# Patient Record
Sex: Female | Born: 1940 | Race: White | Hispanic: No | Smoking: Never smoker
Health system: Southern US, Community
[De-identification: ages and names within clinical notes are randomized; demographics above are authoritative.]

## PROBLEM LIST (undated history)

## (undated) DIAGNOSIS — R011 Cardiac murmur, unspecified: Secondary | ICD-10-CM

## (undated) DIAGNOSIS — C801 Malignant (primary) neoplasm, unspecified: Secondary | ICD-10-CM

## (undated) DIAGNOSIS — J939 Pneumothorax, unspecified: Secondary | ICD-10-CM

## (undated) DIAGNOSIS — Z66 Do not resuscitate: Secondary | ICD-10-CM

## (undated) DIAGNOSIS — E785 Hyperlipidemia, unspecified: Secondary | ICD-10-CM

## (undated) DIAGNOSIS — S7290XA Unspecified fracture of unspecified femur, initial encounter for closed fracture: Secondary | ICD-10-CM

## (undated) DIAGNOSIS — I10 Essential (primary) hypertension: Secondary | ICD-10-CM

## (undated) DIAGNOSIS — K219 Gastro-esophageal reflux disease without esophagitis: Secondary | ICD-10-CM

## (undated) DIAGNOSIS — K5730 Diverticulosis of large intestine without perforation or abscess without bleeding: Secondary | ICD-10-CM

## (undated) DIAGNOSIS — N200 Calculus of kidney: Secondary | ICD-10-CM

## (undated) DIAGNOSIS — G43909 Migraine, unspecified, not intractable, without status migrainosus: Secondary | ICD-10-CM

## (undated) DIAGNOSIS — H269 Unspecified cataract: Secondary | ICD-10-CM

## (undated) HISTORY — DX: Malignant (primary) neoplasm, unspecified: C80.1

## (undated) HISTORY — DX: Calculus of kidney: N20.0

## (undated) HISTORY — DX: Do not resuscitate: Z66

## (undated) HISTORY — PX: TONSILLECTOMY: SHX5217

## (undated) HISTORY — DX: Pneumothorax, unspecified: J93.9

## (undated) HISTORY — PX: FEMUR FRACTURE SURGERY: SHX633

## (undated) HISTORY — DX: Unspecified fracture of unspecified femur, initial encounter for closed fracture: S72.90XA

## (undated) HISTORY — DX: Migraine, unspecified, not intractable, without status migrainosus: G43.909

## (undated) HISTORY — DX: Diverticulosis of large intestine without perforation or abscess without bleeding: K57.30

## (undated) HISTORY — DX: Cardiac murmur, unspecified: R01.1

## (undated) HISTORY — DX: Essential (primary) hypertension: I10

## (undated) HISTORY — DX: Hyperlipidemia, unspecified: E78.5

## (undated) HISTORY — DX: Unspecified cataract: H26.9

## (undated) HISTORY — PX: CATARACT EXTRACTION: SUR2

## (undated) HISTORY — PX: POLYPECTOMY: SHX149

## (undated) HISTORY — PX: OOPHORECTOMY: SHX86

## (undated) HISTORY — DX: Gastro-esophageal reflux disease without esophagitis: K21.9

---

## 1992-04-06 HISTORY — PX: ABDOMINAL HYSTERECTOMY: SHX81

## 1998-01-16 ENCOUNTER — Encounter: Payer: Self-pay | Admitting: Endocrinology

## 1998-04-06 HISTORY — PX: CARPAL TUNNEL RELEASE: SHX101

## 1998-04-17 ENCOUNTER — Ambulatory Visit (HOSPITAL_BASED_OUTPATIENT_CLINIC_OR_DEPARTMENT_OTHER): Admission: RE | Admit: 1998-04-17 | Discharge: 1998-04-17 | Payer: Self-pay | Admitting: Orthopedic Surgery

## 1999-01-28 ENCOUNTER — Other Ambulatory Visit: Admission: RE | Admit: 1999-01-28 | Discharge: 1999-01-28 | Payer: Self-pay | Admitting: Gynecology

## 1999-04-07 DIAGNOSIS — C801 Malignant (primary) neoplasm, unspecified: Secondary | ICD-10-CM

## 1999-04-07 HISTORY — DX: Malignant (primary) neoplasm, unspecified: C80.1

## 1999-07-11 ENCOUNTER — Encounter: Admission: RE | Admit: 1999-07-11 | Discharge: 1999-07-11 | Payer: Self-pay | Admitting: Gynecology

## 1999-07-11 ENCOUNTER — Encounter: Payer: Self-pay | Admitting: Gynecology

## 1999-07-15 ENCOUNTER — Encounter: Admission: RE | Admit: 1999-07-15 | Discharge: 1999-07-15 | Payer: Self-pay | Admitting: Gynecology

## 1999-07-15 ENCOUNTER — Encounter: Payer: Self-pay | Admitting: Gynecology

## 2000-07-12 ENCOUNTER — Encounter: Payer: Self-pay | Admitting: Gynecology

## 2000-07-12 ENCOUNTER — Encounter: Admission: RE | Admit: 2000-07-12 | Discharge: 2000-07-12 | Payer: Self-pay | Admitting: Gynecology

## 2000-12-29 ENCOUNTER — Other Ambulatory Visit: Admission: RE | Admit: 2000-12-29 | Discharge: 2000-12-29 | Payer: Self-pay | Admitting: Gynecology

## 2001-07-14 ENCOUNTER — Encounter: Admission: RE | Admit: 2001-07-14 | Discharge: 2001-07-14 | Payer: Self-pay | Admitting: Gynecology

## 2001-07-14 ENCOUNTER — Encounter: Payer: Self-pay | Admitting: Gynecology

## 2002-01-04 ENCOUNTER — Other Ambulatory Visit: Admission: RE | Admit: 2002-01-04 | Discharge: 2002-01-04 | Payer: Self-pay | Admitting: Gynecology

## 2002-04-06 HISTORY — PX: BUNIONECTOMY: SHX129

## 2002-07-19 ENCOUNTER — Encounter: Payer: Self-pay | Admitting: Gynecology

## 2002-07-19 ENCOUNTER — Encounter: Admission: RE | Admit: 2002-07-19 | Discharge: 2002-07-19 | Payer: Self-pay | Admitting: Gynecology

## 2002-07-24 ENCOUNTER — Encounter: Payer: Self-pay | Admitting: Gynecology

## 2002-07-24 ENCOUNTER — Encounter: Admission: RE | Admit: 2002-07-24 | Discharge: 2002-07-24 | Payer: Self-pay | Admitting: Gynecology

## 2003-01-08 ENCOUNTER — Ambulatory Visit (HOSPITAL_BASED_OUTPATIENT_CLINIC_OR_DEPARTMENT_OTHER): Admission: RE | Admit: 2003-01-08 | Discharge: 2003-01-08 | Payer: Self-pay | Admitting: Orthopedic Surgery

## 2003-01-08 ENCOUNTER — Ambulatory Visit (HOSPITAL_COMMUNITY): Admission: RE | Admit: 2003-01-08 | Discharge: 2003-01-08 | Payer: Self-pay | Admitting: Orthopedic Surgery

## 2003-01-30 ENCOUNTER — Other Ambulatory Visit: Admission: RE | Admit: 2003-01-30 | Discharge: 2003-01-30 | Payer: Self-pay | Admitting: Gynecology

## 2003-07-20 ENCOUNTER — Encounter: Admission: RE | Admit: 2003-07-20 | Discharge: 2003-07-20 | Payer: Self-pay | Admitting: Gynecology

## 2003-09-24 ENCOUNTER — Encounter: Payer: Self-pay | Admitting: Internal Medicine

## 2004-02-01 ENCOUNTER — Other Ambulatory Visit: Admission: RE | Admit: 2004-02-01 | Discharge: 2004-02-01 | Payer: Self-pay | Admitting: Gynecology

## 2004-05-16 ENCOUNTER — Ambulatory Visit: Payer: Self-pay | Admitting: Cardiology

## 2004-05-28 ENCOUNTER — Ambulatory Visit: Payer: Self-pay | Admitting: Cardiology

## 2004-07-25 ENCOUNTER — Encounter: Admission: RE | Admit: 2004-07-25 | Discharge: 2004-07-25 | Payer: Self-pay | Admitting: Gynecology

## 2005-03-02 ENCOUNTER — Other Ambulatory Visit: Admission: RE | Admit: 2005-03-02 | Discharge: 2005-03-02 | Payer: Self-pay | Admitting: Gynecology

## 2005-04-02 ENCOUNTER — Ambulatory Visit: Payer: Self-pay | Admitting: Internal Medicine

## 2005-04-22 ENCOUNTER — Ambulatory Visit: Payer: Self-pay | Admitting: Internal Medicine

## 2005-05-26 ENCOUNTER — Ambulatory Visit: Payer: Self-pay | Admitting: Cardiology

## 2005-05-28 ENCOUNTER — Ambulatory Visit: Payer: Self-pay | Admitting: Cardiology

## 2005-07-28 ENCOUNTER — Encounter: Admission: RE | Admit: 2005-07-28 | Discharge: 2005-07-28 | Payer: Self-pay | Admitting: Gynecology

## 2006-03-16 ENCOUNTER — Other Ambulatory Visit: Admission: RE | Admit: 2006-03-16 | Discharge: 2006-03-16 | Payer: Self-pay | Admitting: Gynecology

## 2006-04-06 HISTORY — PX: LUNG SURGERY: SHX703

## 2006-06-01 ENCOUNTER — Ambulatory Visit: Payer: Self-pay | Admitting: Cardiology

## 2006-06-01 LAB — CONVERTED CEMR LAB
ALT: 37 units/L (ref 0–40)
AST: 27 units/L (ref 0–37)
Bilirubin, Direct: 0.2 mg/dL (ref 0.0–0.3)
Cholesterol: 152 mg/dL (ref 0–200)
HDL: 57.5 mg/dL (ref >39.0)
Total Bilirubin: 1.1 mg/dL (ref 0.3–1.2)
Total Protein: 6.7 g/dL (ref 6.0–8.3)
Triglycerides: 121 mg/dL (ref 0–149)

## 2006-06-10 ENCOUNTER — Ambulatory Visit: Payer: Self-pay | Admitting: Cardiology

## 2006-07-30 ENCOUNTER — Encounter: Admission: RE | Admit: 2006-07-30 | Discharge: 2006-07-30 | Payer: Self-pay | Admitting: Gynecology

## 2007-02-18 ENCOUNTER — Ambulatory Visit: Payer: Self-pay | Admitting: Pulmonary Disease

## 2007-02-18 ENCOUNTER — Inpatient Hospital Stay (HOSPITAL_COMMUNITY): Admission: EM | Admit: 2007-02-18 | Discharge: 2007-02-21 | Payer: Self-pay | Admitting: Emergency Medicine

## 2007-02-25 ENCOUNTER — Ambulatory Visit: Payer: Self-pay | Admitting: Internal Medicine

## 2007-02-28 ENCOUNTER — Ambulatory Visit: Payer: Self-pay | Admitting: Cardiology

## 2007-03-08 ENCOUNTER — Telehealth: Payer: Self-pay | Admitting: Internal Medicine

## 2007-03-16 ENCOUNTER — Ambulatory Visit: Payer: Self-pay | Admitting: Cardiology

## 2007-03-21 ENCOUNTER — Encounter: Payer: Self-pay | Admitting: Internal Medicine

## 2007-03-21 DIAGNOSIS — R930 Abnormal findings on diagnostic imaging of skull and head, not elsewhere classified: Secondary | ICD-10-CM | POA: Insufficient documentation

## 2007-04-02 ENCOUNTER — Ambulatory Visit: Payer: Self-pay | Admitting: Pulmonary Disease

## 2007-04-02 ENCOUNTER — Inpatient Hospital Stay (HOSPITAL_COMMUNITY): Admission: EM | Admit: 2007-04-02 | Discharge: 2007-04-08 | Payer: Self-pay | Admitting: Emergency Medicine

## 2007-04-04 ENCOUNTER — Encounter: Payer: Self-pay | Admitting: Thoracic Surgery (Cardiothoracic Vascular Surgery)

## 2007-04-04 ENCOUNTER — Encounter: Payer: Self-pay | Admitting: Internal Medicine

## 2007-04-05 ENCOUNTER — Ambulatory Visit: Payer: Self-pay | Admitting: Thoracic Surgery (Cardiothoracic Vascular Surgery)

## 2007-04-07 DIAGNOSIS — Z66 Do not resuscitate: Secondary | ICD-10-CM

## 2007-04-07 HISTORY — DX: Do not resuscitate: Z66

## 2007-04-11 ENCOUNTER — Telehealth (INDEPENDENT_AMBULATORY_CARE_PROVIDER_SITE_OTHER): Payer: Self-pay | Admitting: *Deleted

## 2007-04-12 ENCOUNTER — Ambulatory Visit: Payer: Self-pay | Admitting: Internal Medicine

## 2007-04-12 ENCOUNTER — Telehealth (INDEPENDENT_AMBULATORY_CARE_PROVIDER_SITE_OTHER): Payer: Self-pay | Admitting: *Deleted

## 2007-04-12 DIAGNOSIS — J209 Acute bronchitis, unspecified: Secondary | ICD-10-CM

## 2007-04-25 ENCOUNTER — Ambulatory Visit: Payer: Self-pay | Admitting: Thoracic Surgery (Cardiothoracic Vascular Surgery)

## 2007-04-25 ENCOUNTER — Encounter
Admission: RE | Admit: 2007-04-25 | Discharge: 2007-04-25 | Payer: Self-pay | Admitting: Thoracic Surgery (Cardiothoracic Vascular Surgery)

## 2007-05-16 ENCOUNTER — Ambulatory Visit: Payer: Self-pay | Admitting: Internal Medicine

## 2007-05-16 DIAGNOSIS — J930 Spontaneous tension pneumothorax: Secondary | ICD-10-CM

## 2007-05-16 DIAGNOSIS — J439 Emphysema, unspecified: Secondary | ICD-10-CM

## 2007-05-16 DIAGNOSIS — J984 Other disorders of lung: Secondary | ICD-10-CM

## 2007-05-16 DIAGNOSIS — J939 Pneumothorax, unspecified: Secondary | ICD-10-CM | POA: Insufficient documentation

## 2007-05-30 ENCOUNTER — Ambulatory Visit: Payer: Self-pay | Admitting: Cardiology

## 2007-05-30 LAB — CONVERTED CEMR LAB
ALT: 27 units/L (ref 0–35)
AST: 24 units/L (ref 0–37)
Albumin: 3.6 g/dL (ref 3.5–5.2)
Alkaline Phosphatase: 39 units/L (ref 39–117)
Total Bilirubin: 0.7 mg/dL (ref 0.3–1.2)
Total CHOL/HDL Ratio: 2.2

## 2007-06-07 ENCOUNTER — Ambulatory Visit: Payer: Self-pay | Admitting: Cardiology

## 2007-06-22 ENCOUNTER — Encounter: Payer: Self-pay | Admitting: Internal Medicine

## 2007-06-27 ENCOUNTER — Ambulatory Visit: Payer: Self-pay | Admitting: Internal Medicine

## 2007-08-04 ENCOUNTER — Encounter: Admission: RE | Admit: 2007-08-04 | Discharge: 2007-08-04 | Payer: Self-pay | Admitting: Gynecology

## 2007-09-19 DIAGNOSIS — G43909 Migraine, unspecified, not intractable, without status migrainosus: Secondary | ICD-10-CM | POA: Insufficient documentation

## 2007-09-19 DIAGNOSIS — K5730 Diverticulosis of large intestine without perforation or abscess without bleeding: Secondary | ICD-10-CM | POA: Insufficient documentation

## 2007-09-19 DIAGNOSIS — K219 Gastro-esophageal reflux disease without esophagitis: Secondary | ICD-10-CM

## 2007-09-21 ENCOUNTER — Ambulatory Visit: Payer: Self-pay | Admitting: Internal Medicine

## 2007-12-27 ENCOUNTER — Ambulatory Visit: Payer: Self-pay | Admitting: Internal Medicine

## 2007-12-28 ENCOUNTER — Encounter: Payer: Self-pay | Admitting: Adult Health

## 2008-01-02 ENCOUNTER — Encounter: Payer: Self-pay | Admitting: Internal Medicine

## 2008-05-29 ENCOUNTER — Ambulatory Visit: Payer: Self-pay | Admitting: Cardiology

## 2008-05-29 LAB — CONVERTED CEMR LAB
Bilirubin, Direct: 0.1 mg/dL (ref 0.0–0.3)
Cholesterol: 175 mg/dL (ref 0–200)
LDL Cholesterol: 89 mg/dL (ref 0–99)
Total CHOL/HDL Ratio: 2.8
Total Protein: 6.7 g/dL (ref 6.0–8.3)
VLDL: 23 mg/dL (ref 0–40)

## 2008-05-31 DIAGNOSIS — E785 Hyperlipidemia, unspecified: Secondary | ICD-10-CM

## 2008-06-06 ENCOUNTER — Ambulatory Visit: Payer: Self-pay | Admitting: Cardiology

## 2008-06-25 ENCOUNTER — Ambulatory Visit: Payer: Self-pay | Admitting: Internal Medicine

## 2008-07-30 ENCOUNTER — Ambulatory Visit: Payer: Self-pay | Admitting: Internal Medicine

## 2008-07-30 DIAGNOSIS — R0602 Shortness of breath: Secondary | ICD-10-CM | POA: Insufficient documentation

## 2008-08-01 ENCOUNTER — Ambulatory Visit: Payer: Self-pay | Admitting: Internal Medicine

## 2008-08-01 DIAGNOSIS — J029 Acute pharyngitis, unspecified: Secondary | ICD-10-CM

## 2008-08-02 ENCOUNTER — Telehealth (INDEPENDENT_AMBULATORY_CARE_PROVIDER_SITE_OTHER): Payer: Self-pay | Admitting: *Deleted

## 2008-08-06 ENCOUNTER — Encounter: Admission: RE | Admit: 2008-08-06 | Discharge: 2008-08-06 | Payer: Self-pay | Admitting: Gynecology

## 2008-08-06 ENCOUNTER — Telehealth: Payer: Self-pay | Admitting: Internal Medicine

## 2008-08-08 ENCOUNTER — Encounter: Admission: RE | Admit: 2008-08-08 | Discharge: 2008-08-08 | Payer: Self-pay | Admitting: Gynecology

## 2009-02-08 ENCOUNTER — Encounter: Admission: RE | Admit: 2009-02-08 | Discharge: 2009-02-08 | Payer: Self-pay | Admitting: Gynecology

## 2009-04-15 ENCOUNTER — Telehealth: Payer: Self-pay | Admitting: Internal Medicine

## 2009-05-06 ENCOUNTER — Telehealth: Payer: Self-pay | Admitting: Cardiology

## 2009-05-28 ENCOUNTER — Ambulatory Visit: Payer: Self-pay | Admitting: Cardiology

## 2009-05-29 LAB — CONVERTED CEMR LAB
AST: 29 units/L (ref 0–37)
Albumin: 4.1 g/dL (ref 3.5–5.2)
Alkaline Phosphatase: 52 units/L (ref 39–117)
Cholesterol: 150 mg/dL (ref 0–200)
LDL Cholesterol: 56 mg/dL (ref 0–99)
Total Protein: 6.7 g/dL (ref 6.0–8.3)
Triglycerides: 152 mg/dL — ABNORMAL HIGH (ref 0.0–149.0)

## 2009-06-07 ENCOUNTER — Ambulatory Visit: Payer: Self-pay | Admitting: Cardiology

## 2009-06-09 ENCOUNTER — Encounter: Payer: Self-pay | Admitting: Cardiology

## 2009-06-21 ENCOUNTER — Telehealth (INDEPENDENT_AMBULATORY_CARE_PROVIDER_SITE_OTHER): Payer: Self-pay | Admitting: *Deleted

## 2009-06-26 ENCOUNTER — Telehealth (INDEPENDENT_AMBULATORY_CARE_PROVIDER_SITE_OTHER): Payer: Self-pay | Admitting: *Deleted

## 2009-07-30 ENCOUNTER — Ambulatory Visit: Payer: Self-pay | Admitting: Internal Medicine

## 2009-08-08 ENCOUNTER — Encounter: Admission: RE | Admit: 2009-08-08 | Discharge: 2009-08-08 | Payer: Self-pay | Admitting: Gynecology

## 2010-02-11 ENCOUNTER — Encounter: Admission: RE | Admit: 2010-02-11 | Discharge: 2010-02-11 | Payer: Self-pay | Admitting: Gynecology

## 2010-04-08 ENCOUNTER — Encounter: Payer: Self-pay | Admitting: Internal Medicine

## 2010-04-08 ENCOUNTER — Encounter: Payer: Self-pay | Admitting: Cardiology

## 2010-04-18 ENCOUNTER — Encounter: Payer: Self-pay | Admitting: Internal Medicine

## 2010-04-28 DIAGNOSIS — M810 Age-related osteoporosis without current pathological fracture: Secondary | ICD-10-CM | POA: Insufficient documentation

## 2010-05-05 ENCOUNTER — Telehealth: Payer: Self-pay | Admitting: Cardiology

## 2010-05-08 NOTE — Letter (Signed)
Summary: [name] MD  [name] MD   Imported By: Lennie Odor 04/23/2010 15:04:43  _____________________________________________________________________  External Attachment:    Type:   Image     Comment:   External Document

## 2010-05-08 NOTE — Assessment & Plan Note (Signed)
Summary: f/u 1 yr///kp   Primary Provider/Referring Provider:  [name]  CC:  yearly follow up visit.  History of Present Illness: 70 year old never smoker, returning for follow-up of fibrocystic lung disease.  History of bullectomy and lung biopsy after recurrent spontaneous pneumothorax in 2008. No specific syndrome identified.  On some days she feels "tired".  Occasional sticking chest pain/ache left upper anterior chest.  She says it feels as if an adhesion is pulling. Weight gain. Stable exertional dyspnea while walking. Denies fever, sweats, adenopathy, leg edema, palpitation, purulent or bloody discharge. Occasional hormone hot flash. GERD w/u by Dr. Marina Goodell [facility] and ventilator/ life support discussion. She has DNR  06/25/08- Hx PTX, bronchitis, lung nodule More aware of dyspnea with exertion, brisk walking last couple of weeks. Denies chest pain, palpitation, fever, phlegm, edema. Sleeps ok but sometimes changes position to breathe more comfortably. has felt tighter left scapula.  August 01, 2008--Presents for an acute office visit. Complains that throat is very sore, painful to swallow. Only eating soft foods b/c painful. Had PFTs 2 days ago, feels like this caused this. Denies chest pain, dyspnea, orthopnea, hemoptysis, fever, n/v/d, edema, headache,cough, congesiton.   July 30, 2009- Hx PTX, bronchitis, lung nodule, (DNR) Not much trouble throught the winter except for the sore throat she was seen for in April. Doing very well in pollen season. Denies cough or wheeze. She is able to do what she wants to do. Very occasionally she notices a slight substernal twinge- not much. No routine respiratory meds. Thin walled cysts and bronchiolitis on lung bx 2008. PFT- FEV1 2.28/ 132%; FEV1/FVC 0.77; slight response to BD Normal TLC and DLCO.   Current Medications (verified): 1)  Simvastatin 20 Mg  Tabs (Simvastatin) .... Take 1 By Mouth Once Daily 2)  Multivitamins    Tabs (Multiple Vitamin) .... Take 1 By Mouth Once Daily 3)  Aspirin 81 Mg  Tabs (Aspirin) .... Take 1 By Mouth Once Daily 4)  Calcium Citrate 630 Mg Tabs (Calcium Citrate) .... Take 2 By Mouth Two Times A Day 5)  Magnesium 250 Mg Tabs (Magnesium) .... Take 1 By Mouth Two Times A Day 6)  Fish Oil 1000 Mg  Caps (Omega-3 Fatty Acids) .... Take 1 By Mouth Two Times A Day 7)  Vitamin D3 1000 Unit Caps (Cholecalciferol) .... Take 1 By Mouth Once Daily  Allergies (verified): No Known Drug Allergies  Past History:  Past Medical History: Last updated: 05/31/2008 DNR 2009 recurrent left pneumothorax Left thoracotomy/VATS bx/ pleurodesis  12/08 kidney stone GERD Current Problems:  HYPERLIPIDEMIA-MIXED (ICD-272.4) DIVERTICULOSIS, COLON (ICD-562.10) MIGRAINE HEADACHE (ICD-346.90) NEOPLASM, MALIGNANT, SKIN, FAMILY HX (ICD-V16.8) GERD (ICD-530.81) OTHER DISEASES OF LUNG NOT ELSEWHERE CLASSIFIED (ICD-518.89) SPONTANEOUS PNEUMOTHORAX (ICD-512.8) ACUTE BRONCHITIS (ICD-466.0) CHEST XRAY, ABNORMAL (ICD-793.1)  Past Surgical History: Last updated: 12/27/2007 Left lung VAT for biopsy and removal of blebs with pleurodesis hysterectomy- fibroids bunion surgery Carpal Tunnel Release Tonsillectomy  Family History: Last updated: 07/30/2008 Aunt smoked- emphysema, cva Mother died bone cancer Father died post-op pulm embolism diabetes- sister and grandmother youngest sister had uterine cancer oldest sister had melanoma  Social History: Last updated: 06/27/2007 Patient never smoked.  divorced, no  children  Risk Factors: Smoking Status: never (04/12/2007)  Review of Systems      See HPI  The patient denies anorexia, fever, weight loss, weight gain, vision loss, decreased hearing, hoarseness, chest pain, syncope, dyspnea on exertion, peripheral edema, prolonged cough, headaches, hemoptysis, and severe indigestion/heartburn.    Vital  Signs:  Patient profile:   70 year old  female Height:      60 inches Weight:      130.25 pounds BMI:     25.53 O2 Sat:      97 % on Room air Pulse rate:   71 / minute BP sitting:   124 / 80  (left arm) Cuff size:   regular  Vitals Entered By: Reynaldo Minium CMA (July 30, 2009 1:34 PM)  O2 Flow:  Room air  Physical Exam  Additional Exam:  GEN: A/Ox3; pleasant , NAD HEENT:  [place]/AT, , EACs-clear, TMs-wnl, NOSE-clear, THROAT-post pharynx mild redness.  NECK:  Supple w/ fair ROM; no JVD; normal carotid impulses w/o bruits; no thyromegaly or nodules palpated; no lymphadenopathy. RESP  Clear to P & A; w/o, wheezes/ rales/ or rhonchi. She had thought she had felt a small mobile chest wall nodule over the left lower anterior ribs recently, but couldn't demonstrate it today. CARD:  RRR, no m/r/g   GI:   Soft & nt; nml bowel sounds; no organomegaly or masses detected. [place]: Warm bil,  no calf tenderness edema, clubbing, pulses intact Neuro:  alert no focal deficits.    Impression & Recommendations:  Problem # 1:  OTHER DISEASES OF LUNG NOT ELSEWHERE CLASSIFIED (ICD-518.89)  Path had showed  thin walled cysts with bronchiolitis. 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 We discussed whether to repeat CT at some point. She is leery of the radiation and I accepted her point. Fortunately there has not been another pneumothorax. I will follow at long intervals. We agreeed to wait on CXR this visit.  Medications Added to Medication List This Visit: 1)  Calcium Citrate 630 Mg Tabs (calcium Citrate)  .... Take 2 by mouth two times a day 2)  Vitamin D3 1000 Unit Caps (Cholecalciferol) .... Take 1 by mouth once daily  Other Orders: Est. Patient Level III ([address])  Patient Instructions: 1)  Please schedule a follow-up appointment in 1 year. 2)  Call earlier if you have problems or questions.

## 2010-05-08 NOTE — Progress Notes (Signed)
   Walk in Patient Form Recieved " Pt left note for Dr. Daleen Squibb" forwarded to Message Nurse  [facility]  June 21, 2009 11:02 AM

## 2010-05-08 NOTE — Letter (Signed)
Summary: Dr. Gretta Cool  Dr. Gretta Cool   Imported By: Marylou Mccoy 04/29/2010 10:57:47  _____________________________________________________________________  External Attachment:    Type:   Image     Comment:   External Document

## 2010-05-08 NOTE — Progress Notes (Signed)
   Phone Note Outgoing Call   Call placed by: Scherrie Bateman, LPN,  June 26, 2009 8:36 AM Call placed to: Patient Summary of Call: PT DROPPED  OFF ARTICLE  AT OFFICE FOR DR WALL TO REVIEW RE CHOLESTEROL MEDS. PT QUESTIONING CHOICE OF MED MD HAS PT TAKING, AFTER DR WALL REVIEWED SIMVASTATIN IS THE DRUG OF CHOICE  FOR GETTING THE RESULTS NEEDED.CALL PLACED TO PT RE ABOVE AND IS AWARE.  Initial call taken by: Scherrie Bateman, LPN,  June 26, 2009 8:39 AM

## 2010-05-08 NOTE — Letter (Signed)
Summary: [facility] - Bone Density Report  Dr[facility] Leon[facility] - Bone Density Report   Imported By: Marylou Mccoy 04/29/2010 11:00:28  _____________________________________________________________________  External Attachment:    Type:   Image     Comment:   External Document

## 2010-05-08 NOTE — Letter (Signed)
Summary: Patients RX Question  Patients RX Question   Imported By: Roderic Ovens 07/23/2009 12:24:24  _____________________________________________________________________  External Attachment:    Type:   Image     Comment:   External Document

## 2010-05-08 NOTE — Progress Notes (Signed)
Summary: ? ABOUT DIAG  Phone Note Call from Patient   Caller: Patient Call For: YOUNG Summary of Call: PT WANT TO KNOW IF SHE HAS BEEN DIAG WITH COPD? Initial call taken by: Rickard Patience,  April 15, 2009 2:55 PM  Follow-up for Phone Call        Spoke with pt; dont see DX of COPD in problem list. Reynaldo Minium CMA  April 15, 2009 3:05 PM

## 2010-05-08 NOTE — Assessment & Plan Note (Signed)
Summary: LIPIDS/LABS PRIOR TO APPT/ANAS  Medications Added VITAMIN D3 1000 UNIT CAPS (CHOLECALCIFEROL) 1 cap two times a day      Allergies Added: NKDA  Visit Type:  1 yr f/u Primary Provider:  [name]  CC:  no cardiac complaints today.  History of Present Illness: [name] returns today for evaluation and management of her mixed hyperlipidemia.  She exercises almost every day. She is very careful with her diet. He is very compliant with her medication.  Her total cholesterol is 150, HDL 63.7, total cholesterol HDL ratio of 2, normal VLDL, triglycerides 152, and LDL of 56. Liver functions are normal.  She's having no angina or ischemic symptoms. She has a few questions about fish oil and what brand to take. She's also read about some strange unusual side effects with statins which I have dismissed  Current Medications (verified): 1)  Simvastatin 20 Mg  Tabs (Simvastatin) .... Take 1 By Mouth Once Daily 2)  Multivitamins   Tabs (Multiple Vitamin) .... Take 1 By Mouth Once Daily 3)  Aspirin 81 Mg  Tabs (Aspirin) .... Take 1 By Mouth Once Daily 4)  Calcium Citrate 630 Mg Tabs (Calcium Citrate) .... Take 2 By Mouth Once Daily 5)  Magnesium 250 Mg Tabs (Magnesium) .... Take 1 By Mouth Two Times A Day 6)  Fish Oil 1000 Mg  Caps (Omega-3 Fatty Acids) .... Take 1 By Mouth Two Times A Day 7)  Vitamin D3 1000 Unit Caps (Cholecalciferol) .[name].. 1 Cap Two Times A Day  Allergies (verified): No Known Drug Allergies  Past History:  Past Surgical History: Last updated: 12/27/2007 Left lung VAT for biopsy and removal of blebs with pleurodesis hysterectomy- fibroids bunion surgery Carpal Tunnel Release Tonsillectomy  Review of Systems       negative other than history of present illness  Vital Signs:  Patient profile:   70 year old female Height:      60 inches Weight:      128 pounds BMI:     25.09 Pulse rate:   73 / minute Pulse rhythm:   irregular BP sitting:   100 / 70   (left arm) Cuff size:   large  Vitals Entered By: Danielle Rankin, CMA (June 07, 2009 11:16 AM)  Physical Exam  General:  younger than stated age Head:  normocephalic and atraumatic Eyes:  PERRLA/EOM intact; conjunctiva and lids normal. Neck:  Neck supple, no JVD. No masses, thyromegaly or abnormal cervical nodes. Chest Maurice Fotheringham:  no deformities or breast masses noted Lungs:  Clear bilaterally to auscultation and percussion. Heart:  Non-displaced PMI, chest non-tender; regular rate and rhythm, S1, S2 without murmurs, rubs or gallops. Carotid upstroke normal, no bruit. Normal abdominal aortic size, no bruits. Femorals normal pulses, no bruits. Pedals normal pulses. No edema, no varicosities. Abdomen:  Bowel sounds positive; abdomen soft and non-tender without masses, organomegaly, or hernias noted. No hepatosplenomegaly. Msk:  Back normal, normal gait. Muscle strength and tone normal. Pulses:  pulses normal in all 4 extremities Extremities:  No clubbing or cyanosis. Neurologic:  Alert and oriented x 3. Skin:  Intact without lesions or rashes. Psych:  Normal affect.   EKG  Procedure date:  06/07/2009  Findings:      normal sinus rhythm, left axis deviation, no change.  Impression & Recommendations:  Problem # 1:  HYPERLIPIDEMIA-MIXED (ICD-272.4)  Her updated medication list for this problem includes:    Simvastatin 20 Mg Tabs (Simvastatin) .[name]... Take 1 by mouth once  daily  Other Orders: EKG w/ Interpretation (93000)  Patient Instructions: 1)  Your physician recommends that you schedule a follow-up appointment in: YEAR WITH DR Duvan Mousel 2)  Your physician recommends that you continue on your current medications as directed. Please refer to the Current Medication list given to you today.

## 2010-05-08 NOTE — Progress Notes (Signed)
Summary: rx simvastatin  Medications Added SIMVASTATIN 20 MG  TABS (SIMVASTATIN) take 1 by mouth once daily       Phone Note Refill Request Message from:  Patient on May 06, 2009 9:30 AM  Refills Requested: Medication #1:  SIMVASTATIN 20 MG  TABS take 1 by mouth once daily [facility] [phone]  Initial call taken by: Judie Grieve,  May 06, 2009 9:31 AM    New/Updated Medications: SIMVASTATIN 20 MG  TABS (SIMVASTATIN) take 1 by mouth once daily Prescriptions: SIMVASTATIN 20 MG  TABS (SIMVASTATIN) take 1 by mouth once daily  #30 x 11   Entered by:   Danielle Rankin, CMA   Authorized by:   Gaylord Shih, MD, [facility]   Signed by:   Danielle Rankin, CMA on 05/06/2009   Method used:   Faxed to ...       [facility] (retail)       [phone]       [place], [place]  [address]       Ph: [phone]       Fax: [phone]   RxID:   [id number]

## 2010-05-14 NOTE — Letter (Signed)
Summary: Gretta Cool MD  Gretta Cool MD   Imported By: Lester [place] 05/08/2010 09:31:47  _____________________________________________________________________  External Attachment:    Type:   Image     Comment:   External Document

## 2010-05-14 NOTE — Progress Notes (Signed)
Summary: RX SENT TO [facility]  Medications Added SIMVASTATIN 20 MG  TABS (SIMVASTATIN) take 1 by mouth once daily       Phone Note Refill Request Message from:  Patient on May 05, 2010 10:46 AM  Refills Requested: Medication #1:  SIMVASTATIN 20 MG  TABS take 1 by mouth once daily [facility]   Method Requested: Telephone to Pharmacy Initial call taken by: Glynda Jaeger,  May 05, 2010 10:46 AM    New/Updated Medications: SIMVASTATIN 20 MG  TABS (SIMVASTATIN) take 1 by mouth once daily Prescriptions: SIMVASTATIN 20 MG  TABS (SIMVASTATIN) take 1 by mouth once daily  #30 x 11   Entered by:   Danielle Rankin, CMA   Authorized by:   Gaylord Shih, MD, [facility]   Signed by:   Danielle Rankin, CMA on 05/05/2010   Method used:   Faxed to ...       [facility] (retail)       [phone]       [place], [place]  [address]       Ph: [phone]       Fax: [phone]   RxID:   [id number]

## 2010-05-21 ENCOUNTER — Encounter: Payer: Self-pay | Admitting: Cardiology

## 2010-05-28 NOTE — Letter (Signed)
Summary: [facility]  [facility]   Imported By: Marylou Mccoy 05/21/2010 14:39:57  _____________________________________________________________________  External Attachment:    Type:   Image     Comment:   External Document

## 2010-06-11 ENCOUNTER — Other Ambulatory Visit (INDEPENDENT_AMBULATORY_CARE_PROVIDER_SITE_OTHER): Payer: Medicare Other

## 2010-06-11 ENCOUNTER — Encounter: Payer: Self-pay | Admitting: Cardiology

## 2010-06-11 ENCOUNTER — Other Ambulatory Visit: Payer: Self-pay | Admitting: Cardiology

## 2010-06-11 DIAGNOSIS — E785 Hyperlipidemia, unspecified: Secondary | ICD-10-CM

## 2010-06-11 LAB — LIPID PANEL
Cholesterol: 145 mg/dL (ref 0–200)
HDL: 59.5 mg/dL (ref >39.00)
VLDL: 13 mg/dL (ref 0.0–40.0)

## 2010-06-11 LAB — HEPATIC FUNCTION PANEL
Albumin: 4.2 g/dL (ref 3.5–5.2)
Alkaline Phosphatase: 42 U/L (ref 39–117)

## 2010-06-19 ENCOUNTER — Encounter: Payer: Self-pay | Admitting: Cardiology

## 2010-06-19 ENCOUNTER — Ambulatory Visit (INDEPENDENT_AMBULATORY_CARE_PROVIDER_SITE_OTHER): Payer: Medicare Other | Admitting: Cardiology

## 2010-06-19 DIAGNOSIS — E119 Type 2 diabetes mellitus without complications: Secondary | ICD-10-CM | POA: Insufficient documentation

## 2010-06-19 DIAGNOSIS — I10 Essential (primary) hypertension: Secondary | ICD-10-CM | POA: Insufficient documentation

## 2010-06-19 DIAGNOSIS — E785 Hyperlipidemia, unspecified: Secondary | ICD-10-CM

## 2010-06-20 ENCOUNTER — Telehealth: Payer: Self-pay | Admitting: Cardiology

## 2010-06-24 NOTE — Progress Notes (Addendum)
Summary: question re yesterday appt   Phone Note Call from Patient Call back at Home Phone [phone]   Caller: Patient Reason for Call: Talk to Nurse Summary of Call: pt has question re office visit yesterday. Initial call taken by: Roe Coombs,  June 20, 2010 10:17 AM  Follow-up for Phone Call        Pt needed medication for 90 day called in and added new medication to list. Follow-up by: Lisabeth Devoid RN,  June 20, 2010 5:50 PM    Prescriptions: SIMVASTATIN 20 MG  TABS (SIMVASTATIN) take 1 by mouth once daily  #90 x 3   Entered by:   Lisabeth Devoid RN   Authorized by:   Gaylord Shih, MD, [facility]   Signed by:   Lisabeth Devoid RN on 06/20/2010   Method used:   Faxed to ...       [facility] (retail)       [phone] [address]       [place], [place]  [address]       Ph: [phone]       Fax: [phone]   RxID:   [id number]

## 2010-06-24 NOTE — Assessment & Plan Note (Signed)
Summary: yearly/cy/ per pt call-mj/hm  Medications Added CITRACAL CALCIUM+D 600-40-500 MG-MG-UNIT XR24H-TAB (CALCIUM-MAGNESIUM-VITAMIN D) 2 tablet daily FISH OIL 1000 MG  CAPS (OMEGA-3 FATTY ACIDS) take 2daily VITAMIN D3 1000 UNIT CAPS (CHOLECALCIFEROL) take 2 by mouth once daily LISINOPRIL 10 MG TABS (LISINOPRIL) Take one tablet by mouth daily      Allergies Added: NKDA  Visit Type:  Follow-up Primary Provider:  [name]  CC:  worried about DM2 and and high BP.  History of Present Illness: [name] returns for treatment of her neww dx of HTN and DM2. This was picked up by Dr Nicholas Lose. Her HbA1c was 6.6. Since then she has lost 15 lbs and has faithfully checked her BS. AC values are essentially norma. Her BP is still high.  Last lipids were at goal.  She has not started Metformin.  EKG is normal.    Current Medications (verified): 1)  Simvastatin 20 Mg  Tabs (Simvastatin) .... Take 1 By Mouth Once Daily 2)  Multivitamins   Tabs (Multiple Vitamin) .... Take 1 By Mouth Once Daily 3)  Aspirin 81 Mg  Tabs (Aspirin) .... Take 1 By Mouth Once Daily 4)  Citracal Calcium+d 600-40-500 Mg-Mg-Unit Xr24h-Tab (Calcium-Magnesium-Vitamin D) .... 2 Tablet Daily 5)  Magnesium 250 Mg Tabs (Magnesium) .... Take 1 By Mouth Two Times A Day 6)  Fish Oil 1000 Mg  Caps (Omega-3 Fatty Acids) .... Take 1daily 7)  Vitamin D3 1000 Unit Caps (Cholecalciferol) .... Take 2 By Mouth Once Daily 8)  Metformin Hcl 500 Mg Tabs (Metformin Hcl) .... (On Hold) 1-Take Tablet Daily Then Increase To 2 Tablet Two Times A Day  Allergies (verified): No Known Drug Allergies  Past History:  Past Medical History: Last updated: 04/28/2010 DNR 2009 recurrent left pneumothorax Left thoracotomy/VATS bx/ pleurodesis  12/08 kidney stone GERD Current Problems:  HYPERLIPIDEMIA-MIXED (ICD-272.4) DIVERTICULOSIS, COLON (ICD-562.10) MIGRAINE HEADACHE (ICD-346.90) NEOPLASM, MALIGNANT, SKIN, FAMILY HX (ICD-V16.8) GERD  (ICD-530.81) OTHER DISEASES OF LUNG NOT ELSEWHERE CLASSIFIED (ICD-518.89) SPONTANEOUS PNEUMOTHORAX (ICD-512.8) ACUTE BRONCHITIS (ICD-466.0) CHEST XRAY, ABNORMAL (ICD-793.1) Osteoporosis- Dr Nicholas Lose 2012  Past Surgical History: Last updated: 12/27/2007 Left lung VAT for biopsy and removal of blebs with pleurodesis hysterectomy- fibroids bunion surgery Carpal Tunnel Release Tonsillectomy  Family History: Last updated: 07/30/2008 Aunt smoked- emphysema, cva Mother died bone cancer Father died post-op pulm embolism diabetes- sister and grandmother youngest sister had uterine cancer oldest sister had melanoma  Social History: Last updated: 06/27/2007 Patient never smoked.  divorced, no  children  Risk Factors: Smoking Status: never (04/12/2007)  Review of Systems       negative other than HPI  Vital Signs:  Patient profile:   70 year old female Height:      60 inches Weight:      116.50 pounds BMI:     22.83 Pulse rate:   74 / minute BP sitting:   148 / 78  (left arm) Cuff size:   regular  Vitals Entered By: Caralee Ates CMA (June 19, 2010 11:29 AM)  Physical Exam  General:  Well developed, well nourished, in no acute distress. Head:  normocephalic and atraumatic Eyes:  PERRLA/EOM intact; conjunctiva and lids normal. Neck:  Neck supple, no JVD. No masses, thyromegaly or abnormal cervical nodes. Chest Toniesha Zellner:  no deformities or breast masses noted Lungs:  Clear bilaterally to auscultation and percussion. Heart:  Non-displaced PMI, chest non-tender; regular rate and rhythm, S1, S2 without murmurs, rubs or gallops. Carotid upstroke normal, no bruit. Normal abdominal aortic size, no bruits.  Femorals normal pulses, no bruits. Pedals normal pulses. No edema, no varicosities. Msk:  Back normal, normal gait. Muscle strength and tone normal. Pulses:  pulses normal in all 4 extremities Extremities:  No clubbing or cyanosis. Neurologic:  Alert and oriented x 3. Skin:  Intact  without lesions or rashes. Psych:  Normal affect.   Impression & Recommendations:  Problem # 1:  UNSPECIFIED ESSENTIAL HYPERTENSION (ICD-401.9) Assessment New  Begin ACEI. Followup blood work Her updated medication list for this problem includes:    Aspirin 81 Mg Tabs (Aspirin) .[name]... Take 1 by mouth once daily    Lisinopril 10 Mg Tabs (Lisinopril) .[name]... Take one tablet by mouth daily  Her updated medication list for this problem includes:    Aspirin 81 Mg Tabs (Aspirin) .[name]... Take 1 by mouth once daily    Lisinopril 10 Mg Tabs (Lisinopril) .[name]... Take one tablet by mouth daily  Problem # 2:  DIABETES MELLITUS (ICD-250.00) Assessment: New  She has done a remarkable job controlling this with weight loss and exercise. Advised not to start metformin. [facility] referral. Her updated medication list for this problem includes:    Aspirin 81 Mg Tabs (Aspirin) .[name]... Take 1 by mouth once daily    Lisinopril 10 Mg Tabs (Lisinopril) .[name]... Take one tablet by mouth daily  Her updated medication list for this problem includes:    Aspirin 81 Mg Tabs (Aspirin) .[name]... Take 1 by mouth once daily    Metformin Hcl 500 Mg Tabs (Metformin hcl) ..... (on hold) 1-take tablet daily then increase to 2 tablet two times a day    Lisinopril 10 Mg Tabs (Lisinopril) .[name]... Take one tablet by mouth daily  Problem # 3:  HYPERLIPIDEMIA-MIXED (ICD-272.4) Assessment: Improved  Her updated medication list for this problem includes:    Simvastatin 20 Mg Tabs (Simvastatin) .[name]... Take 1 by mouth once daily  Orders: EKG w/ Interpretation (93000) Primary Care Referral (Primary)  Her updated medication list for this problem includes:    Simvastatin 20 Mg Tabs (Simvastatin) .[name]... Take 1 by mouth once daily  Patient Instructions: 1)  Your physician recommends that you schedule a follow-up appointment in: 6 months with Dr. Daleen Squibb 2)  Your physician recommends that you return for lab work on July 09, 2010 for a bmet,HbA1C    3)  Your physician has recommended you make the following change in your medication: Start Lisinopril 4)  You have been referred to Dr. Caryl Never for a primary care md. 5)  Your physician has requested that you regularly monitor and record your blood pressure readings at home.  Please use the same machine at the same time of day to check your readings and record them to bring to your follow-up visit. Goal bp is less than 130/80 Prescriptions: SIMVASTATIN 20 MG  TABS (SIMVASTATIN) take 1 by mouth once daily  #30 x 11   Entered by:   Lisabeth Devoid RN   Authorized by:   Gaylord Shih, MD, [facility]   Signed by:   Lisabeth Devoid RN on 06/19/2010   Method used:   Faxed to ...       [facility] (retail)       [phone]       [place], [place]  [address]       Ph: [phone]       Fax: [phone]   RxID:   [id number] LISINOPRIL 10 MG TABS (LISINOPRIL) Take one tablet by  mouth daily  #90 x 3   Entered by:   Lisabeth Devoid RN   Authorized by:   Gaylord Shih, MD, [facility]   Signed by:   Lisabeth Devoid RN on 06/19/2010   Method used:   Faxed to ...       [facility] (retail)       [phone] [address]       [place], [place]  [address]       Ph: [phone]       Fax: [phone]   RxID:   [id number]

## 2010-07-07 ENCOUNTER — Encounter: Payer: Medicare Other | Attending: Cardiology | Admitting: *Deleted

## 2010-07-07 DIAGNOSIS — Z713 Dietary counseling and surveillance: Secondary | ICD-10-CM | POA: Insufficient documentation

## 2010-07-07 DIAGNOSIS — E119 Type 2 diabetes mellitus without complications: Secondary | ICD-10-CM | POA: Insufficient documentation

## 2010-07-09 ENCOUNTER — Other Ambulatory Visit (INDEPENDENT_AMBULATORY_CARE_PROVIDER_SITE_OTHER): Payer: Medicare Other | Admitting: *Deleted

## 2010-07-09 ENCOUNTER — Telehealth: Payer: Self-pay | Admitting: *Deleted

## 2010-07-09 DIAGNOSIS — I10 Essential (primary) hypertension: Secondary | ICD-10-CM

## 2010-07-09 DIAGNOSIS — E119 Type 2 diabetes mellitus without complications: Secondary | ICD-10-CM

## 2010-07-09 LAB — BASIC METABOLIC PANEL
BUN: 14 mg/dL (ref 6–23)
Calcium: 9.9 mg/dL (ref 8.4–10.5)
GFR: 86.57 mL/min (ref >60.00)
Glucose, Bld: 88 mg/dL (ref 70–99)

## 2010-07-09 NOTE — Telephone Encounter (Signed)
Pt aware Dr. Daleen Squibb has reviewed bp log which pt brought in today.  She will begin taking the increased dose of Lisinopril  and check bp once daily at least 1 hour after taking medication and after resting for 15 minutes. Mylo Red RN

## 2010-07-15 ENCOUNTER — Other Ambulatory Visit: Payer: Self-pay | Admitting: Gynecology

## 2010-07-15 DIAGNOSIS — Z1231 Encounter for screening mammogram for malignant neoplasm of breast: Secondary | ICD-10-CM

## 2010-07-18 ENCOUNTER — Encounter: Payer: Self-pay | Admitting: Family Medicine

## 2010-07-18 ENCOUNTER — Ambulatory Visit (INDEPENDENT_AMBULATORY_CARE_PROVIDER_SITE_OTHER): Payer: Medicare Other | Admitting: Family Medicine

## 2010-07-18 VITALS — BP 130/80 | HR 72 | Temp 98.0°F | Resp 12 | Ht 59.75 in | Wt 112.0 lb

## 2010-07-18 DIAGNOSIS — E785 Hyperlipidemia, unspecified: Secondary | ICD-10-CM

## 2010-07-18 DIAGNOSIS — I10 Essential (primary) hypertension: Secondary | ICD-10-CM

## 2010-07-18 DIAGNOSIS — E119 Type 2 diabetes mellitus without complications: Secondary | ICD-10-CM

## 2010-07-18 NOTE — Progress Notes (Signed)
  Subjective:    Patient ID: [name], female    DOB: January 17, 1941, 70 y.o.   MRN: [id number]  HPI Patient seen to establish care. Past medical history reviewed. She has history of hyperlipidemia, remote history of migraine headaches, previous pneumothorax, GERD, hypertension, and recently diagnosed type 2 diabetes. She is managed with lifestyle modification. Exercising and has lost about 20 pounds. Initial A1c 6.6% now 5.7%. Fasting blood sugar stable. Taking no medications for diabetes.  She has scaled back on CHOs but no drastic diet changes.  Other medications reviewed. Recent initiation of lisinopril 10 mg daily. No cough or other side effects. Blood pressures stable but home readings with most readings less than 130/80  Family history significant for sister with type 2 diabetes. Mother had myelodysplastic anemia.  Patient is divorced. She is retired. No history of smoking. No alcohol use.   Review of Systems  Constitutional: Negative for fever, activity change, appetite change and fatigue.  HENT: Negative for hearing loss, ear pain, sore throat and trouble swallowing.   Eyes: Negative for visual disturbance.  Respiratory: Negative for cough and shortness of breath.   Cardiovascular: Negative for chest pain and palpitations.  Gastrointestinal: Negative for abdominal pain, diarrhea, constipation and blood in stool.  Genitourinary: Negative for dysuria and hematuria.  Musculoskeletal: Negative for myalgias, back pain and arthralgias.  Skin: Negative for rash.  Neurological: Negative for dizziness, syncope and headaches.  Hematological: Negative for adenopathy.  Psychiatric/Behavioral: Negative for confusion and dysphoric mood.       Objective:   Physical Exam  Constitutional: She is oriented to person, place, and time. She appears well-developed and well-nourished.  HENT:  Head: Normocephalic and atraumatic.  Eyes: EOM are normal. Pupils are equal, round, and reactive to light.    Neck: Normal range of motion. Neck supple. No thyromegaly present.  Cardiovascular: Normal rate, regular rhythm and normal heart sounds.   No murmur heard. Pulmonary/Chest: Breath sounds normal. No respiratory distress. She has no wheezes. She has no rales.  Abdominal: Soft. Bowel sounds are normal. She exhibits no distension and no mass. There is no tenderness. There is no rebound and no guarding.  Musculoskeletal: Normal range of motion. She exhibits no edema.  Lymphadenopathy:    She has no cervical adenopathy.  Neurological: She is alert and oriented to person, place, and time. No cranial nerve deficit.  Skin: No rash noted.  Psychiatric: She has a normal mood and affect. Her behavior is normal. Judgment and thought content normal.          Assessment & Plan:  #1 type 2 diabetes well controlled by home readings and lifestyle management. Continue to monitor. Reassess A1c 3 months and may eventually go to every 6 months #2 history of dyslipidemia #3 hypertension well controlled

## 2010-07-20 ENCOUNTER — Encounter: Payer: Self-pay | Admitting: Family Medicine

## 2010-07-28 ENCOUNTER — Encounter: Payer: Self-pay | Admitting: Internal Medicine

## 2010-07-30 ENCOUNTER — Encounter: Payer: Self-pay | Admitting: Internal Medicine

## 2010-07-30 ENCOUNTER — Ambulatory Visit (INDEPENDENT_AMBULATORY_CARE_PROVIDER_SITE_OTHER)
Admission: RE | Admit: 2010-07-30 | Discharge: 2010-07-30 | Disposition: A | Discharge status: Home / Self Care | Payer: Medicare Other | Source: Ambulatory Visit | Attending: Internal Medicine | Admitting: Internal Medicine

## 2010-07-30 ENCOUNTER — Ambulatory Visit (INDEPENDENT_AMBULATORY_CARE_PROVIDER_SITE_OTHER): Payer: Medicare Other | Admitting: Internal Medicine

## 2010-07-30 VITALS — BP 118/66 | HR 62 | Ht 60.0 in | Wt 112.0 lb

## 2010-07-30 DIAGNOSIS — J930 Spontaneous tension pneumothorax: Secondary | ICD-10-CM

## 2010-07-30 DIAGNOSIS — J984 Other disorders of lung: Secondary | ICD-10-CM

## 2010-07-30 NOTE — Progress Notes (Signed)
  Subjective:    Patient ID: [name], female    DOB: 1940-12-06, 70 y.o.   MRN: [id number]  HPI 23 yoF never smoker, followed for a diffuse fibrocystic lung condition, out of age range for LAM, but not identified, Hx of bullectomy and lung bx after spontaneous PTX 2008. [name] Complicated by hx acute bronchitis, GERD. Last here July 30, 2009- notes reviewed. She denies any significant events since then. No colds. Occasional mild rhinorhea or congestion blamed on pollen season and managed with Zyrtec. Rarely residual "sticking pains" and she denies dyspnea or any awareness of lung problems. Post thoracotomy numbness is gone.  We went back, reviewed and discussed her path report from 2009.  Told at [facility] this may be congential. Now diabetic- diet controlled.   Review of Systems See HPI  Constitutional:   No weight loss, night sweats,  Fevers, chills, fatigue, lassitude. HEENT:   No headaches,  Difficulty swallowing,  Tooth/dental problems,  Sore throat,                No sneezing, itching, ear ache, nasal congestion, post nasal drip,   CV:  No chest pain,  Orthopnea, PND, swelling in lower extremities, anasarca, dizziness, palpitations  GI  No heartburn, indigestion, abdominal pain, nausea, vomiting, diarrhea, change in bowel habits, loss of appetite  Resp: No shortness of breath with exertion or at rest.  No excess mucus, no productive cough,  No non-productive cough,  No coughing up of blood.  No change in color of mucus.  No wheezing.  Skin: no rash or lesions.  GU: no dysuria, change in color of urine, no urgency or frequency.  No flank pain.  MS:  No joint pain or swelling.  No decreased range of motion.  No back pain.  Psych:  No change in mood or affect. No depression or anxiety.  No memory loss.    Objective:   Physical Exam General- Alert, Oriented, Affect-appropriate, Distress- none acute  wdwn  Skin- rash-none, lesions- none, excoriation- none  Lymphadenopathy- none  Head-  atraumatic  Eyes- Gross vision intact, PERRLA, conjunctivae clear secretions  Ears- -Normal-Hearing, canals, Tm L , R ,  Nose- Clear,  No-Septal dev, mucus, polyps, erosion, perforation   Throat- Mallampati II , mucosa clear , drainage- none, tonsils- atrophic  Neck- flexible , trachea midline, no stridor , thyroid nl, carotid no bruit  Chest - symmetrical excursion , unlabored     Heart/CV- RRR , no murmur , no gallop  , no rub, nl s1 s2                     - JVD- none , edema- none, stasis changes- none, varices- none     Lung- clear to P&A, wheeze- none, cough- none , dullness-none, rub- none     Chest wall-  Abd- tender-no, distended-no, bowel sounds-present, HSM- no  Br/ Gen/ Rectal- Not done, not indicated  Extrem- cyanosis- none, clubbing, none, atrophy- none, strength- nl  Neuro- grossly intact to observation         Assessment & Plan:

## 2010-07-30 NOTE — Assessment & Plan Note (Signed)
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 As long as it doesn't bother her and no treatment is evident, there is not much for [place] to do except an occasional CXR.

## 2010-07-30 NOTE — Patient Instructions (Signed)
Order- CXR  Dx undefined lung condition  Please call as needed.

## 2010-07-31 ENCOUNTER — Encounter: Payer: Self-pay | Admitting: Internal Medicine

## 2010-08-06 ENCOUNTER — Telehealth: Payer: Self-pay | Admitting: Internal Medicine

## 2010-08-06 NOTE — Telephone Encounter (Signed)
cxr- lungs look stable. There are old surgery changes and a little nodular scarring adjacent to that, but nothing progressive. There are some old inflamatory changes with some calcium deposit on the lung lining, but this is old.      Spoke w/ pt and she is aware of cxr results. Pt verbalized understanding and had no questions

## 2010-08-13 ENCOUNTER — Ambulatory Visit
Admission: RE | Admit: 2010-08-13 | Discharge: 2010-08-13 | Disposition: A | Discharge status: Home / Self Care | Payer: Medicare Other | Source: Ambulatory Visit | Attending: Gynecology | Admitting: Gynecology

## 2010-08-13 DIAGNOSIS — Z1231 Encounter for screening mammogram for malignant neoplasm of breast: Secondary | ICD-10-CM

## 2010-08-19 NOTE — H&P (Signed)
NAME:  [name], [name] NO.:  [id number]   MEDICAL RECORD NO.:  [id number]          PATIENT TYPE:  EMS   LOCATION:  MAJO                         FACILITY:  [facility]   PHYSICIAN:  Felipa Evener, MD  DATE OF BIRTH:  Jan 04, 1941   DATE OF ADMISSION:  02/18/2007  DATE OF DISCHARGE:                              HISTORY & PHYSICAL   REFERRING PHYSICIAN:  Emergency department physician at [facility].   CHIEF COMPLAINT:  Shortness of breath and cough for 2 weeks with a 10%  left pneumothorax.   HISTORY OF PRESENT ILLNESS:  [name] is a 70 year old white female  never smoker, who is not normally short of breath with any activities of  daily living.  She noticed a cough that started approximately 2 weeks  ago and progressive dyspnea on exertion over the last week.  She is  still able to perform her activities of daily living, but notices she is  limited.  She is now short of breath walking greater than 100 yards.  She presented to the urgent care at [facility] today.  Chest x-  ray revealed approximately 10% left pneumothorax.  For that reason,  pulmonary was called to evaluate and treat her for left pneumothorax.   PAST MEDICAL HISTORY:  1. Significant for hypercholesterolemia treated with Lipitor.  2. Postmenopausal treated with estrogen patch.   SOCIAL HISTORY:  She has never smoked.  She is divorced.  Has no noxious  chemical exposures in the past.   FAMILY HISTORY:  Father is deceased at 59 with head/neck cancer.  Mother  with lung cancer.   ALLERGIES:  NONE.   MEDICATIONS:  1. She is on a statin.  2. Multivitamins.  3. Omega-3 fish oil.   PHYSICAL EXAMINATION:  VITAL SIGNS:  Sats are 94% on room air, blood  pressure 139/85, pulse 75, respiratory rate 16.  GENERAL:  Well-nourished, well developed white female in no acute  distress at rest.  HEENT:  No cervical adenopathy is appreciated.  Unremarkable oral  nasopharynx.  No JVD.  CHEST:  Clear to auscultation.  Chest x-ray does reveal 10% left  pneumothorax.  ABDOMEN:  Soft, nontender.  Positive bowel sounds.  GU:  She voids.  EXTREMITIES:  Warm without edema.   LABORATORY DATA:  Sodium 138, potassium 4.3, chloride 105, CO2 27, BUN  15, creatinine 0.8, glucose 86, hemoglobin 15.6, hematocrit 46, WBC 7.7,  platelets 334.   IMPRESSION/PLAN:  Spontaneous left 10% pneumothorax, questionable  etiology.  A.  Therefore, a chest tube will be done to evaluate any sequestered  cause of her pneumothorax.  B.  Once CT is completed, re-expansion with either a chest tube versus a  pigtailed catheter will be undertaken either by the pulmonary division  or interventional radiology.  C.  She will be admitted to the hospital for at least 24 hours for  further evaluation and treatment once the lung is re-expanded.  Once the  lung has been re-expanded, she can be either discharged home with a  pigtailed catheter or remain in the  hospital for another 24-48 hours  where she is evaluated with subsequent removal of chest tube.      Devra Dopp, MSN, ACNP      Felipa Evener, MD  Electronically Signed    SM/MEDQ  D:  02/18/2007  T:  02/18/2007  Job:  [id number]

## 2010-08-19 NOTE — Discharge Summary (Signed)
NAME:  [name], [name] NO.:  [id number]   MEDICAL RECORD NO.:  [id number]          PATIENT TYPE:  INP   LOCATION:  6739                         FACILITY:  [facility]   PHYSICIAN:  Felipa Evener, MD  DATE OF BIRTH:  [birth year]   DATE OF ADMISSION:  02/18/2007  DATE OF DISCHARGE:  02/21/2007                               DISCHARGE SUMMARY   DISCHARGE DIAGNOSIS:  1. Spontaneous left pneumothorax.  2. Status post re-expansion with pigtail pleural catheter.   LABORATORY DATA:  On February 19, 2007, white blood cell count 11.8,  hemoglobin 13.9, hematocrit 40.4, platelet count 306, alpha-I  antitrypsin 117.   RADIOLOGY:  Chest x-ray status post pain pigtail Pleurx catheter  placement, and transitioned to water seal demonstrating no residual left  pneumothorax.   BRIEF HISTORY:  [name] is a 70 year old white female who never smoked  and who is not normally short of breath with any activities of daily  living.  She developed a cough that started approximately 2 weeks prior  to admission with progressive dyspnea on exertion over the last week  prior to presentation.  She was still able to perform her activities of  daily living, but noticed that she was quite limited.  She was short of  breath, walking greater than 100 yards.  She presented to the Urgent  Care at [facility] with a chest x-ray that demonstrated  approximately 10% left pneumothorax.  For that reason, a pulmonary team  was asked to evaluate.  A CT of the chest was obtained that demonstrated  a large left pneumothorax with a small associated effusion with  virtually the entire left lower lobe collapsed.   HOSPITAL COURSE:  #1 - STATUS POST SPONTANEOUS LEFT PNEUMOTHORAX WITH  SUCCESSFUL RESOLUTION FOLLOWING PIGTAIL THORACOSTOMY DRAINAGE.  [name]  was seen and evaluated by Dr. Molli Knock in the emergency room at [facility].  After CT evaluation a small pigtail catheter was inserted at  the bedside, resulting in almost immediate relief of dyspneic symptoms.  She remained in the hospital over the following 48 hours with serial  chest x-rays demonstrating resolution of pneumothorax.  The chest tube  was initially placed to suction, then advance to water seal, following  chest x-ray with no chest x-ray obtained while the patient was on water  seal, which demonstrated no residual pneumothorax.  The pigtail catheter  was removed.  At this point, she will be discharged home with outpatient  follow-up primarily for follow-up chest x-ray evaluation.   DISCHARGE INSTRUCTIONS:  1. Diet: As tolerated.  2. Activity: Increase as tolerated.   1. She is instructed to sponge bathe, and keep dressing on until      evaluated by Dr. Maple Hudson in our office.  She has follow-up with Dr.      Jetty Duhamel on Friday, November 21st, at which time she will have      follow-up chest x-ray to ensure that the pneumothorax remains      resolved.  2. Additional discharge instructions include the following:   DISCHARGE  MEDICATIONS:  1. Lipitor 10 mg p.o. daily.  2. Vivelle patch 0.0375 as directed.  3. Aspirin 81 mg daily.  4. Percocet one tablet as needed every 4-6 hours p.r.n. pain.   FOLLOWUP:  She was instructed to call the office should she develop  fever, chills, or drainage from chest tube site.  Additionally, she was  told to report to the nearest emergency room should she develop  increased shortness of breath, or pleuritic-type chest pain.  Additionally, she was told to feel free to report to the emergency room,  should she develop fever, chills, or drainage and was unable to contact  a representative from our practice.  Additionally, she was told to keep  the current chest tube dressing clean, dry and intact, and not to remove  the dressing for a minimally 72 hours.  Should the dressing come off  after that point, she can dress it with a normal Band-Aid after 72  hours.  [name] has  now reached maximum benefit from inpatient stay.  The remainder of her follow-up will be completed in the outpatient  setting.      Zenia Resides, NP      Marius Ditch Jefm Miles, MD  Electronically Signed    PB/MEDQ  D:  02/21/2007  T:  02/22/2007  Job:  [id number]

## 2010-08-19 NOTE — Assessment & Plan Note (Signed)
[facility]                            CARDIOLOGY OFFICE NOTE   [name], [name]                          MRN:          [id number]  DATE:06/07/2007                            DOB:          1941/01/09    [name] returns today for further management of her mixed  hyperlipidemia.   Unfortunately, she has been back in the hospital with another  spontaneous pneumothorax since I last saw her.   Per her request, we switched her from Lipitor to simvastatin 20 mg a  day.  She is on enteric-coated aspirin 81 mg a day.   She has no cardiovascular complaints.   Her total cholesterol has increased from 152 to 171.  However,  triglycerides were normal at 117.  Her HDL has increased significantly  to 79.4 with a total cholesterol: HDL ratio of 2.2 and her LDL 68.  Her  LFTs were normal.   PHYSICAL EXAMINATION:  Blood pressure is 132/80, her pulse 88 and  regular.  Weight is 123.  HEENT:  Unchanged.  Carotids are equal bilaterally without bruits, no  JVD.  Thyroid is not enlarged.  Trachea is midline.  LUNGS:  Clear.  HEART:  Reveals a regular rate and rhythm.  ABDOMEN: Soft, good bowel sounds.  No midline bruit. No hepatomegaly.  EXTREMITIES: No cyanosis, clubbing or edema.  Pulses intact.  NEURO:  Exam is intact.   I had a nice talk with [name] today.  I am delighted her HDL has  increase so much with the switch to simvastatin and that her total  cholesterol:HDL ratio was so favorable.  I have made no changes.  I will  plan on seeing her back in a year.     Thomas C. Daleen Squibb, MD, [facility]  Electronically Signed    TCW/MedQ  DD: 06/07/2007  DT: 06/07/2007  Job #: [id number]   cc:   Gretta Cool, M.D.

## 2010-08-19 NOTE — Assessment & Plan Note (Signed)
[facility]                            CARDIOLOGY OFFICE NOTE   [name], [name]                          MRN:          [id number]  DATE:03/16/2007                            DOB:          Jul 02, 1940    Ms. Howard comes in today for further management of her mixed  hyperlipidemia.   She was recently hospitalized with a spontaneous pneumothorax.  This  presented with cough but not severe pain.  She has never had one before.   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  She is followed by Dr. Fannie Knee as an  outpatient.   She would like to consider a generic statin.  She has tolerated Lipitor  well with excellent numbers.  I have recommended simvastatin 20 mg a day  as an equivalent.  She will be due blood work in February, which would  work out well if we change statins.   She is having no symptoms of angina or ischemia.  She is very health  conscious.   PHYSICAL EXAMINATION:  VITAL SIGNS:  Her blood pressure today is 138/80,  pulse 69 and regular, weight is 128.  HEENT:  Normocephalic and atraumatic.  Pupils equal, round and reactive  to light and accommodation.  Extraocular movements are intact.  Sclerae  are clear.  Facial symmetry is normal.  Carotid upstrokes are equal  bilaterally without bruits.  No JVD.  Thyroid is not enlarged.  Trachea  is midline.  LUNGS:  Clear.  HEART:  Regular rate and rhythm without gallop.  ABDOMEN:  Soft with good bowel sounds.  No hepatosplenomegaly.  EXTREMITIES:  No edema.  Pulses are intact.  NEUROLOGIC:  Intact.   ELECTROCARDIOGRAM:  Normal.  There is poor R wave progression across the  precordium which is stable.   ASSESSMENT AND PLAN:  I have had a long talk with [name].  She even  wanted to consider red yeast rice, but I told her there was no value to  this.  Will switch her to simvastatin 20 mg a day and stop the Lipitor.  Will follow blood work in 6-8 weeks.   Hopefully she will achieve  comparable numbers.  Will plan on seeing her back in a year.     Thomas C. Daleen Squibb, MD, [facility]  Electronically Signed   TCW/MedQ  DD: 03/16/2007  DT: 03/17/2007  Job #: [id number]   cc:   Gretta Cool, M.D.

## 2010-08-19 NOTE — Op Note (Signed)
NAME:  [name], [name] NO.:  [id number]   MEDICAL RECORD NO.:  [id number]          PATIENT TYPE:  INP   LOCATION:  5124                         FACILITY:  [facility]   PHYSICIAN:  Salvatore Decent. Cornelius Moras, M.D. DATE OF BIRTH:  1940/04/22   DATE OF PROCEDURE:  04/04/2007  DATE OF DISCHARGE:                               OPERATIVE REPORT   PREOPERATIVE DIAGNOSIS:  Recurrent left spontaneous pneumothorax.   POSTOPERATIVE DIAGNOSIS:  Recurrent left spontaneous pneumothorax.   PROCEDURE:  Left video-assisted thoracoscopy for bleb resection x4 with  lung biopsy and mechanical pleurodesis and pleurectomy.   SURGEON:  Dr. Purcell Nails.   ASSISTANT:  Stephanie Acre. Dominick, PA.   ANESTHESIA:  General.   BRIEF CLINICAL NOTE:  The patient is a 70 year old female with history  of left spontaneous pneumothorax in November of 2008 treated with small  chest tube placement at that time.  She now returns with a second  recurrent pneumothorax on the left side.  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  The  patient has been counseled regarding the indications, risks, and  potential benefits of surgery both for treatment of recurrent  spontaneous pneumothorax and possible lung biopsy to evaluate the  underlying cause for chronic lung disease.  Alternative treatment  strategies have been reviewed.  Unfortunately, the patient's previous CT  scan is not available for immediate review.  Alternatives with respect  to this have been discussed at length including proceeding with surgery  without review of the CT scan versus placement of the second chest tube  to allow the lung to re-expand and perform repeat chest CT scan prior to  surgery.  The patient elects to forego the repeat CT scan, understands  there is probably a small associated risk of missing some of the  cystic  lesions in the lung not obviously visible on gross inspection at the  time of surgery.  All of her questions have been addressed.   OPERATIVE NOTE IN DETAIL:  The patient is brought to the operating room  on the above-mentioned date and placed in the supine position on the  operating table.  A radial arterial line and central venous catheters  were placed by the anesthesia service under the care and direction of  Dr. Diamantina Monks.  General endotracheal anesthesia is induced  uneventfully.  A Foley catheter is placed.  The patient is turned to the  right lateral decubitus position using a pneumatic beanbag device and  axillary roll to facilitate positioning.  Pneumatic sequential  compression boots were placed on both lower extremities to prevent deep  venous thrombosis.  The patient's left chest was prepared and draped in  sterile manner.  Single lung ventilation is begun.   A small incision is made overlying the anterior axillary line through  approximately the sixth intercostal space.  The incision is completed  through the subcutaneous tissues and intercostal musculature with  electrocautery.  The left chest was entered bluntly.  A  10 mm port is  passed through the incision and a video thoracoscopic camera is passed  through the port.  The left chest was explored visually.  The lung was  collapsed with exception of the apex where some wispy adhesions exist  between the visceral and parietal pleura.  There are obvious large  subpleural blebs along the left lower lobe and the left upper lobe  anteriorly.  The adhesions towards the apex of the lung were divided  with electrocautery to facilitate mobilization of the entire left lung.  Careful inspection was then made to examine all of the large subpleural  blebs.  There is one particularly large bleb formation located  anteriorly in the left lower lobe.  This was amputated with Echelon EZ-  45 stapling device and labeled left lower  lobe wedge resection #1.  A  second series of blebs was located along the base of the left lower lobe  overlying the dome of the diaphragm and these series of blebs are  amputated with another wedge resection.  Portion of the lung laterally  in the left upper lobe was then resected with the left upper lobe wedge  #1.  Finally, the apex of the left upper lobe is amputated and labeled  left upper lobe wedge #2.  Although one can appreciate some other  parenchymal blebs within the lung, this appears to have taken care of  all of the large subpleural bleb formations there are grossly visible.  There was no clear sign of a ruptured bleb to represent the culprit for  the patient's presenting pneumothorax.  The apical anterior and lateral  parietal pleura is now removed using blunt traction to pull the pleura  off the chest wall and a portion of the pleura was sent for pleural  biopsy.  The remainder of the parietal pleura is scratched using a  cautery scratchpad including the parietal surface of the left  hemidiaphragm.  The On-Q continuous pain management system is utilized  to facilitate postoperative pain control.  One 5-inch catheter supplied  with the On-Q kit is tunneled through the sixth intercostal space and  positioned subpleural posteriorly near the gutter in a cephalad  direction oriented over nerve root outlets of the third through the  fifth intercostal spaces.  This catheter is flushed with 5 mL of 0.5%  bupivacaine solution and ultimately connected to a continuous infusion  pump.  The left chest was drained using a single 28-French chest tube  exited through the middle port incision.  The two remaining port  incisions were then closed in multiple layers with running absorbable  suture.  The chest tube is fixed to closed suction drainage device.  After observation for a period of minutes there appears to be some air  leak notable.  As such, one of the two remaining port incisions  were  reopened and 10 mm port passed through the incisions.  The left chest  was then filled with warm saline solution and briefly ventilated under  thoracoscopic visualization.  No sign of significant bubbles to suggest  a large air leak is identified.  All the staple lines were inspected and  appear to be intact.  The two remaining port incisions were then re-  closed and the chest tube re-hooked up to closed continuous suction.  The patient tolerated the procedure well, was extubated in the operating  room, transported to the recovery room in stable condition.  There were  no intraoperative complications.  All sponge,  instrument and needle  counts verified correct at completion of the operation.      Salvatore Decent. Cornelius Moras, M.D.  Electronically Signed     CHO/MEDQ  D:  04/04/2007  T:  04/04/2007  Job:  [id number]   cc:   Joni Fears D. Maple Hudson, MD, FCCP, FACP  Thomas C. Daleen Squibb, MD, [facility]  Gretta Cool, M.D.

## 2010-08-19 NOTE — Assessment & Plan Note (Signed)
[facility]                             PULMONARY OFFICE NOTE   Taylor, [name]                          MRN:          [id number]  DATE:02/25/2007                            DOB:          07-21-1940    PROBLEM:  1. Spontaneous left pneumothorax.  2. Right upper lobe nodule.  3. Hyperlipidemia.  4. Esophageal reflux.   HISTORY:  This never smoker without prior history of significant lung  disease had developed a cough, and then sustained a large left  spontaneous pneumothorax requiring hospitalization and chest tube  decompression November 14th through November 17th at [facility].  She is seen  now to establish office followup.  She has had flu vaccine and  pneumococcal vaccine this year.  She feels much improved, and ready to  resume normal activities.  There is no family history of pneumothorax.   MEDICATIONS:  1. Femring.  2. Lipitor 10 mg.  3. Aspirin 81 mg.  4. Vitamin D 50,000 units twice weekly.  5. Citracal with vitamin D.  6. Magnesium.  7. Omega 3.  8. Lotemax.   NO MEDICATION ALLERGY.   OBJECTIVE:  Weight 127 pounds.  BP 140/82.  Pulse 78.  Room air  saturation 97%.  There are equal clear breath sounds.  No neck vein distention or stridor.  HEART:  Sounds are normal without murmur or gallop.  I do not find adenopathy, cyanosis, clubbing, or edema.  The left thoracostomy drainage site was clean and closed.  Bulk dressing  was replaced with a Band-Aid.  CHEST X-RAY:  In the office today does not demonstrate pneumothorax to  the accuracy of our computer screen.  A small nodular density is noted  behind the right clavicle.   IMPRESSION:  1. Status post left spontaneous pneumothorax drained by tube      thoracostomy, now resolved, but with uncertainty about possible      underlying parenchymal lung defects putting her at increased risk.  2. Lung nodule, probably benign.  Inappropriate to visualize.   PLAN:  1. She will remove her  Band-Aid from the wound.  Clean gently with      soap and water.  Dress with Polysporin ointment, and replace the      Band-Aid for 1 week.  Gradually increase activity.  2. CT scan of the chest, no contrast, to evaluate for blebs and other      parenchymal defects, as well as to evaluate the right upper lobe      nodule.  3. Schedule return in 2 months, earlier p.r.n.     Clinton D. Maple Hudson, MD, [facility], FACP  Electronically Signed    CDY/MedQ  DD: 02/25/2007  DT: 02/26/2007  Job #: [id number]   cc:   Gretta Cool, M.D.

## 2010-08-19 NOTE — Assessment & Plan Note (Signed)
[facility]                            CARDIOLOGY OFFICE NOTE   TENNESSEE, HANLON                          MRN:          [id number]  DATE:06/06/2008                            DOB:          October 22, 1940    [name] comes in today for management of her mixed hyperlipidemia.   She has been doing remarkably well.  It sounds like she had a  pleurodesis for her history of spontaneous pneumothoraces.   MEDICATIONS:  1. She is currently on simvastatin 20 mg nightly.  2. She is taking omega-3.  3. Fish oil 1000 mg p.o. b.i.d.  4. She is on aspirin 81 mg a day.   Her lipid panel shows a total cholesterol 175, triglycerides are normal  115, HDL is up to 63.4 from 57 average with 1 aberrant value being 79,  LDLs 89.  Total cholesterol HDL ratio is 2.8.  LFTs were normal.   She denies any symptoms of angina or ischemia.  She has not been out  very much because of the severe weather.  She is anxious to get out in  the spring.   PHYSICAL EXAMINATION:  VITAL SIGNS:  Blood pressure today is 126/78, her  pulse 73 and regular, weight is 128 up 5.  HEENT:  Normal.  Carotid upstrokes were equal bilaterally without  bruits.  No JVD.  Thyroid is not enlarged.  Trachea is midline.  LUNGS:  Clear to auscultation and percussion.  HEART:  Nondisplaced PMI.  Normal S1 and S2.  No murmur, rub, or gallop.  ABDOMEN:  Soft.  Good bowel sounds.  No midline bruit.  No hepatomegaly.  EXTREMITIES:  No cyanosis, clubbing, or edema.  Pulses are intact.   EKG is essentially normal except for mild left axis deviation.   I had a long talk with [name] today.  I have encouraged her to get  back out and do cardioactivity 3 hours a week.  Hopefully, this will  drop her weight some.  I am very pleased with her lipid results and I  have told her to stay on omega-3 which seems to help her HDL and perhaps  even her triglycerides.  We will plan on seeing her back again in a  year.     Thomas C. Daleen Squibb, MD, [facility]  Electronically Signed    TCW/MedQ  DD: 06/06/2008  DT: 06/07/2008  Job #: [id number]   cc:   Gretta Cool, M.D.

## 2010-08-19 NOTE — Assessment & Plan Note (Signed)
OFFICE VISIT   Taylor, Howard  DOB:  September 21, 1940                                        April 25, 2007  CHART #:  [id number]   HISTORY OF PRESENT ILLNESS:  [name] returns for routine follow-up  status post left vat for blood resection with apical pleurectomy and  mechanical pleurodesis on 04/04/07 for recurrent left spontaneous  pneumothorax.  [name] postoperative recovery has been entirely  uneventful.  Her final pathology reports which were sent out for outside  review were notable for the absence of findings suggestive of LAM.  She  returns to the office for routine follow-up today.  Overall [name] has  done quite well.  She has only very mild residual soreness on her left  chest and she has not required use of any significant pain medications.  She has not had any fevers or chills.  She has not had shortness of  breath.  Her activity level is quite good.  She has no other specific  complaints.  Her medications remain unchanged from the time of hospital  discharge.  She has been given a prescription for prednisone by Dr.  Maple Hudson, but she has refrained from starting that medication so far to  allow herself to recover from her recent surgery first.   PHYSICAL EXAMINATION:  Notable for a well appearing female, blood  pressure 152/79, pulse 82, oxygen saturation 99% on room air.  Examination of the chest is notable for small incisions from recent  video orthoscopic surgery that are all healing very well.  Breath sounds  are clear to auscultation and symmetrical bilaterally. No wheezes or  rhonchi are noted.  Abdomen:  Soft, nontender.  Extremities:  Warm and  well perfused.  The remainder of her physical exam is unrevealing.   DIAGNOSTIC TEST:  Chest x-ray obtained today is reviewed.  This  demonstrates clear lung fields bilaterally with improved aeration in the  left lung base.  There is no pneumothorax.  No other significant  abnormalities are  noted.   IMPRESSION:  Excellent progress following recent left vat for recurrent  left spontaneous pneumothorax in the setting of diffuse large multiple  cystic lesions throughout both lungs despite the absence of any  documented history of tobacco use or exposure to known respiratory  pathogens.  [name] seems to be doing quite well.  Her pathology  results were not consistent with LAM.   PLAN:  [name] will continue to follow-up with Dr. Maple Hudson.  I think she  can gradually resume normal physical activity without any specific  limitations. All of her questions have been addressed.  In the future  she will call and return to see [place] here at [facility] only if she has further problems or difficulties arise.   Salvatore Decent. Cornelius Moras, M.D.  Electronically Signed   CHO/MEDQ  D:  04/25/2007  T:  04/25/2007  Job:  [id number]   cc:   Joni Fears D. Maple Hudson, MD, FCCP, FACP

## 2010-08-19 NOTE — Consult Note (Signed)
NAME:  [name], [name] NO.:  [id number]   MEDICAL RECORD NO.:  [id number]          PATIENT TYPE:  INP   LOCATION:  5124                         FACILITY:  [facility]   PHYSICIAN:  Salvatore Decent. Dorris Fetch, M.D.DATE OF BIRTH:  29-Sep-1940   DATE OF CONSULTATION:  04/03/2007  DATE OF DISCHARGE:                                 CONSULTATION   .   REASON FOR CONSULTATION:  Recurrent left pneumothorax.   HISTORY OF PRESENT ILLNESS:  [name] is a 70 year old woman who was  admitted in November with a spontaneous pneumothorax that was treated  with a pigtail catheter placement.  Her air leak resolved, lung re-  expanded.  She was discharged home.  She was not seen by thoracic  surgeon during that admission.  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  No biopsy was  performed.  She subsequently did well until yesterday when she noticed  onset of some faint chest pain on the left side anteriorly and shortness  of breath.  She was carrying items up the stairs and would get extremely  short of breath at the top of the stairs which was unusual for her.  She  came the emergency room and was found to have a large inferior  pneumothorax.  There were adhesions superiorly.  The patient was  clinically stable with good saturations on room air and her pain had  resolved with no acute shortness of breath, and she was admitted and has  been followed with serial chest x-rays.  Unfortunately, the radiology  server is down, I am not able to review the CT scan, but was able to see  the chest x-ray which did show a large basilar pneumothorax.   PAST MEDICAL HISTORY:  1. Previous left pneumothorax.  2. Hyperlipidemia.   PAST SURGICAL HISTORY:  Hysterectomy.   CURRENT MEDICATIONS:  1. Lipitor 10 mg daily.  2. Estrogen patch.  3. Multiple vitamins.  4. Aspirin 81 mg daily.   ALLERGIES:  She has no known drug allergies.   FAMILY HISTORY:  Noncontributory.   SOCIAL HISTORY:  She is a nonsmoker.  No history of chemical exposure.  She is divorced.   PHYSICAL EXAMINATION:  GENERAL:  [name] is a well-appearing 66-year-  old white female in no acute distress.  She is well-developed and well-  nourished.  VITAL SIGNS:  She is afebrile.  Blood pressure 144/79, pulse is 70,  respirations 18, saturations 95% on room air.  HEENT:  Exam is unremarkable.  NEUROLOGICAL:  She is alert, oriented x3 and grossly intact.  CARDIAC:  Exam has regular rate and rhythm.  Normal S1-S2.  She has  diminished breath sounds at the left base.  No wheezing.  No  subcutaneous emphysema.  ABDOMEN:  Soft, nontender.  EXTREMITIES:  Without clubbing, cyanosis or edema.   LABORATORY:  EKG showed normal sinus rhythm.  Chest x-ray as previously  described.  Her CPK-MB was 1.8.  Troponin was less than 0.05.  Her  sodium was 139, potassium 3.90, BUN 11, creatinine 1.0.  White count  6.2, hematocrit 46, platelets 322.  she had an alpha antitrypsin from  her previous admission which was 117 within normal limits.   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  She was treated with a chest tube previously  but is expected to have recurred over a short period time.  She needs a  left VATS, lung biopsy and pleurodesis in mechanical or with talc.  I  have discussed with her the indications, benefits and alternatives.  She  understands that her for a chance for recurrent pneumothorax without  treatment approaches 100%.  She understands the risks include but are  not limited to death, MI, DVT, PE, bleeding, possible need for  transfusion, infection as well as other organ system dysfunction.  She  understand the need for general anesthesia as well as the scope approach  and the reason for  doing lung biopsy to confirm diagnosis prior to the  pleurodesis.  She understands and agrees to proceed with surgery.   There is operating time available tomorrow.  I will not be here but will  ask either Dr. Cornelius Moras or Dr. Tyrone Sage, my partners to do the procedure.      Salvatore Decent Dorris Fetch, M.D.  Electronically Signed     SCH/MEDQ  D:  04/03/2007  T:  04/04/2007  Job:  [id number]   cc:   Joni Fears D. Maple Hudson, MD, FCCP, FACP  Thomas C. Daleen Squibb, MD, [facility]  Gretta Cool, M.D.

## 2010-08-19 NOTE — Discharge Summary (Signed)
NAME:  [name], [name] NO.:  [id number]   MEDICAL RECORD NO.:  [id number]          PATIENT TYPE:  INP   LOCATION:  3304                         FACILITY:  [facility]   PHYSICIAN:  [name], M.D. DATE OF BIRTH:  31-Dec-1940   DATE OF ADMISSION:  04/02/2007  DATE OF DISCHARGE:  04/08/2007                               DISCHARGE SUMMARY   PRIMARY ADMITTING DIAGNOSIS:  Recurrent left spontaneous pneumothorax.   ADDITIONAL/DISCHARGE DIAGNOSES:  1. Recurrent left spontaneous pneumothorax.  2. Hyperlipidemia.   PROCEDURES PERFORMED:  Left VATS with multiple bleb resections, lung  biopsy, mechanical pleurodesis and pleurectomy.   HISTORY:  The patient is a 69 year old female who initially presented in  November 2008 with a spontaneous pneumothorax which was treated with a  pigtail catheter placement.  She did undergo a CT scan at that time  which showed multiple cystic lesions throughout the lung which was  consistent with possible lymphangioleiomyomatosis  (LAM).  She was  subsequently discharged home and had done well until the date of this  admission when she noted some chest pain anteriorly on the left side  with shortness of breath.  She presented to the emergency department and  was found to have a large inferior pneumothorax.  She was seen by Dr.  Jetty Howard and was subsequently admitted for further evaluation and  treatment.   HOSPITAL COURSE:  [name] was seen in consultation by thoracic surgery  and her CT scan was reviewed.  It was felt that because of the  recurrence of her pneumothorax and her chest CT notable for cystic  lesions which were indicative of LAM she would probably benefit from a  VATS with lung biopsy and pleurodesis at this time.  She remained stable  and in no acute distress and all risks, benefits and alternatives of  surgery were discussed with the patient.  She agreed to proceed and was  subsequently taken to the operating room on  April 04, 2007 for a left  VATS with lung biopsies, lobe resection, pleurectomy and mechanical  pleurodesis as described in detail above.  Multiple blebs were resected  at the time of surgery.  She tolerated the procedure well and was  transferred to unit 3300 in stable condition.   Postoperatively she has done well.  Her follow-up chest x-rays showed no  pneumothorax.  She also was not noted to have an air leak on exam of her  chest tube.  Her tube was slowly decreased to water seal on postop day  #2 and was discontinued on postop day #3.  Her follow-up chest x-rays  have remained stable.  At the time of discharge her pathology results  remain pending.  She has otherwise done well.  She has remained afebrile  and all vital signs have been stable.  Her O2 sats have been greater  than 90% on room air.  All incisions are healing well.  She is  tolerating a regular diet and is having normal bowel and bladder  function.  She is ambulating in the halls without difficulty.  Her labs on postop day #3 show a hemoglobin 13.3, hematocrit 38.9, white  count 11.4, platelets 316, sodium 139, potassium 3.7, BUN 6, creatinine  0.71.  She was evaluated by Dr. Cornelius Howard on April 08, 2007 and her chest x-  ray was reviewed and he felt that she was stable for discharge home.   DISCHARGE MEDICATIONS:  1. Lipitor 10 mg q.h.s.  2. Ultram 50-100 mg q.4-6h. as needed for pain.   DISCHARGE INSTRUCTIONS:  She is asked to refrain from driving, heavy  lifting or strenuous activity.  She may continue ambulating daily and  using her incentive spirometer.  She may shower daily and clean her  incisions with soap and water.  She will continue her same preoperative  diet.   DISCHARGE FOLLOWUP:  She will see Dr. Cornelius Howard back in the office and our  office will contact her with an appointment time as well as a time for a  chest x-ray from [facility] imaging.  She will keep her previously  scheduled appointment with Dr.  Maple Howard.  Hopefully at the time of her  follow-up in the office her pathology results will be available and a  treatment plan can be established if needed.  In the interim she is  asked to contact our office if there are any problems or questions.      Coral Ceo, P.A.      [name], M.D.  Electronically Signed    GC/MEDQ  D:  04/08/2007  T:  04/08/2007  Job:  [id number]   cc:   Taylor Fears D. Maple Hudson, MD, FCCP, FACP

## 2010-08-22 NOTE — H&P (Signed)
NAME:  [name], [name]   MEDICAL RECORD NO.:  [id number]          PATIENT TYPE:  INP   LOCATION:  3304                         FACILITY:  [facility]   PHYSICIAN:  Oley Balm. Sung Amabile, MD   DATE OF BIRTH:  Feb 06, 1941   DATE OF ADMISSION:  04/02/2007  DATE OF DISCHARGE:  04/08/2007                              HISTORY & PHYSICAL   NOTATION:  This admission history and physical is dictated belatedly due  to the fact that the initial admission history and physical was either  never dictated or was lost.  I am presently dictating this from the  notes in the chart.   ADMISSION DIAGNOSIS:  Spontaneous pneumothorax.   HISTORY OF PRESENT ILLNESS:  [name] is a 70 year old woman with a  history of questionable LAM.  She is followed by Dr. Jetty Duhamel.  She  has a history of prior spontaneous left pneumothorax.  She  presented on  the day of admission with shortness of breath and left-sided chest pain.  She denied fever, cough, lower extremity edema, hemoptysis and calf  tenderness.  Upon arrival to the emergency department with these  symptoms, chest x-ray showed a moderate left-sided pneumothorax.  Consequently, I am asked to admit her.   PAST MEDICAL HISTORY:  1. Hyperlipidemia.  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  She is followed by Dr. Maple Hudson      for this.  She has a few cystic lesions in both lungs by CT scan      and a prior left pneumothorax.   CURRENT MEDICATIONS:  1. Lipitor.  2. Estrogen patch.  3. Aspirin 81 mg per day.  4. Multivitamin.   SOCIAL HISTORY:  Never smoked.  No history of significant occupational  exposures.   FAMILY HISTORY:  Noncontributory.   PHYSICAL EXAMINATION:  GENERAL:  On admission, she was in no acute  cardiac or respiratory distress.  HEAD/NECK:  Normal.  VITAL SIGNS:  Normal.  She was afebrile.  CHEST:  Revealed diminished breath sounds on the left with no wheezes.  CARDIAC:  Reveals a regular rate and  rhythm.  No murmurs.  ABDOMEN:  Soft, nontender with normal bowel sounds.  EXTREMITIES:  Without clubbing, cyanosis and edema.   DIAGNOSTICS:  Chest x-ray revealed a moderate size left pneumothorax.  Laboratory data was otherwise unremarkable.   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   PLAN:  I contacted Dr. Charlett Lango of cardiovascular and thoracic  surgery for probable VATS and pleurodesis given that is her second  occurrence of pneumothorax.      Oley Balm Sung Amabile, MD  Electronically Signed     DBS/MEDQ  D:  06/30/2007  T:  07/01/2007  Job:  [id number]

## 2010-08-22 NOTE — Assessment & Plan Note (Signed)
[facility]                            CARDIOLOGY OFFICE NOTE   Taylor, Howard                          MRN:          [id number]  DATE:06/10/2006                            DOB:          10/26/1940    [name] returns today for further management of her mixed  hyperlipidemia.   She has managed to lose 5 pounds though she admits to not exercising.  She feels good but has multiple questions today about statins and side  effects.  She brings numerous articles from the Internet and other  material.   She is really having no significant statin-related side effects.   She denies any angina or ischemic symptoms.  I have encouraged her to  get back into an exercise program.   Her medications are:  1. Femring.  2. Lipitor 10 mg daily.  3. Multivitamin daily.  4. Aspirin 81 mg a day.  5. Vitamin D3 50,000 units two times a week.  6. Citracal 1260 mg with vitamin D.  7. Omega 3 1000 mg b.i.d.  8. Lotemax 0.5% x1 week.   Her blood pressure today is 138/80, her pulse is 69 and regular, weight  is 128.  She is in no acute distress.  HEENT normocephalic, atraumatic.  PERRLA.  Extraocular movements intact.  Sclerae clear.  Facial symmetry  is normal.  Carotid upstrokes are equal bilaterally without bruits.  There is no JVD.  Lungs are clear to auscultation and percussion.  Heart  reveals a regular rate and rhythm with a soft systolic murmur.  She has  an S4.  S2 splits normally.  There is no diastolic component.  Abdominal  exam is soft with good bowel sounds and no midline bruit.  There is no  hepatomegaly.  Extremities reveal no cyanosis, clubbing or edema.  Pulses are intact.  Neurologic exam is intact.  There is no muscle  tenderness specifically.   Her electrocardiogram shows normal sinus rhythm with a left axis  deviation.  There has been no significant change.   Lipid panel is remarkably good on 10 of Lipitor on February 26.  Total  cholesterol 152, triglycerides are now normalized at 121, HDL is 57.5,  LDL is 70.  Her LFTs are normal.   ASSESSMENT AND PLAN:  I spent 20 minutes answering all the questions  that [name] brings today.  I have reassured her that she is tolerating  Lipitor just fine and she has had a very good response.  I have made no  changes in her program.  I have recommended, however, that  she walk or do some sort of cardio activity 3 hours per week.  I will  plan on seeing her back in a year.     Thomas C. Daleen Squibb, MD, [facility]  Electronically Signed    TCW/MedQ  DD: 06/10/2006  DT: 06/10/2006  Job #: [id number]   cc:   Gretta Cool, M.D.

## 2010-08-22 NOTE — Op Note (Signed)
NAME:  [name], [name]                             ACCOUNT NO.:  [id number]   MEDICAL RECORD NO.:  [id number]                   PATIENT TYPE:  AMB   LOCATION:  DSC                                  FACILITY:  [facility]   PHYSICIAN:  Feliberto Gottron. Turner Daniels, M.D.                DATE OF BIRTH:  June 16, 1940   DATE OF PROCEDURE:  01/08/2003  DATE OF DISCHARGE:                                 OPERATIVE REPORT   PREOPERATIVE DIAGNOSIS:  Left foot bunion deformity.   POSTOPERATIVE DIAGNOSIS:  Left foot bunion deformity.   PROCEDURE:  Left foot proximal first metatarsal Juvara osteotomy, followed  by distal bunionectomy.   SURGEON:  Feliberto Gottron. Turner Daniels, M.D.   FIRST ASSISTANT:  Yerania B. Jannet Mantis.   ANESTHESIA:  General LMA.   ESTIMATED BLOOD LOSS:  Minimal.   FLUIDS REPLACED:  800 mL crystalloid.   DRAINS PLACED:  None.   TOURNIQUET TIME:  35 minutes.   INDICATION FOR PROCEDURE:  A 70 year old woman followed for left foot bunion  deformity with a 1-2 metatarsal angle of about 15 degrees and a bunion angle  of about 35 degrees.  She is taken for a left foot proximal first metatarsal  osteotomy and distal bunionectomy to realign her first ray on the left foot.   INDICATION FOR PROCEDURE:  A 70 year old woman with painful bunion deformity  as described above, who has failed conservative treatment with shoe wear and  modified activities.   DESCRIPTION OF PROCEDURE:  Patient identified by arm band, taken to the  operating room at [facility] day surgery center, appropriate anesthetic monitors  were attached, and general LMA induced with the patient in the supine  position.  Ankle tourniquet applied to the left foot and the left foot  prepped and draped in the usual sterile fashion from the toes to the  tourniquet.  We began the procedure by wrapping the foot in an Esmarch  bandage, inflating the tourniquet to 300 mmHg, and making a dorsomedial  incision, starting out at the first TMT joint and going  distally to about 2  cm past the MTP joint.  Small bleeders in the skin and subcutaneous tissue  identified and cauterized.  We were careful to protect any significant  nerves or veins on the dorsomedial aspect of the foot.  We dissected over  the bunion deformity itself and then made a hockey stick incision in the  capsule and then peeled the L-shaped part of the incision inferiorly,  exposing the bunion ossicle itself.  This was then removed with a power saw,  staying to the medial side of the crista.  The patient did have some  arthritic changes to the distal aspect of the great toe metatarsal, grade 3-  4 chondromalacia.  The proximal phalanx had minimal chondromalacia.  Satisfied with the removal of the bump, we then worked proximally and  performed a  Juvara closing wedge metatarsal osteotomy with the ACL saw,  leaving a flap of bone intact medially and after removing the wedge, fixing  the osteotomy with a distal unicortical cortical lag screw and a more  proximal cancellous lag screws.  X-ray documentation of the osteotomy and  the fixation was made, showing good reduction of the deformity.  Part of the  L-shaped hockey stick was then trimmed back, allowing a medial reefing of  the capsule, which was then repaired with 3-0 Vicryl suture, completing the  bunion deformity correction.  The lateral capsule was not tight at all and  did not need to be fenestrated.  The tourniquet was let down and small  bleeders identified and cauterized.  The skin only was then closed with  running interlocking 5-0 nylon suture.  A dressing of Xeroform, 4 x 4  dressing sponges in a toe spica pattern with tweeners, Webril, an Ace wrap,  and a postoperative shoe were then applied.  The patient was then awakened  and taken to the recovery room without difficulty.                                               Feliberto Gottron. Turner Daniels, M.D.    Ovid Curd  D:  01/08/2003  T:  01/09/2003  Job:  [id number]

## 2010-10-17 ENCOUNTER — Ambulatory Visit (INDEPENDENT_AMBULATORY_CARE_PROVIDER_SITE_OTHER): Payer: Medicare Other | Admitting: Family Medicine

## 2010-10-17 ENCOUNTER — Encounter: Payer: Self-pay | Admitting: Family Medicine

## 2010-10-17 VITALS — BP 130/80 | Temp 97.8°F | Wt 109.0 lb

## 2010-10-17 DIAGNOSIS — E119 Type 2 diabetes mellitus without complications: Secondary | ICD-10-CM

## 2010-10-17 DIAGNOSIS — I10 Essential (primary) hypertension: Secondary | ICD-10-CM

## 2010-10-17 NOTE — Progress Notes (Signed)
  Subjective:    Patient ID: [name], female    DOB: 1940-05-23, 70 y.o.   MRN: [id number]  HPI Followup type 2 diabetes. Prior to losing about 20 pounds and making major dietary changes she had significant elevations of blood sugar. She is controlled with lifestyle. Last A1c 5.7%. No significant elevation by home readings. Hypertension treated with lisinopril 10 mg daily. No orthostasis. Blood pressure log reviewed in excellent control. Walks for exercise.   Review of Systems  Constitutional: Negative for fever, activity change, appetite change and unexpected weight change.  Respiratory: Negative for shortness of breath.   Cardiovascular: Negative for chest pain and palpitations.  Genitourinary: Negative for dysuria.  Neurological: Negative for dizziness.       Objective:   Physical Exam  Constitutional: She is oriented to person, place, and time. She appears well-developed and well-nourished. No distress.  Cardiovascular: Normal rate and regular rhythm.   Pulmonary/Chest: Effort normal and breath sounds normal. No respiratory distress. She has no wheezes. She has no rales.  Musculoskeletal: She exhibits no edema.  Neurological: She is alert and oriented to person, place, and time.          Assessment & Plan:  #1 type 2 diabetes. Reassess A1c. If well-controlled this time go to every 6 months #2 hypertension stable by home readings. Continue current medication

## 2010-10-21 ENCOUNTER — Telehealth: Payer: Self-pay | Admitting: Family Medicine

## 2010-10-21 NOTE — Progress Notes (Signed)
Quick Note:  Pt informed ______ 

## 2010-10-21 NOTE — Telephone Encounter (Signed)
Pt informed

## 2010-10-21 NOTE — Telephone Encounter (Signed)
Patient received a A1C test on 10/17/10 and was calling to inquire about the results. Is worried because she was supposed to hear the results by Monday. Please contact.

## 2010-12-25 ENCOUNTER — Ambulatory Visit (INDEPENDENT_AMBULATORY_CARE_PROVIDER_SITE_OTHER): Payer: Medicare Other | Admitting: Cardiology

## 2010-12-25 ENCOUNTER — Encounter: Payer: Self-pay | Admitting: Cardiology

## 2010-12-25 VITALS — BP 120/74 | HR 60 | Ht 60.0 in | Wt 106.4 lb

## 2010-12-25 DIAGNOSIS — E785 Hyperlipidemia, unspecified: Secondary | ICD-10-CM

## 2010-12-25 DIAGNOSIS — I10 Essential (primary) hypertension: Secondary | ICD-10-CM

## 2010-12-25 DIAGNOSIS — E119 Type 2 diabetes mellitus without complications: Secondary | ICD-10-CM

## 2010-12-25 LAB — CBC
Hemoglobin: 13.3
Platelets: 316
RDW: 14

## 2010-12-25 LAB — BASIC METABOLIC PANEL
BUN: 6
Calcium: 8.7
GFR calc non Af Amer: >60
Glucose, Bld: 124 — ABNORMAL HIGH
Sodium: 139

## 2010-12-25 NOTE — Patient Instructions (Signed)
Your physician recommends that you schedule a follow-up appointment in: as needed with Dr. Dorinda Hill may stop taking your multivitamin and your fish oil

## 2010-12-25 NOTE — Assessment & Plan Note (Signed)
Excellent control. Followup with primary care.

## 2010-12-25 NOTE — Progress Notes (Signed)
HPI [name] and he returns today for evaluation and management next hyperlipidemia, new onset diabetes without complications, and essential hypertension.  She is taking her health issues very seriously. She has lost 20 some pounds and is down to 106. Her blood pressures are averaging 115/65 with daily checks. Heart rates in the 60s. Her last hemoglobin A1c was 5.6%. Her lipids are at goal with a very low total cholesterol HDL ratio of 2. She is now followed by primary care with Dr.Burchette.  Denies any shortness of breath, chest pain, or palpitations. Blood work is now being done by primary care.  EKG not done today. No clinical indication. Past Medical History  Diagnosis Date  . Diabetes mellitus   . GERD (gastroesophageal reflux disease)   . Hypertension   . Hyperlipidemia   . Migraines   . Kidney stones   . Cancer 2001    skin, nose  . Pneumothorax on left   . DNR (do not resuscitate) 2009  . Diverticulosis of colon   . Other diseases of lung, not elsewhere classified   . Acute bronchitis   . Abnormal chest x-ray   . Osteoporosis 2012    Dr Nicholas Lose    Past Surgical History  Procedure Date  . Lung surgery 2008    left lung VATS for biopsy and removal of blebs with pleurodesis  . Bunionectomy 2004  . Carpal tunnel release 2000  . Abdominal hysterectomy     fibroids  . Tonsillectomy     Family History  Problem Relation Age of Onset  . Cancer Mother     bone  . Diabetes Sister   . Cancer Sister     ovarian  . Hyperlipidemia Sister   . Emphysema Maternal Aunt   . Pulmonary embolism Father   . Cancer Sister     melanoma    History   Social History  . Marital Status: Divorced    Spouse Name: N/A    Number of Children: N/A  . Years of Education: N/A   Occupational History  . Not on file.   Social History Main Topics  . Smoking status: Never Smoker   . Smokeless tobacco: Not on file  . Alcohol Use: Not on file  . Drug Use: Not on file  . Sexually Active:  Not on file   Other Topics Concern  . Not on file   Social History Narrative  . No narrative on file    No Known Allergies  Current Outpatient Prescriptions  Medication Sig Dispense Refill  . aspirin 81 MG tablet Take 81 mg by mouth daily.        . calcium citrate-vitamin D 200-200 MG-UNIT TABS Take 2 tablets by mouth. Taking 2 Tablets Twice a Day 1260/1000 mg       . Cholecalciferol (VITAMIN D) 1000 UNITS capsule 1,000 Units 2 (two) times daily.       [name] estradiol (CLIMARA - DOSED IN MG/24 HR) 0.075 mg/24hr 1/2 patch weekly per [name] MD      . fish oil-omega-3 fatty acids 1000 MG capsule Take 2 g by mouth daily.        [name] lisinopril (PRINIVIL,ZESTRIL) 10 MG tablet Take 10 mg by mouth daily. Per Dr Daleen Squibb      . Magnesium 250 MG TABS Take 1 tablet by mouth 2 (two) times daily.        . Multiple Vitamin (MULTIVITAMIN PO) Take 1 tablet by mouth daily. Uses Natural Alternatives Brand       .  simvastatin (ZOCOR) 20 MG tablet Take 20 mg by mouth at bedtime.         ROS Negative other than HPI.   PE General Appearance: well developed, well nourished in no acute distress HEENT: symmetrical face, PERRLA, good dentition  Neck: no JVD, thyromegaly, or adenopathy, trachea midline Chest: symmetric without deformity Cardiac: PMI non-displaced, RRR, normal S1, S2, no gallop or murmur Lung: clear to ausculation and percussion Vascular: all pulses full without bruits  Abdominal: nondistended, nontender, good bowel sounds, no HSM, no bruits Extremities: no cyanosis, clubbing or edema, no sign of DVT, no varicosities  Skin: normal color, no rashes Neuro: alert and oriented x 3, non-focal Pysch: normal affect Filed Vitals:   12/25/10 1356  BP: 120/74  Pulse: 60  Height: 5' (1.524 m)  Weight: 106 lb 6.4 oz (48.263 kg)    EKG  Labs and Studies Reviewed.   Lab Results  Component Value Date   WBC 11.4* 04/07/2007   HGB 13.3 04/07/2007   HCT 38.9 04/07/2007   MCV 94.5 04/07/2007   PLT  316 04/07/2007      Chemistry      Component Value Date/Time   NA 143 07/09/2010 1258   K 4.3 07/09/2010 1258   CL 104 07/09/2010 1258   CO2 29 07/09/2010 1258   BUN 14 07/09/2010 1258   CREATININE 0.7 07/09/2010 1258      Component Value Date/Time   CALCIUM 9.9 07/09/2010 1258   ALKPHOS 42 06/11/2010 1144   AST 26 06/11/2010 1144   ALT 32 06/11/2010 1144   BILITOT 0.9 06/11/2010 1144       Lab Results  Component Value Date   CHOL 145 06/11/2010   CHOL 150 05/28/2009   CHOL 175 05/29/2008   Lab Results  Component Value Date   HDL 59.50 06/11/2010   HDL 63.70 05/28/2009   HDL 86.5 05/29/2008   Lab Results  Component Value Date   LDLCALC 73 06/11/2010   LDLCALC 56 05/28/2009   LDLCALC 89 05/29/2008   Lab Results  Component Value Date   TRIG 65.0 06/11/2010   TRIG 152.0* 05/28/2009   TRIG 115 05/29/2008   Lab Results  Component Value Date   CHOLHDL 2 06/11/2010   CHOLHDL 2 05/28/2009   CHOLHDL 2.8 CALC 05/29/2008   Lab Results  Component Value Date   HGBA1C 5.8 10/17/2010   Lab Results  Component Value Date   ALT 32 06/11/2010   AST 26 06/11/2010   ALKPHOS 42 06/11/2010   BILITOT 0.9 06/11/2010   No results found for this basename: TSH

## 2010-12-25 NOTE — Assessment & Plan Note (Signed)
Remarkably improved. No change in therapy. Followup with primary care.

## 2010-12-25 NOTE — Assessment & Plan Note (Signed)
Under excellent control. No change in therapy.

## 2011-01-09 LAB — CBC
HCT: 36.7
Hemoglobin: 12.5
Hemoglobin: 13
Hemoglobin: 15.2 — ABNORMAL HIGH
MCHC: 34.1
MCHC: 35.1
MCV: 93.4
Platelets: 280
Platelets: 292
RBC: 4.94
RDW: 13.2
RDW: 13.9
WBC: 6.2

## 2011-01-09 LAB — BLOOD GAS, ARTERIAL
Acid-base deficit: 0.4
O2 Content: 1
O2 Saturation: 97
TCO2: 24.8
pCO2 arterial: 36.4
pO2, Arterial: 79 — ABNORMAL LOW

## 2011-01-09 LAB — PROTIME-INR: INR: 1.1

## 2011-01-09 LAB — DIFFERENTIAL
Lymphocytes Relative: 19
Lymphs Abs: 1.2
Monocytes Absolute: 0.4
Monocytes Relative: 6
Neutro Abs: 4.6

## 2011-01-09 LAB — COMPREHENSIVE METABOLIC PANEL
ALT: 21
Albumin: 2.9 — ABNORMAL LOW
Alkaline Phosphatase: 46
Calcium: 8.6
Glucose, Bld: 143 — ABNORMAL HIGH
Potassium: 3.9
Sodium: 136
Total Protein: 5.4 — ABNORMAL LOW

## 2011-01-09 LAB — I-STAT 8, (EC8 V) (CONVERTED LAB)
Acid-base deficit: 1
BUN: 11
Chloride: 106
HCT: 47 — ABNORMAL HIGH
Hemoglobin: 16 — ABNORMAL HIGH
Operator id: 265201
Sodium: 139

## 2011-01-09 LAB — BASIC METABOLIC PANEL
BUN: 9
CO2: 23
Chloride: 103
Glucose, Bld: 185 — ABNORMAL HIGH
Potassium: 3.1 — ABNORMAL LOW
Sodium: 133 — ABNORMAL LOW

## 2011-01-09 LAB — URINALYSIS, ROUTINE W REFLEX MICROSCOPIC
Hgb urine dipstick: NEGATIVE
Ketones, ur: NEGATIVE
Protein, ur: NEGATIVE
Urobilinogen, UA: 1

## 2011-01-09 LAB — POCT CARDIAC MARKERS
Myoglobin, poc: 82
Operator id: 265201

## 2011-01-09 LAB — TYPE AND SCREEN
ABO/RH(D): A POS
Antibody Screen: NEGATIVE

## 2011-01-13 LAB — CBC
HCT: 40.4
HCT: 44
MCHC: 34.4
MCHC: 34.9
MCV: 93.5
MCV: 91.4
Platelets: 306
RBC: 4.32
RBC: 4.82
WBC: 11.8 — ABNORMAL HIGH
WBC: 7.7

## 2011-01-13 LAB — I-STAT 8, (EC8 V) (CONVERTED LAB)
BUN: 15
Bicarbonate: 26.1 — ABNORMAL HIGH
Chloride: 105
Operator id: 239701
pCO2, Ven: 37.7 — ABNORMAL LOW
pH, Ven: 7.448 — ABNORMAL HIGH

## 2011-01-13 LAB — DIFFERENTIAL
Basophils Relative: 1
Eosinophils Absolute: 0.2
Eosinophils Relative: 2
Lymphs Abs: 2.3
Monocytes Absolute: 0.5
Monocytes Relative: 7

## 2011-01-13 LAB — POCT I-STAT CREATININE: Creatinine, Ser: 0.8

## 2011-01-23 ENCOUNTER — Encounter: Payer: Self-pay | Admitting: Family Medicine

## 2011-01-23 ENCOUNTER — Ambulatory Visit (INDEPENDENT_AMBULATORY_CARE_PROVIDER_SITE_OTHER): Payer: Medicare Other | Admitting: Family Medicine

## 2011-01-23 VITALS — BP 124/78 | Temp 98.2°F | Wt 106.0 lb

## 2011-01-23 DIAGNOSIS — H00019 Hordeolum externum unspecified eye, unspecified eyelid: Secondary | ICD-10-CM

## 2011-01-23 MED ORDER — TOBRAMYCIN 0.3 % OP SOLN: 1.0000 [drp] | OPHTHALMIC | Status: AC

## 2011-01-23 NOTE — Patient Instructions (Signed)
Sty A sty (hordeolum) is an infection of a gland in the eyelid located at the base of the eyelash. A sty may develop a white or yellow head of pus. It can be puffy (swollen). Usually, the sty will burst and pus will come out on its own. They do not leave lumps in the eyelid once they drain. A sty is often confused with another form of cyst of the eyelid called a chalazion. Chalazions occur within the eyelid and not on the edge where the bases of the eyelashes are. They often are red, sore and then form firm lumps in the eyelid. CAUSES   Germs (bacteria).   Lasting (chronic) eyelid inflammation.  SYMPTOMS   Tenderness, redness and swelling along the edge of the eyelid at the base of the eyelashes.   Sometimes, there is a white or yellow head of pus. It may or may not drain.  DIAGNOSIS  An ophthalmologist will be able to distinguish between a sty and a chalazion and treat the condition appropriately.  TREATMENT   Styes are typically treated with warm packs (compresses) until drainage occurs.   In rare cases, medicines that kill germs (antibiotics) may be prescribed. These antibiotics may be in the form of drops, cream or pills.   If a hard lump has formed, it is generally necessary to do a small incision and remove the hardened contents of the cyst in a minor surgical procedure done in the office.   In suspicious cases, your caregiver may send the contents of the cyst to the lab to be certain that it is not a rare, but dangerous form of cancer of the glands of the eyelid.  HOME CARE INSTRUCTIONS   Wash your hands often and dry them with a clean towel. Avoid touching your eyelid. This may spread the infection to other parts of the eye.   Apply heat to your eyelid for 10 to 20 minutes, several times a day, to ease pain and help to heal it faster.   Do not squeeze the sty. Allow it to drain on its own. Wash your eyelid carefully 3 to 4 times per day to remove any pus.  SEEK IMMEDIATE  MEDICAL CARE IF:   Your eye becomes painful or puffy (swollen).   Your vision changes.   Your sty does not drain by itself within 3 days.   Your sty comes back within a short period of time, even with treatment.   You have redness (inflammation) around the eye.   You have a fever.  Document Released: 12/31/2004 Document Revised: 12/03/2010 Document Reviewed: 09/04/2008 [facility] Patient Information 2012 [place], [place].  If not resolving in 2 weeks be in touch and will refer to ophthalmology

## 2011-01-23 NOTE — Progress Notes (Signed)
  Subjective:    Patient ID: [name], female    DOB: 02-15-1941, 70 y.o.   MRN: [id number]  HPI  Acute visit. Left upper eyelid stye. Present for approximately one week. Only used warm compresses for one day. No drainage. Minimally tender. No visual changes. No purulent drainage. Diabetes and stable. Continues to exercise regularly.   Review of Systems  Constitutional: Negative for fever and chills.  Eyes: Negative for visual disturbance.       Objective:   Physical Exam  Constitutional: She appears well-developed and well-nourished.  Eyes: Pupils are equal, round, and reactive to light.       Patient has small stye left upper eyelid. Yellowish center. Minimal erythema. Nonfluctuant. Minimally tender. Conjunctiva appears normal. Cornea normal.  Cardiovascular: Normal rate and regular rhythm.   Pulmonary/Chest: Effort normal and breath sounds normal. No respiratory distress. She has no wheezes. She has no rales.          Assessment & Plan:  Small stye left upper lid. Warm compresses several times daily. Consider ophthalmology referral if not resolving over the next one to 2 weeks with warm compresses. TobraDex eyedrops if she has any purulent drainage.

## 2011-01-26 ENCOUNTER — Telehealth: Payer: Self-pay | Admitting: Family Medicine

## 2011-01-26 NOTE — Telephone Encounter (Signed)
I spoke with pt, OV in Friday with sty, it has not come to a head with warm compresses, it is as hard as a rock.  What to do next?  Pt saw Mia Creek, opthalmologist earlier this year for catarac surg?  Return OV here or referral?

## 2011-01-26 NOTE — Telephone Encounter (Signed)
We had discussed with pt-ophthalmology referral is next step.  Have her set up with eye doctor unless she needs referral.

## 2011-01-26 NOTE — Telephone Encounter (Signed)
Pt informed on home VM 

## 2011-01-26 NOTE — Telephone Encounter (Signed)
Pt would like to talk to you about sty on her eye. There is still a hard lump on eye, pt would like to know next step. Pt requesting you contact her

## 2011-04-13 DIAGNOSIS — M949 Disorder of cartilage, unspecified: Secondary | ICD-10-CM | POA: Diagnosis not present

## 2011-04-13 DIAGNOSIS — M899 Disorder of bone, unspecified: Secondary | ICD-10-CM | POA: Diagnosis not present

## 2011-04-13 DIAGNOSIS — Z1211 Encounter for screening for malignant neoplasm of colon: Secondary | ICD-10-CM | POA: Diagnosis not present

## 2011-04-17 ENCOUNTER — Ambulatory Visit (INDEPENDENT_AMBULATORY_CARE_PROVIDER_SITE_OTHER): Payer: Medicare Other | Admitting: Family Medicine

## 2011-04-17 ENCOUNTER — Encounter: Payer: Self-pay | Admitting: Family Medicine

## 2011-04-17 DIAGNOSIS — E119 Type 2 diabetes mellitus without complications: Secondary | ICD-10-CM | POA: Diagnosis not present

## 2011-04-17 DIAGNOSIS — E785 Hyperlipidemia, unspecified: Secondary | ICD-10-CM

## 2011-04-17 DIAGNOSIS — I10 Essential (primary) hypertension: Secondary | ICD-10-CM

## 2011-04-17 LAB — HEPATIC FUNCTION PANEL
Albumin: 4.2 g/dL (ref 3.5–5.2)
Alkaline Phosphatase: 33 U/L — ABNORMAL LOW (ref 39–117)

## 2011-04-17 LAB — LIPID PANEL
LDL Cholesterol: 49 mg/dL (ref 0–99)
Total CHOL/HDL Ratio: 2
Triglycerides: 141 mg/dL (ref 0.0–149.0)
VLDL: 28.2 mg/dL (ref 0.0–40.0)

## 2011-04-17 LAB — HEMOGLOBIN A1C: Hgb A1c MFr Bld: 5.6 % (ref 4.6–6.5)

## 2011-04-17 NOTE — Progress Notes (Signed)
  Subjective:    Patient ID: [name], female    DOB: 02-14-1941, 71 y.o.   MRN: [id number]  HPI  Medical followup. Patient has history of hyperlipidemia, hypertension, and diet/exercise controlled type 2 diabetes. Prior history of spontaneous pneumothorax. No recent respiratory difficulties. Takes lisinopril 10 mg daily for hypertension. On Zocor for hyperlipidemia. Blood pressure stable by home readings. No orthostasis. Blood sugars and stable. Some recent poor dietary compliance is still exercising regularly. Compliant with medications and denies any side effects.  Past Medical History  Diagnosis Date  . Diabetes mellitus   . GERD (gastroesophageal reflux disease)   . Hypertension   . Hyperlipidemia   . Migraines   . Kidney stones   . Cancer 2001    skin, nose  . Pneumothorax on left   . DNR (do not resuscitate) 2009  . Diverticulosis of colon   . Other diseases of lung, not elsewhere classified   . Acute bronchitis   . Abnormal chest x-ray   . Osteoporosis 2012    Dr Nicholas Lose   Past Surgical History  Procedure Date  . Lung surgery 2008    left lung VATS for biopsy and removal of blebs with pleurodesis  . Bunionectomy 2004  . Carpal tunnel release 2000  . Abdominal hysterectomy     fibroids  . Tonsillectomy     reports that she has never smoked. She does not have any smokeless tobacco history on file. Her alcohol and drug histories not on file. family history includes Cancer in her mother and sisters; Diabetes in her sister; Emphysema in her maternal aunt; Hyperlipidemia in her sister; and Pulmonary embolism in her father. No Known Allergies    Review of Systems  Constitutional: Negative for fatigue.  Eyes: Negative for visual disturbance.  Respiratory: Negative for cough, chest tightness, shortness of breath and wheezing.   Cardiovascular: Negative for chest pain, palpitations and leg swelling.  Neurological: Negative for dizziness, seizures, syncope, weakness,  light-headedness and headaches.       Objective:   Physical Exam  Constitutional: She appears well-developed and well-nourished.  HENT:  Mouth/Throat: Oropharynx is clear and moist.  Neck: Neck supple. No thyromegaly present.  Cardiovascular: Normal rate, regular rhythm and normal heart sounds.   Pulmonary/Chest: Effort normal and breath sounds normal. No respiratory distress. She has no wheezes. She has no rales.  Musculoskeletal: She exhibits no edema.  Lymphadenopathy:    She has no cervical adenopathy.          Assessment & Plan:  #1 hypertension stable. Continue current medication #2 hyperlipidemia. Recheck lipid and hepatic panel #3 history of type 2 diabetes. Reassess A1c. Continue regular exercise and weight control

## 2011-04-20 ENCOUNTER — Encounter: Payer: Self-pay | Admitting: *Deleted

## 2011-04-20 NOTE — Progress Notes (Signed)
Quick Note:  Pt aware of results. ______ 

## 2011-05-27 DIAGNOSIS — H2510 Age-related nuclear cataract, unspecified eye: Secondary | ICD-10-CM | POA: Diagnosis not present

## 2011-05-27 DIAGNOSIS — H35369 Drusen (degenerative) of macula, unspecified eye: Secondary | ICD-10-CM | POA: Diagnosis not present

## 2011-05-27 LAB — HM DIABETES EYE EXAM: HM Diabetic Eye Exam: NORMAL

## 2011-06-04 ENCOUNTER — Encounter: Payer: Self-pay | Admitting: Family Medicine

## 2011-06-22 ENCOUNTER — Telehealth: Payer: Self-pay | Admitting: Family Medicine

## 2011-06-22 MED ORDER — LISINOPRIL 10 MG PO TABS: 10.0000 mg | ORAL_TABLET | Freq: Every day | ORAL | Status: DC

## 2011-06-22 MED ORDER — SIMVASTATIN 20 MG PO TABS: 20.0000 mg | ORAL_TABLET | Freq: Every day | ORAL | Status: DC

## 2011-06-22 NOTE — Telephone Encounter (Signed)
Pt called req script for simvastatin (ZOCOR) 20 MG tablet ,lisinopril (PRINIVIL,ZESTRIL) 10 MG tablet to be called in to [facility] on [address].

## 2011-07-09 ENCOUNTER — Other Ambulatory Visit: Payer: Self-pay | Admitting: Gynecology

## 2011-07-09 DIAGNOSIS — Z1231 Encounter for screening mammogram for malignant neoplasm of breast: Secondary | ICD-10-CM

## 2011-08-05 ENCOUNTER — Encounter: Payer: Self-pay | Admitting: Internal Medicine

## 2011-08-05 ENCOUNTER — Ambulatory Visit (INDEPENDENT_AMBULATORY_CARE_PROVIDER_SITE_OTHER): Payer: Medicare Other | Admitting: Internal Medicine

## 2011-08-05 ENCOUNTER — Ambulatory Visit (INDEPENDENT_AMBULATORY_CARE_PROVIDER_SITE_OTHER)
Admission: RE | Admit: 2011-08-05 | Discharge: 2011-08-05 | Disposition: A | Discharge status: Home / Self Care | Payer: Medicare Other | Source: Ambulatory Visit | Attending: Internal Medicine | Admitting: Internal Medicine

## 2011-08-05 VITALS — BP 122/62 | HR 67 | Ht 60.0 in | Wt 111.0 lb

## 2011-08-05 DIAGNOSIS — R918 Other nonspecific abnormal finding of lung field: Secondary | ICD-10-CM | POA: Diagnosis not present

## 2011-08-05 DIAGNOSIS — J439 Emphysema, unspecified: Secondary | ICD-10-CM

## 2011-08-05 DIAGNOSIS — Z9889 Other specified postprocedural states: Secondary | ICD-10-CM | POA: Diagnosis not present

## 2011-08-05 DIAGNOSIS — J449 Chronic obstructive pulmonary disease, unspecified: Secondary | ICD-10-CM

## 2011-08-05 NOTE — Patient Instructions (Signed)
Order- CXR  Hx of fibrocystic lung disease  Please call as needed

## 2011-08-05 NOTE — Progress Notes (Signed)
Patient ID: [name], female    DOB: 06-02-1940, 71 y.o.   MRN: [id number]  HPI 60 yoF never smoker, followed for a diffuse fibrocystic lung condition, out of age range for LAM, but not identified, Hx of bullectomy and lung bx after spontaneous PTX 2008. [name] Complicated by hx acute bronchitis, GERD. Last here July 30, 2009- notes reviewed. She denies any significant events since then. No colds. Occasional mild rhinorhea or congestion blamed on pollen season and managed with Zyrtec. Rarely residual "sticking pains" and she denies dyspnea or any awareness of lung problems. Post thoracotomy numbness is gone.  We went back, reviewed and discussed her path report from 2009.  Told at [facility] this may be congential. Now diabetic- diet controlled. CXR- 08/01/11 IMPRESSION:  Stable examination. No acute cardiopulmonary process.  Original Report Authenticated By: Gerrianne Scale, M.D.   08/05/11-  61 yoF never smoker, followed for a diffuse fibrocystic lung condition, out of age range for LAM, but not identified, Hx of bullectomy and lung bx after spontaneous PTX 2008. [name] Complicated by hx acute bronchitis, GERD    PCP Dr Caryl Never  Patient states "breathing is fine". She does not exert to the point of dyspnea.. Denies cough, phlegm, wheeze, chest pain or palpitation.  ROS-see HPI Constitutional:   No-   weight loss, night sweats, fevers, chills, fatigue, lassitude. HEENT:   No-  headaches, difficulty swallowing, tooth/dental problems, sore throat,       No-  sneezing, itching, ear ache, nasal congestion, post nasal drip,  CV:  No-   chest pain, orthopnea, PND, swelling in lower extremities, anasarca,  dizziness, palpitations Resp: No-   shortness of breath with exertion or at rest.              No-   productive cough,  No non-productive cough,  No- coughing up of blood.              No-   change in color of mucus.  No- wheezing.   Skin: No-   rash or lesions. GI:  No-   heartburn, indigestion,  abdominal pain, nausea, vomiting,  GU:  MS:  No-   joint pain or swelling.   Neuro-     nothing unusual Psych:  No- change in mood or affect. No depression or anxiety.  No memory loss.  OBJ- Physical Exam General- Alert, Oriented, Affect-appropriate, Distress- none acute Skin- rash-none, lesions- none, excoriation- none Lymphadenopathy- none Head- atraumatic            Eyes- Gross vision intact, PERRLA, conjunctivae and secretions clear            Ears- Hearing, canals-normal            Nose- Clear, no-Septal dev, mucus, polyps, erosion, perforation             Throat- Mallampati II , mucosa clear , drainage- none, tonsils- atrophic Neck- flexible , trachea midline, no stridor , thyroid nl, carotid no bruit Chest - symmetrical excursion , unlabored           Heart/CV- RRR , no murmur , no gallop  , no rub, nl s1 s2                           - JVD- minimal , edema- none, stasis changes- none, varices- none           Lung- clear to P&A with unlabored breathing,  wheeze- none, cough- none , dullness-none, rub- none           Chest wall-  Abd-  Br/ Gen/ Rectal- Not done, not indicated Extrem- cyanosis- none, clubbing, none, atrophy- none, strength- nl Neuro- grossly intact to observation

## 2011-08-08 NOTE — Assessment & Plan Note (Signed)
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 Plan-annual chest x-ray. Anticipate followup PFT in a year or so.

## 2011-08-19 ENCOUNTER — Ambulatory Visit
Admission: RE | Admit: 2011-08-19 | Discharge: 2011-08-19 | Disposition: A | Discharge status: Home / Self Care | Payer: Medicare Other | Source: Ambulatory Visit | Attending: Gynecology | Admitting: Gynecology

## 2011-08-19 DIAGNOSIS — Z1231 Encounter for screening mammogram for malignant neoplasm of breast: Secondary | ICD-10-CM | POA: Diagnosis not present

## 2011-08-25 ENCOUNTER — Ambulatory Visit (INDEPENDENT_AMBULATORY_CARE_PROVIDER_SITE_OTHER): Payer: Medicare Other | Admitting: Family Medicine

## 2011-08-25 ENCOUNTER — Encounter: Payer: Self-pay | Admitting: Family Medicine

## 2011-08-25 VITALS — BP 130/80 | Temp 98.1°F | Wt 110.0 lb

## 2011-08-25 DIAGNOSIS — R1012 Left upper quadrant pain: Secondary | ICD-10-CM

## 2011-08-25 LAB — CBC WITH DIFFERENTIAL/PLATELET
Basophils Relative: 0.5 % (ref 0.0–3.0)
Eosinophils Relative: 1.3 % (ref 0.0–5.0)
Lymphocytes Relative: 25.1 % (ref 12.0–46.0)
Monocytes Relative: 7.1 % (ref 3.0–12.0)
Neutrophils Relative %: 66 % (ref 43.0–77.0)
RBC: 4.23 Mil/uL (ref 3.87–5.11)
WBC: 7 10*3/uL (ref 4.5–10.5)

## 2011-08-25 LAB — SEDIMENTATION RATE: Sed Rate: 8 mm/hr (ref 0–22)

## 2011-08-25 NOTE — Progress Notes (Signed)
  Subjective:    Patient ID: [name], female    DOB: 04-Feb-1941, 71 y.o.   MRN: [id number]  HPI  Patient seen with several month history of slightly progressive left upper quadrant abdominal pain. Symptoms are intermittent. Dull ache relatively mild 3/10 intensity at worse. She has minimal radiation toward the left back area. No rash. Pain is mostly left lower rib cage area. Becoming more persistent in recent weeks. Worse with wearing tight clothes and also with positional change. No history of injury. Weight and appetite are stable. Denies any cough or pleuritic pain. No chest pain. No dysuria. No changes stools. She not take any medications for relief. Recent chest x-ray revealed no acute findings. She has history of spontaneous pneumothorax and previous lung surgery. No history of anemia or thrombocytopenia.  Previous colonoscopy unremarkable  Past Medical History  Diagnosis Date  . Diabetes mellitus   . GERD (gastroesophageal reflux disease)   . Hypertension   . Hyperlipidemia   . Migraines   . Kidney stones   . Cancer 2001    skin, nose  . Pneumothorax on left   . DNR (do not resuscitate) 2009  . Diverticulosis of colon   . Other diseases of lung, not elsewhere classified   . Acute bronchitis   . Abnormal chest x-ray   . Osteoporosis 2012    Dr Nicholas Lose   Past Surgical History  Procedure Date  . Lung surgery 2008    left lung VATS for biopsy and removal of blebs with pleurodesis  . Bunionectomy 2004  . Carpal tunnel release 2000  . Abdominal hysterectomy     fibroids  . Tonsillectomy     reports that she has never smoked. She does not have any smokeless tobacco history on file. Her alcohol and drug histories not on file. family history includes Cancer in her mother and sisters; Diabetes in her sister; Emphysema in her maternal aunt; Hyperlipidemia in her sister; and Pulmonary embolism in her father. No Known Allergies    Review of Systems  Constitutional: Negative for  fever, chills, appetite change, fatigue and unexpected weight change.  HENT: Negative for trouble swallowing.   Respiratory: Negative for cough and shortness of breath.   Gastrointestinal: Positive for abdominal pain. Negative for nausea, vomiting, diarrhea, constipation, blood in stool and abdominal distention.  Genitourinary: Negative for dysuria.  Hematological: Negative for adenopathy. Does not bruise/bleed easily.       Objective:   Physical Exam  Constitutional: She appears well-developed and well-nourished.  HENT:  Mouth/Throat: Oropharynx is clear and moist.  Neck: Neck supple. No thyromegaly present.  Cardiovascular: Normal rate and regular rhythm.   Pulmonary/Chest: Effort normal and breath sounds normal. No respiratory distress. She has no wheezes. She has no rales.  Abdominal: Soft. Bowel sounds are normal. She exhibits no mass. There is no rebound and no guarding.       Minimally tender left upper quadrant to deep palpation.  Musculoskeletal: She exhibits no edema.  Lymphadenopathy:    She has no cervical adenopathy.  Skin: No rash noted.          Assessment & Plan:  Left upper quadrant abdominal pain. Question musculoskeletal versus other. No palpable splenomegaly. Symptoms relatively mild. Start with CBC, sedimentation rate, and abdominal ultrasound.

## 2011-08-26 NOTE — Progress Notes (Signed)
Quick Note:  Pt informed ______ 

## 2011-08-27 ENCOUNTER — Ambulatory Visit
Admission: RE | Admit: 2011-08-27 | Discharge: 2011-08-27 | Disposition: A | Discharge status: Home / Self Care | Payer: Medicare Other | Source: Ambulatory Visit | Attending: Family Medicine | Admitting: Family Medicine

## 2011-08-27 DIAGNOSIS — R1012 Left upper quadrant pain: Secondary | ICD-10-CM

## 2011-08-28 NOTE — Progress Notes (Signed)
Quick Note:  Spoke with pt - informed sono normal ______

## 2011-09-14 DIAGNOSIS — H9209 Otalgia, unspecified ear: Secondary | ICD-10-CM | POA: Diagnosis not present

## 2011-09-30 ENCOUNTER — Ambulatory Visit (INDEPENDENT_AMBULATORY_CARE_PROVIDER_SITE_OTHER): Payer: Medicare Other | Admitting: Family Medicine

## 2011-09-30 ENCOUNTER — Encounter: Payer: Self-pay | Admitting: Family Medicine

## 2011-09-30 VITALS — BP 110/68 | Temp 98.0°F | Wt 112.0 lb

## 2011-09-30 DIAGNOSIS — E119 Type 2 diabetes mellitus without complications: Secondary | ICD-10-CM | POA: Diagnosis not present

## 2011-09-30 LAB — HEMOGLOBIN A1C: Hgb A1c MFr Bld: 5.8 % (ref 4.6–6.5)

## 2011-09-30 NOTE — Progress Notes (Signed)
  Subjective:    Patient ID: [name], female    DOB: 18-Sep-1940, 71 y.o.   MRN: [id number]  HPI  Medical followup. History of type 2 diabetes controlled with diet and exercise. She walks regularly. Recently had some left upper quadrant abdominal pain which is still intermittent but overall improved. Ultrasound unremarkable. No dyspepsia. No stool changes. 2 pound weight gain since last visit. No chest pain. Compliant with medications. No symptoms of hyperglycemia.   Review of Systems  Constitutional: Negative for fever, chills, appetite change, fatigue and unexpected weight change.  Eyes: Negative for visual disturbance.  Respiratory: Negative for cough, chest tightness, shortness of breath and wheezing.   Cardiovascular: Negative for chest pain, palpitations and leg swelling.  Genitourinary: Negative for dysuria and frequency.  Neurological: Negative for dizziness, seizures, syncope, weakness, light-headedness and headaches.       Objective:   Physical Exam  Constitutional: She appears well-developed and well-nourished. No distress.  Neck: Neck supple.       No carotid bruits  Cardiovascular: Normal rate and regular rhythm.   Pulmonary/Chest: Effort normal and breath sounds normal. No respiratory distress. She has no wheezes. She has no rales.  Musculoskeletal: She exhibits no edema.          Assessment & Plan:  #1 type 2 diabetes. Diet controlled. Recheck A1c. This is been well controlled the past .  continue regular exercise #2 hypertension stable. Continue lisinopril

## 2011-10-01 DIAGNOSIS — L819 Disorder of pigmentation, unspecified: Secondary | ICD-10-CM | POA: Diagnosis not present

## 2011-10-01 DIAGNOSIS — L821 Other seborrheic keratosis: Secondary | ICD-10-CM | POA: Diagnosis not present

## 2011-10-01 NOTE — Progress Notes (Signed)
Quick Note:  Pt informed ______ 

## 2011-10-09 ENCOUNTER — Ambulatory Visit: Payer: Medicare Other | Admitting: Family Medicine

## 2011-10-14 ENCOUNTER — Ambulatory Visit: Payer: Medicare Other | Admitting: Family Medicine

## 2011-12-25 DIAGNOSIS — H04129 Dry eye syndrome of unspecified lacrimal gland: Secondary | ICD-10-CM | POA: Diagnosis not present

## 2011-12-25 DIAGNOSIS — H26499 Other secondary cataract, unspecified eye: Secondary | ICD-10-CM | POA: Diagnosis not present

## 2011-12-25 DIAGNOSIS — Z961 Presence of intraocular lens: Secondary | ICD-10-CM | POA: Diagnosis not present

## 2012-01-01 ENCOUNTER — Ambulatory Visit (INDEPENDENT_AMBULATORY_CARE_PROVIDER_SITE_OTHER): Payer: Medicare Other

## 2012-01-01 DIAGNOSIS — Z23 Encounter for immunization: Secondary | ICD-10-CM

## 2012-01-01 DIAGNOSIS — H26499 Other secondary cataract, unspecified eye: Secondary | ICD-10-CM | POA: Diagnosis not present

## 2012-01-07 DIAGNOSIS — L821 Other seborrheic keratosis: Secondary | ICD-10-CM | POA: Diagnosis not present

## 2012-01-07 DIAGNOSIS — D1801 Hemangioma of skin and subcutaneous tissue: Secondary | ICD-10-CM | POA: Diagnosis not present

## 2012-01-07 DIAGNOSIS — Z85828 Personal history of other malignant neoplasm of skin: Secondary | ICD-10-CM | POA: Diagnosis not present

## 2012-01-07 DIAGNOSIS — L819 Disorder of pigmentation, unspecified: Secondary | ICD-10-CM | POA: Diagnosis not present

## 2012-03-07 ENCOUNTER — Telehealth: Payer: Self-pay | Admitting: Family Medicine

## 2012-03-07 NOTE — Telephone Encounter (Signed)
Pt informed on home VM 

## 2012-03-07 NOTE — Telephone Encounter (Signed)
Pt would like to take cinnamon capsules 1000 mg per serving with chromium directions take 2 capsules twice a day preferably with meals. Pt would like to know with all her other medications if this is safe to take. Please advise

## 2012-03-07 NOTE — Telephone Encounter (Signed)
I am not aware of any contraindications.

## 2012-04-08 ENCOUNTER — Encounter: Payer: Self-pay | Admitting: Family Medicine

## 2012-04-08 ENCOUNTER — Ambulatory Visit (INDEPENDENT_AMBULATORY_CARE_PROVIDER_SITE_OTHER): Payer: Medicare Other | Admitting: Family Medicine

## 2012-04-08 VITALS — BP 130/72 | Temp 97.9°F | Wt 114.0 lb

## 2012-04-08 DIAGNOSIS — E785 Hyperlipidemia, unspecified: Secondary | ICD-10-CM | POA: Diagnosis not present

## 2012-04-08 DIAGNOSIS — E119 Type 2 diabetes mellitus without complications: Secondary | ICD-10-CM

## 2012-04-08 DIAGNOSIS — I10 Essential (primary) hypertension: Secondary | ICD-10-CM

## 2012-04-08 LAB — HEPATIC FUNCTION PANEL
ALT: 19 U/L (ref 0–35)
Albumin: 4.2 g/dL (ref 3.5–5.2)
Total Protein: 6.8 g/dL (ref 6.0–8.3)

## 2012-04-08 LAB — LIPID PANEL
Cholesterol: 147 mg/dL (ref 0–200)
HDL: 58.9 mg/dL (ref >39.00)
VLDL: 42.8 mg/dL — ABNORMAL HIGH (ref 0.0–40.0)

## 2012-04-08 LAB — LDL CHOLESTEROL, DIRECT: Direct LDL: 74 mg/dL

## 2012-04-08 MED ORDER — LISINOPRIL 10 MG PO TABS: 10.0000 mg | ORAL_TABLET | Freq: Every day | ORAL | Status: DC

## 2012-04-08 MED ORDER — SIMVASTATIN 20 MG PO TABS: 20.0000 mg | ORAL_TABLET | Freq: Every day | ORAL | Status: DC

## 2012-04-08 NOTE — Progress Notes (Signed)
  Subjective:    Patient ID: [name], female    DOB: Dec 09, 1940, 72 y.o.   MRN: [id number]  HPI  Medical followup. Patient history of type 2 diabetes, hypertension, hyperlipidemia. She is doing well. No significant complaints. She remains on simvastatin and, estradiol, baby aspirin, lisinopril. Diabetes has been managed with lifestyle modification. She walks regularly. Very compliant with diet. She's had some mild weight gain since last visit. No symptoms of hyperglycemia. She continues to see gynecologist regularly. Requesting refills of simvastatin and lisinopril. Compliant with all medications.  Past Medical History  Diagnosis Date  . Diabetes mellitus   . GERD (gastroesophageal reflux disease)   . Hypertension   . Hyperlipidemia   . Migraines   . Kidney stones   . Cancer 2001    skin, nose  . Pneumothorax on left   . DNR (do not resuscitate) 2009  . Diverticulosis of colon   . Other diseases of lung, not elsewhere classified   . Acute bronchitis   . Abnormal chest x-ray   . Osteoporosis 2012    Dr Nicholas Lose   Past Surgical History  Procedure Date  . Lung surgery 2008    left lung VATS for biopsy and removal of blebs with pleurodesis  . Bunionectomy 2004  . Carpal tunnel release 2000  . Abdominal hysterectomy     fibroids  . Tonsillectomy     reports that she has never smoked. She does not have any smokeless tobacco history on file. Her alcohol and drug histories not on file. family history includes Cancer in her mother and sisters; Diabetes in her sister; Emphysema in her maternal aunt; Hyperlipidemia in her sister; and Pulmonary embolism in her father. No Known Allergies   Review of Systems  Constitutional: Negative for fatigue.  Eyes: Negative for visual disturbance.  Respiratory: Negative for cough, chest tightness, shortness of breath and wheezing.   Cardiovascular: Negative for chest pain, palpitations and leg swelling.  Neurological: Negative for dizziness,  seizures, syncope, weakness, light-headedness and headaches.       Objective:   Physical Exam  Constitutional: She appears well-developed and well-nourished.  Neck: Neck supple. No thyromegaly present.  Cardiovascular: Normal rate and regular rhythm.   Pulmonary/Chest: Effort normal and breath sounds normal. No respiratory distress. She has no wheezes. She has no rales.  Musculoskeletal: She exhibits no edema.  Skin:       Feet reveal no skin lesions. Good distal foot pulses. Good capillary refill. No calluses. Normal sensation with monofilament testing           Assessment & Plan:  #1 hypertension. Refill lisinopril for one year. Blood pressures have been stable #2 dyslipidemia. Recheck lipid and hepatic panel. Refill simvastatin for one year #3 type 2 diabetes. Diet controlled. Recheck A1c

## 2012-04-12 NOTE — Progress Notes (Signed)
Quick Note:  Pt informed on home VM ______ 

## 2012-04-14 DIAGNOSIS — Z01419 Encounter for gynecological examination (general) (routine) without abnormal findings: Secondary | ICD-10-CM | POA: Diagnosis not present

## 2012-04-14 DIAGNOSIS — N8184 Pelvic muscle wasting: Secondary | ICD-10-CM | POA: Diagnosis not present

## 2012-04-14 DIAGNOSIS — R350 Frequency of micturition: Secondary | ICD-10-CM | POA: Diagnosis not present

## 2012-04-14 DIAGNOSIS — I10 Essential (primary) hypertension: Secondary | ICD-10-CM | POA: Diagnosis not present

## 2012-04-15 ENCOUNTER — Other Ambulatory Visit: Payer: Self-pay | Admitting: Gynecology

## 2012-04-15 ENCOUNTER — Telehealth: Payer: Self-pay | Admitting: Family Medicine

## 2012-04-15 DIAGNOSIS — M810 Age-related osteoporosis without current pathological fracture: Secondary | ICD-10-CM

## 2012-04-15 NOTE — Telephone Encounter (Addendum)
Pt would like blood work results with details.

## 2012-04-15 NOTE — Telephone Encounter (Signed)
Spoke with patient and copy mailed to home address per patient request.

## 2012-04-28 ENCOUNTER — Ambulatory Visit
Admission: RE | Admit: 2012-04-28 | Discharge: 2012-04-28 | Disposition: A | Discharge status: Home / Self Care | Payer: Medicare Other | Source: Ambulatory Visit | Attending: Gynecology | Admitting: Gynecology

## 2012-04-28 ENCOUNTER — Other Ambulatory Visit: Payer: Self-pay | Admitting: *Deleted

## 2012-04-28 DIAGNOSIS — M810 Age-related osteoporosis without current pathological fracture: Secondary | ICD-10-CM

## 2012-04-28 DIAGNOSIS — M899 Disorder of bone, unspecified: Secondary | ICD-10-CM | POA: Diagnosis not present

## 2012-04-28 DIAGNOSIS — M949 Disorder of cartilage, unspecified: Secondary | ICD-10-CM | POA: Diagnosis not present

## 2012-04-28 MED ORDER — ULTILET CLASSIC LANCETS MISC: Status: DC

## 2012-04-28 MED ORDER — GLUCOSE BLOOD VI STRP: ORAL_STRIP | Status: DC

## 2012-05-03 ENCOUNTER — Telehealth: Payer: Self-pay | Admitting: Family Medicine

## 2012-05-03 NOTE — Telephone Encounter (Signed)
Pt would like to speak w/ you concerning blood sugar testing supplies. You spoke w/ pt last week.

## 2012-05-03 NOTE — Telephone Encounter (Signed)
Pt wants to use [facility] for her diabetic supplies, not [facility]

## 2012-06-02 ENCOUNTER — Other Ambulatory Visit: Payer: Self-pay | Admitting: *Deleted

## 2012-06-02 NOTE — Telephone Encounter (Signed)
Opened in error

## 2012-07-12 ENCOUNTER — Other Ambulatory Visit: Payer: Self-pay

## 2012-07-12 DIAGNOSIS — Z1231 Encounter for screening mammogram for malignant neoplasm of breast: Secondary | ICD-10-CM

## 2012-07-25 ENCOUNTER — Emergency Department (HOSPITAL_COMMUNITY): Payer: Medicare Other

## 2012-07-25 ENCOUNTER — Emergency Department (HOSPITAL_COMMUNITY)
Admission: EM | Admit: 2012-07-25 | Discharge: 2012-07-25 | Disposition: A | Discharge status: Home / Self Care | Payer: Medicare Other | Attending: Emergency Medicine | Admitting: Emergency Medicine

## 2012-07-25 ENCOUNTER — Encounter (HOSPITAL_COMMUNITY): Payer: Self-pay | Admitting: *Deleted

## 2012-07-25 ENCOUNTER — Encounter: Payer: Self-pay | Admitting: Emergency Medicine

## 2012-07-25 DIAGNOSIS — R0602 Shortness of breath: Secondary | ICD-10-CM | POA: Diagnosis not present

## 2012-07-25 DIAGNOSIS — Z8709 Personal history of other diseases of the respiratory system: Secondary | ICD-10-CM | POA: Insufficient documentation

## 2012-07-25 DIAGNOSIS — Z79899 Other long term (current) drug therapy: Secondary | ICD-10-CM | POA: Insufficient documentation

## 2012-07-25 DIAGNOSIS — Z87442 Personal history of urinary calculi: Secondary | ICD-10-CM | POA: Insufficient documentation

## 2012-07-25 DIAGNOSIS — E119 Type 2 diabetes mellitus without complications: Secondary | ICD-10-CM | POA: Diagnosis not present

## 2012-07-25 DIAGNOSIS — Z85828 Personal history of other malignant neoplasm of skin: Secondary | ICD-10-CM | POA: Diagnosis not present

## 2012-07-25 DIAGNOSIS — Z8719 Personal history of other diseases of the digestive system: Secondary | ICD-10-CM | POA: Insufficient documentation

## 2012-07-25 DIAGNOSIS — Z7982 Long term (current) use of aspirin: Secondary | ICD-10-CM | POA: Insufficient documentation

## 2012-07-25 DIAGNOSIS — Z8739 Personal history of other diseases of the musculoskeletal system and connective tissue: Secondary | ICD-10-CM | POA: Insufficient documentation

## 2012-07-25 DIAGNOSIS — R0789 Other chest pain: Secondary | ICD-10-CM | POA: Diagnosis not present

## 2012-07-25 DIAGNOSIS — E785 Hyperlipidemia, unspecified: Secondary | ICD-10-CM | POA: Insufficient documentation

## 2012-07-25 DIAGNOSIS — R071 Chest pain on breathing: Secondary | ICD-10-CM | POA: Insufficient documentation

## 2012-07-25 DIAGNOSIS — Z8679 Personal history of other diseases of the circulatory system: Secondary | ICD-10-CM | POA: Diagnosis not present

## 2012-07-25 DIAGNOSIS — I10 Essential (primary) hypertension: Secondary | ICD-10-CM | POA: Insufficient documentation

## 2012-07-25 DIAGNOSIS — R5381 Other malaise: Secondary | ICD-10-CM | POA: Diagnosis not present

## 2012-07-25 DIAGNOSIS — R079 Chest pain, unspecified: Secondary | ICD-10-CM

## 2012-07-25 LAB — COMPREHENSIVE METABOLIC PANEL
Albumin: 4.3 g/dL (ref 3.5–5.2)
Alkaline Phosphatase: 38 U/L — ABNORMAL LOW (ref 39–117)
BUN: 13 mg/dL (ref 6–23)
CO2: 23 mEq/L (ref 19–32)
Chloride: 104 mEq/L (ref 96–112)
Creatinine, Ser: 0.62 mg/dL (ref 0.50–1.10)
GFR calc Af Amer: >90 mL/min (ref >90)
GFR calc non Af Amer: 89 mL/min — ABNORMAL LOW (ref >90)
Glucose, Bld: 118 mg/dL — ABNORMAL HIGH (ref 70–99)
Potassium: 3.5 mEq/L (ref 3.5–5.1)
Total Bilirubin: 0.6 mg/dL (ref 0.3–1.2)

## 2012-07-25 LAB — CBC WITH DIFFERENTIAL/PLATELET
Basophils Relative: 0 % (ref 0–1)
HCT: 38.1 % (ref 36.0–46.0)
Hemoglobin: 14 g/dL (ref 12.0–15.0)
Lymphs Abs: 1.5 10*3/uL (ref 0.7–4.0)
MCHC: 36.7 g/dL — ABNORMAL HIGH (ref 30.0–36.0)
Monocytes Absolute: 0.4 10*3/uL (ref 0.1–1.0)
Monocytes Relative: 6 % (ref 3–12)
Neutro Abs: 5.4 10*3/uL (ref 1.7–7.7)
Neutrophils Relative %: 74 % (ref 43–77)
RBC: 4.27 MIL/uL (ref 3.87–5.11)

## 2012-07-25 LAB — POCT I-STAT TROPONIN I: Troponin i, poc: 0 ng/mL (ref 0.00–0.08)

## 2012-07-25 MED ORDER — SODIUM CHLORIDE 0.9 % IV SOLN: INTRAVENOUS | Status: DC

## 2012-07-25 MED ORDER — IOHEXOL 350 MG/ML SOLN
100.0000 mL | Freq: Once | INTRAVENOUS | Status: AC | PRN
  Administered 2012-07-25: 100 mL via INTRAVENOUS

## 2012-07-25 NOTE — ED Notes (Signed)
ekg and vitals were not done in triage area.  Patient was brought back and left in room.  No vitals and no ekg done.

## 2012-07-25 NOTE — ED Notes (Addendum)
C/o intermittent SOB, left side chest heaviness x 2 weeks. Reports feels same as when she a "collapsed lung" in past. States heaviness & SOB worse when laying flat, DOE. Area non tender, denies cough, n/v, diaphoresis. No pedal edema noted. C/o today feeling lightheaded & generalized weakness. Pt appears very anxious

## 2012-07-25 NOTE — ED Notes (Signed)
Urine sample has been collected from patient if needed.  Sample is at bedside.

## 2012-07-25 NOTE — ED Notes (Signed)
Patient transported to CT 

## 2012-07-25 NOTE — ED Provider Notes (Signed)
History     CSN: [id number]  Arrival date & time 07/25/12  1153   First MD Initiated Contact with Patient 07/25/12 1210      Chief Complaint  Patient presents with  . Shortness of Breath    (Consider location/radiation/quality/duration/timing/severity/associated sxs/prior treatment) The history is provided by the patient and a relative.   72 year old female followed by Dr. Caryl Never and Dr. Maple Hudson from pulmonology. Presents with a two-week history of on and off shortness of breath and chest discomfort mostly left side. Chest discomfort does radiate to the left arm at times. Symptoms slightly worse today but no significant change. Patient in the past has had trouble with pneumothoraxes on the left side last time was 2008 she feels as if that is what's going on this time. In the past though in 2008 they did ask sclerosing of the left lung to prevent recurrent pneumothorax. As stated symptoms have been ongoing for 2 weeks. The chest discomfort is more of a pressure radiating to the left arm described as a 5-6/10.  Past Medical History  Diagnosis Date  . Diabetes mellitus   . GERD (gastroesophageal reflux disease)   . Hypertension   . Hyperlipidemia   . Migraines   . Kidney stones   . Cancer 2001    skin, nose  . Pneumothorax on left   . DNR (do not resuscitate) 2009  . Diverticulosis of colon   . Other diseases of lung, not elsewhere classified   . Acute bronchitis   . Abnormal chest x-ray   . Osteoporosis 2012    Dr Nicholas Lose    Past Surgical History  Procedure Laterality Date  . Lung surgery  2008    left lung VATS for biopsy and removal of blebs with pleurodesis  . Bunionectomy  2004  . Carpal tunnel release  2000  . Abdominal hysterectomy      fibroids  . Tonsillectomy      Family History  Problem Relation Age of Onset  . Cancer Mother     bone  . Diabetes Sister   . Cancer Sister     ovarian  . Hyperlipidemia Sister   . Emphysema Maternal Aunt   . Pulmonary  embolism Father   . Cancer Sister     melanoma    History  Substance Use Topics  . Smoking status: Never Smoker   . Smokeless tobacco: Not on file  . Alcohol Use: No    OB History   Grav Para Term Preterm Abortions TAB SAB Ect Mult Living                  Review of Systems  Constitutional: Positive for fatigue.  HENT: Negative for congestion.   Eyes: Negative for visual disturbance.  Respiratory: Positive for chest tightness and shortness of breath.   Cardiovascular: Positive for chest pain.  Gastrointestinal: Negative for nausea, vomiting and abdominal pain.  Genitourinary: Negative for dysuria.  Musculoskeletal: Negative for back pain.  Skin: Negative for rash.  Neurological: Negative for dizziness, syncope, light-headedness and headaches.  Hematological: Does not bruise/bleed easily.  Psychiatric/Behavioral: Negative for confusion.    Allergies  Review of patient's allergies indicates no known allergies.  Home Medications   Current Outpatient Rx  Name  Route  Sig  Dispense  Refill  . aspirin EC 81 MG tablet   Oral   Take 81 mg by mouth daily.         . Calcium Citrate-Vitamin D (CALCIUM CITRATE + D PO)  Oral   Take 1 tablet by mouth daily.          . Cholecalciferol (VITAMIN D) 1000 UNITS capsule   Oral   Take 1,000 Units by mouth 2 (two) times daily.          . Chromium-Cinnamon (CINNAMON PLUS CHROMIUM) [phone] MCG-MG CAPS   Oral   Take 1 tablet by mouth daily.          [name] estradiol (CLIMARA - DOSED IN MG/24 HR) 0.075 mg/24hr   Transdermal   Place 0.5 patches onto the skin once a week. On Tuesday per [name] MD         . lisinopril (PRINIVIL,ZESTRIL) 10 MG tablet   Oral   Take 1 tablet (10 mg total) by mouth daily. Per Dr Daleen Squibb   90 tablet   3   . Magnesium 250 MG TABS   Oral   Take 250 mg by mouth daily.          . Multiple Vitamin (MULTIVITAMIN PO)   Oral   Take 0.5 tablets by mouth daily. Uses Natural Alternatives  Brand         . simvastatin (ZOCOR) 20 MG tablet   Oral   Take 1 tablet (20 mg total) by mouth at bedtime.   90 tablet   3   . glucose blood test strip      Use as instructed   100 each   11   . Ultilet Classic Lancets MISC      Testing BS once daily   100 each   11     BP 130/65  Pulse 71  Temp(Src) 98 F (36.7 C) (Oral)  Resp 18  SpO2 95%  Physical Exam  Nursing note and vitals reviewed. Constitutional: She is oriented to person, place, and time. She appears well-developed and well-nourished.  HENT:  Head: Normocephalic and atraumatic.  Mouth/Throat: Oropharynx is clear and moist.  Eyes: Conjunctivae and EOM are normal. Pupils are equal, round, and reactive to light.  Neck: Normal range of motion.  Cardiovascular: Normal rate and regular rhythm.   No murmur heard. Pulmonary/Chest: Effort normal and breath sounds normal. No respiratory distress. She has no wheezes. She has no rales. She exhibits no tenderness.  Abdominal: Soft. Bowel sounds are normal. There is no tenderness.  Musculoskeletal: Normal range of motion. She exhibits no edema.  Neurological: She is alert and oriented to person, place, and time. No cranial nerve deficit. She exhibits normal muscle tone. Coordination normal.  Skin: Skin is warm. No rash noted.    ED Course  Procedures (including critical care time)  Labs Reviewed  CBC WITH DIFFERENTIAL - Abnormal; Notable for the following:    MCHC 36.7 (*)    All other components within normal limits  COMPREHENSIVE METABOLIC PANEL - Abnormal; Notable for the following:    Glucose, Bld 118 (*)    Alkaline Phosphatase 38 (*)    GFR calc non Af Amer 89 (*)    All other components within normal limits  POCT I-STAT TROPONIN I  POCT I-STAT TROPONIN I   Ct Angio Chest Pe W/cm &/or Wo Cm  07/25/2012  *RADIOLOGY REPORT*  Clinical Data: Shortness of breath and lethargy.  History of spontaneous pneumothorax.  CT ANGIOGRAPHY CHEST  Technique:   Multidetector CT imaging of the chest using the standard protocol during bolus administration of intravenous contrast. Multiplanar reconstructed images including MIPs were obtained and reviewed to evaluate the vascular anatomy.  Contrast:  OMNIPAQUE IOHEXOL 350 MG/ML SOLN  Comparison: None.  Findings: The pulmonary arteries are adequately opacified.  There is no evidence of acute pulmonary embolism.  Lungs show cystic disease with multiple bilateral parenchymal cysts identified which are not typical for emphysematous bullae.  The largest single area in the right middle lobe measures just over 3 cm.  There is no evidence of pneumothorax. The presence of pulmonary cysts and history of spontaneous pneumothorax raises the possibility of underlying cystic conditions such as lymphangiomyomatosis.  No pulmonary infiltrates, nodules or enlarged lymph nodes are identified.  There is prominence of the ascending thoracic aorta beginning just above the sinotubular junction.  Maximal diameter of the ascending thoracic aorta is 3.9 cm.  Although this is not overtly aneurysmal, the caliber of the ascending aorta is clearly much larger than the descending aorta which measures 2.1 cm.  Recommend correlation with possibility of underlying aortic valvular disease. Echocardiography may be helpful.  The heart size is normal.  No pleural or pericardial fluid is seen. Bony structures are unremarkable.  IMPRESSION:  1.  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  This raises the possibility of underlying cystic conditions such as lymphangiomyomatosis. 2.  Prominence of the ascending thoracic aorta.  Recommend correlation with potential underlying aortic valvular disease. 3.  No evidence of pulmonary embolism or pneumothorax.   Original Report Authenticated By: Irish Lack, M.D.    Dg Chest Portable 1 View  07/25/2012  *RADIOLOGY REPORT*  Clinical Data: Shortness of breath, weakness,  left facial numbness.  PORTABLE CHEST - 1 VIEW  Comparison: None.  Findings: Trachea is midline.  Heart size normal.  Probable calcified scarring at the apex of the right hemithorax. Postoperative changes are seen in the left hemithorax.  Lungs are otherwise clear.  Probable chronic blunting of the left costophrenic angle.  No right pleural fluid.  IMPRESSION:  1.  No acute findings. 2.  Postoperative changes in the left hemithorax with probable associated left pleural thickening at the left costophrenic angle.   Original Report Authenticated By: Leanna Battles, M.D.     Date: 07/25/2012  Rate: 84  Rhythm: normal sinus rhythm  QRS Axis: left  Intervals: normal  ST/T Wave abnormalities: nonspecific ST changes  Conduction Disutrbances:none  Narrative Interpretation:   Old EKG Reviewed: none available  Results for orders placed during the hospital encounter of 07/25/12  CBC WITH DIFFERENTIAL      Result Value Range   WBC 7.4  4.0 - 10.5 K/uL   RBC 4.27  3.87 - 5.11 MIL/uL   Hemoglobin 14.0  12.0 - 15.0 g/dL   HCT 16.1  09.6 - 04.5 %   MCV 89.2  78.0 - 100.0 fL   MCH 32.8  26.0 - 34.0 pg   MCHC 36.7 (*) 30.0 - 36.0 g/dL   RDW 40.9  81.1 - 91.4 %   Platelets 273  150 - 400 K/uL   Neutrophils Relative 74  43 - 77 %   Neutro Abs 5.4  1.7 - 7.7 K/uL   Lymphocytes Relative 20  12 - 46 %   Lymphs Abs 1.5  0.7 - 4.0 K/uL   Monocytes Relative 6  3 - 12 %   Monocytes Absolute 0.4  0.1 - 1.0 K/uL   Eosinophils Relative 1  0 - 5 %   Eosinophils Absolute 0.0  0.0 - 0.7 K/uL   Basophils Relative 0  0 - 1 %   Basophils Absolute 0.0  0.0 - 0.1 K/uL  COMPREHENSIVE METABOLIC PANEL      Result Value Range   Sodium 139  135 - 145 mEq/L   Potassium 3.5  3.5 - 5.1 mEq/L   Chloride 104  96 - 112 mEq/L   CO2 23  19 - 32 mEq/L   Glucose, Bld 118 (*) 70 - 99 mg/dL   BUN 13  6 - 23 mg/dL   Creatinine, Ser 4.09  0.50 - 1.10 mg/dL   Calcium 9.9  8.4 - 81.1 mg/dL   Total Protein 6.8  6.0 - 8.3 g/dL    Albumin 4.3  3.5 - 5.2 g/dL   AST 23  0 - 37 U/L   ALT 16  0 - 35 U/L   Alkaline Phosphatase 38 (*) 39 - 117 U/L   Total Bilirubin 0.6  0.3 - 1.2 mg/dL   GFR calc non Af Amer 89 (*) >90 mL/min   GFR calc Af Amer >90  >90 mL/min  POCT I-STAT TROPONIN I      Result Value Range   Troponin i, poc 0.00  0.00 - 0.08 ng/mL   Comment 3           POCT I-STAT TROPONIN I      Result Value Range   Troponin i, poc 0.00  0.00 - 0.08 ng/mL   Comment 3              1. Shortness of breath   2. Chest pain       MDM  Patient with extensive workup for two-week history of shortness of breath and chest pain. No evidence of acute cardiac event. Troponins x2 have been 0. EKG without acute changes. Workup for the shortness of breath negative for pneumothorax pneumonia pulmonary edema or pulmonary embolism. CT scan does show evidence of bilateral cystic pulmonary process which according to the patient is known by her pulmonologist. Patient has been satting here mid 90s to 100% on room air no supplemental oxygen. Patient can be discharged home and followup with her primary care doctor and pulmonologist in the next few days. Additional workup may be needed but no evidence of any acute event that requires admission here today. Also patient has no leukocytosis no anemia electrolytes without any significant abnormalities.  Did mention to the patient that the possibility of a valvular heart problem has not been completely ruled out. Recommend followup with her doctors echocardiogram to be considered. Patient has not had any near-syncopal event so with exertion so feel it is safe to go home.       Shelda Jakes, MD 07/25/12 [phone]

## 2012-07-25 NOTE — ED Notes (Signed)
Pt thinks that left lung may be partially collapsed and states that this happened twice in 2008.  Pt reports sob and sats 100%.  Pt states at night she feels like a brick is on her chest.  Pt has had previous chest tubes

## 2012-07-26 ENCOUNTER — Telehealth: Payer: Self-pay | Admitting: Internal Medicine

## 2012-07-26 NOTE — Telephone Encounter (Signed)
Pt was seen in the ED yesterday for SOB and CP. She was advised of the following at D/C: Patient can be discharged home and followup with her primary care doctor and pulmonologist in the next few days. Nothing available. Please advise where pt can be worked in Psychiatric nurse and Dr. Maple Hudson thanks   08/05/11 last OV Pending 08/10/12

## 2012-07-27 NOTE — Telephone Encounter (Signed)
Per CY-patient can see her PCP first and then go from there.  I spoke with patient-she is aware and will call her PCP now to try to be seen sooner today or tomorrow morning instead of Friday. Pt will keep me posted as to what her PCP thinks(as far as if pt needs to be seen sooner by CY than 08-10-2012). Nothing more needed at this time. Will sign off on message.

## 2012-07-27 NOTE — Telephone Encounter (Signed)
Pt called back again. Requests to hear from someone soon. Says she feels the same as she did yesterday when she went to ED and was sent home. [phone]. [name]

## 2012-07-27 NOTE — Telephone Encounter (Signed)
Spoke with patient-states she is not feeling well, still having SOB and Left sided chest discomfort with left arm pain with lightheadedness. Pt feels as though this may be her heart and was planning to call her PCP today for appt today if possible instead of waiting Friday 07-29-12. Pt thinks this issue is not from her lungs as her CXR and CT at hospital did not show any blood clots or pneumonia. Pt aware that I am sending this to CY to advise when to bring patient in for follow up. Pt was not in any acute distress while on phone and aware to go to ER if worsened.

## 2012-07-28 ENCOUNTER — Ambulatory Visit (INDEPENDENT_AMBULATORY_CARE_PROVIDER_SITE_OTHER): Payer: Medicare Other | Admitting: Family Medicine

## 2012-07-28 ENCOUNTER — Encounter: Payer: Self-pay | Admitting: Family Medicine

## 2012-07-28 VITALS — BP 120/70 | Temp 98.2°F | Wt 115.0 lb

## 2012-07-28 DIAGNOSIS — R5383 Other fatigue: Secondary | ICD-10-CM

## 2012-07-28 DIAGNOSIS — R0600 Dyspnea, unspecified: Secondary | ICD-10-CM

## 2012-07-28 DIAGNOSIS — R0789 Other chest pain: Secondary | ICD-10-CM | POA: Diagnosis not present

## 2012-07-28 DIAGNOSIS — R0989 Other specified symptoms and signs involving the circulatory and respiratory systems: Secondary | ICD-10-CM | POA: Diagnosis not present

## 2012-07-28 DIAGNOSIS — R5381 Other malaise: Secondary | ICD-10-CM | POA: Diagnosis not present

## 2012-07-28 DIAGNOSIS — R0609 Other forms of dyspnea: Secondary | ICD-10-CM | POA: Diagnosis not present

## 2012-07-28 NOTE — Progress Notes (Signed)
Subjective:    Patient ID: [name], female    DOB: 06-Aug-1940, 72 y.o.   MRN: [id number]  HPI Emergency room followup Patient was seen on 07/25/2012. Presented with 2 week history of intermittent extreme fatigue, shortness of breath, and left-sided chest discomfort. No exertional symptoms. Patient had previous history of pneumothorax and was concerned about the same.  She had extensive workup including labs which were unremarkable. EKG no acute changes. Chest x-ray no acute findings. CT angiography chest no evidence for pulmonary embolus. No pneumothorax. No pulmonary edema CT did show evidence of bilateral cystic pulmonary process which are chronic Mention of prominence of ascending thoracic aorta. No history of underlying aortic valvular disease.  Recent labs reviewed. Patient has continued fatigue. She thinks she's had slightly more dyspnea than usual. This occurs sometimes at rest and sometimes with activity. No fever or chills. No peripheral edema. No myalgias. No arthralgias. No dysuria  Past Medical History  Diagnosis Date  . Diabetes mellitus   . GERD (gastroesophageal reflux disease)   . Hypertension   . Hyperlipidemia   . Migraines   . Kidney stones   . Cancer 2001    skin, nose  . Pneumothorax on left   . DNR (do not resuscitate) 2009  . Diverticulosis of colon   . Other diseases of lung, not elsewhere classified   . Acute bronchitis   . Abnormal chest x-ray   . Osteoporosis 2012    Dr Nicholas Lose   Past Surgical History  Procedure Laterality Date  . Lung surgery  2008    left lung VATS for biopsy and removal of blebs with pleurodesis  . Bunionectomy  2004  . Carpal tunnel release  2000  . Abdominal hysterectomy      fibroids  . Tonsillectomy      reports that she has never smoked. She does not have any smokeless tobacco history on file. She reports that she does not drink alcohol or use illicit drugs. family history includes Cancer in her mother and sisters;  Diabetes in her sister; Emphysema in her maternal aunt; Hyperlipidemia in her sister; and Pulmonary embolism in her father. No Known Allergies    Review of Systems  Constitutional: Positive for fatigue. Negative for fever, chills, appetite change and unexpected weight change.  HENT: Negative for sore throat and trouble swallowing.   Respiratory: Positive for shortness of breath. Negative for cough and wheezing.   Cardiovascular: Negative for palpitations and leg swelling.  Gastrointestinal: Negative for nausea, vomiting and abdominal pain.  Genitourinary: Negative for dysuria.  Musculoskeletal: Negative for arthralgias.  Skin: Negative for rash.  Neurological: Negative for dizziness and syncope.  Hematological: Negative for adenopathy. Does not bruise/bleed easily.  Psychiatric/Behavioral: Negative for confusion.       Objective:   Physical Exam  Constitutional: She is oriented to person, place, and time. She appears well-developed and well-nourished.  HENT:  Mouth/Throat: Oropharynx is clear and moist.  Neck: Neck supple. No thyromegaly present.  No carotid bruits  Cardiovascular: Normal rate and regular rhythm.  Exam reveals no gallop.   Pulmonary/Chest: Effort normal and breath sounds normal. No respiratory distress. She has no wheezes. She has no rales.  Abdominal: Soft. There is no tenderness.  Musculoskeletal: She exhibits no edema.  Lymphadenopathy:    She has no cervical adenopathy.  Neurological: She is alert and oriented to person, place, and time. No cranial nerve deficit. She exhibits normal muscle tone.  No focal weakness. No easy muscle fatigability  Skin: No rash noted.          Assessment & Plan:  Patient presents with nonspecific symptoms of dyspnea, atypical chest pain, intermittent fatigue. Doubt pulmonary etiology. Current pulse oximetry 95%. Recent ER evaluation as above. Mention of prominent ascending thoracic aorta which is probably not related to  her current symptoms. Check further labs- TSH, B12, sedimentation rate. Set up cardiology referral.

## 2012-07-29 ENCOUNTER — Ambulatory Visit: Payer: Medicare Other | Admitting: Family Medicine

## 2012-07-29 NOTE — Progress Notes (Signed)
Quick Note:  Pt informed ______ 

## 2012-08-10 ENCOUNTER — Telehealth: Payer: Self-pay | Admitting: Cardiology

## 2012-08-10 ENCOUNTER — Encounter: Payer: Self-pay | Admitting: Internal Medicine

## 2012-08-10 ENCOUNTER — Ambulatory Visit (INDEPENDENT_AMBULATORY_CARE_PROVIDER_SITE_OTHER): Payer: Medicare Other | Admitting: Internal Medicine

## 2012-08-10 VITALS — BP 112/70 | HR 76 | Ht 60.0 in | Wt 115.0 lb

## 2012-08-10 DIAGNOSIS — L570 Actinic keratosis: Secondary | ICD-10-CM | POA: Diagnosis not present

## 2012-08-10 DIAGNOSIS — J449 Chronic obstructive pulmonary disease, unspecified: Secondary | ICD-10-CM | POA: Diagnosis not present

## 2012-08-10 DIAGNOSIS — J984 Other disorders of lung: Secondary | ICD-10-CM

## 2012-08-10 NOTE — Progress Notes (Signed)
Patient ID: [name], female    DOB: [birth year], 72 y.o.   MRN: [id number]  HPI 57 yoF never smoker, followed for a diffuse fibrocystic lung condition, out of age range for LAM, but not identified, Hx of bullectomy and lung bx after spontaneous PTX 2008. [name] Complicated by hx acute bronchitis, GERD. Last here July 30, 2009- notes reviewed. She denies any significant events since then. No colds. Occasional mild rhinorhea or congestion blamed on pollen season and managed with Zyrtec. Rarely residual "sticking pains" and she denies dyspnea or any awareness of lung problems. Post thoracotomy numbness is gone.  We went back, reviewed and discussed her path report from 2009.  Told at [name] this may be congential. Now diabetic- diet controlled. CXR- 08/01/11 IMPRESSION:  Stable examination. No acute cardiopulmonary process.  Original Report Authenticated By: Gerrianne Scale, M.D.   08/05/11-  32 yoF never smoker, followed for a diffuse fibrocystic lung condition, out of age range for LAM, but not identified, Hx of bullectomy and lung bx after spontaneous PTX 2008. [name] Complicated by hx acute bronchitis, GERD    PCP Dr Caryl Never  Patient states "breathing is fine". She does not exert to the point of dyspnea.. Denies cough, phlegm, wheeze, chest pain or palpitation.  08/10/12- 71 yoF never smoker, followed for a diffuse fibrocystic lung condition, out of age range for LAM, but not identified, Hx of bullectomy and lung bx after spontaneous PTX 2008. [name] Complicated by hx acute bronchitis, GERD    PCP Dr Caryl Never     Sister here FOLLOWS FOR: patient states she is having labored breathing, other concerns going on besides her lungs. Pt would like to know if she is due for another PNA shot-had one about 5 years ago. She went to emergency room feeling tired and short of breath. CT was negative for acute process. Pressure/numbness/tightness feeling through left chest and left arm. Pending cardiology appointment  tomorrow. Transient numbness left lip. Sister was able to confirm there is no family history of lung disease. Patient remains asymptomatic of her cystic lung disease with no cough, wheeze or sputum. CT chest 07/25/12- images reviewed with her and her sister IMPRESSION:  1. 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 This raises the possibility of  underlying cystic conditions such as lymphangiomyomatosis.  2. Prominence of the ascending thoracic aorta. Recommend  correlation with potential underlying aortic valvular disease.  3. No evidence of pulmonary embolism or pneumothorax.  Original Report Authenticated By: Irish Lack, M.D.   ROS-see HPI Constitutional:   No-   weight loss, night sweats, fevers, chills, fatigue, lassitude. HEENT:   No-  headaches, difficulty swallowing, tooth/dental problems, sore throat,       No-  sneezing, itching, ear ache, nasal congestion, post nasal drip,  CV:  +chest pain, no-orthopnea, PND, swelling in lower extremities, anasarca,  dizziness, palpitations Resp: No-  acute shortness of breath with exertion or at rest.              No-   productive cough,  No non-productive cough,  No- coughing up of blood.              No-   change in color of mucus.  No- wheezing.   Skin: No-   rash or lesions. GI:  No-   heartburn, indigestion, abdominal pain, nausea, vomiting,  GU:  MS:  No-   joint pain or swelling.   Neuro-  nothing unusual Psych:  No- change in mood or affect. No depression or anxiety.  No memory loss.  OBJ- Physical Exam General- Alert, Oriented, Affect-appropriate, Distress- none acute Skin- rash-none, lesions- none, excoriation- none Lymphadenopathy- none Head- atraumatic            Eyes- Gross vision intact, PERRLA, conjunctivae and secretions clear            Ears- Hearing, canals-normal            Nose- Clear, no-Septal dev, mucus, polyps, erosion, perforation             Throat-  Mallampati II , mucosa clear , drainage- none, tonsils- atrophic Neck- flexible , trachea midline, no stridor , thyroid nl, carotid no bruit Chest - symmetrical excursion , unlabored           Heart/CV- RRR , no murmur , no gallop  , no rub, nl s1 s2                           - JVD- minimal , edema- none, stasis changes- none, varices- none           Lung- clear to P&A with unlabored breathing, wheeze- none, cough- none , dullness-none, rub- none           Chest wall-  Abd-  Br/ Gen/ Rectal- Not done, not indicated Extrem- cyanosis- none, clubbing, none, atrophy- none, strength- nl Neuro- grossly intact to observation

## 2012-08-10 NOTE — Telephone Encounter (Signed)
WAS SEEN IN ER ON 07-25-12 WAS TOLD BY  ER MD TO F/U WITH PMD  PT DID AS WAS INSTRUCTED  DR BURCHETTE WOULD LIKE PT SEEN   BY  CARD  APPT WAS MADE  AND DUE TO  SCHEDULING ISSUES HAD TO BE RESCHEDULED  PT IS EXPERIENCING  FATIGUE ,LEFT ARM DISCOMFORT  RADIATIING TO BACK AND NECK AND SOB  IS SCHEDULED FOR 08-31-12  PT NEEDS EARLIER APPT PT AWARE  WILL HANG ONTO  MESSAGE AND ONCE  SOME APPT TIMES HAVE  BEEN OPENED UP WILL MOVE APPT  UP./CY

## 2012-08-10 NOTE — Patient Instructions (Addendum)
Please call as needed 

## 2012-08-10 NOTE — Telephone Encounter (Signed)
New Problem:    Patient was called and asked to reschedule her appointment on 08/11/12 due to [facility] having a family emergency and needing to be out of the office.  Patient was uncomfortable being scheduled as far out as 09/01/12 and would like to consult a nurse about her care.  Please call back.

## 2012-08-11 ENCOUNTER — Ambulatory Visit: Payer: Medicare Other | Admitting: Physician Assistant

## 2012-08-11 NOTE — Telephone Encounter (Signed)
Appt moved to 08-12-2012 with Dr. Antoine Poche.

## 2012-08-12 ENCOUNTER — Ambulatory Visit (INDEPENDENT_AMBULATORY_CARE_PROVIDER_SITE_OTHER): Payer: Medicare Other | Admitting: Cardiology

## 2012-08-12 ENCOUNTER — Encounter: Payer: Self-pay | Admitting: Cardiology

## 2012-08-12 VITALS — BP 130/80 | HR 80 | Ht 60.0 in | Wt 113.0 lb

## 2012-08-12 DIAGNOSIS — R9389 Abnormal findings on diagnostic imaging of other specified body structures: Secondary | ICD-10-CM

## 2012-08-12 DIAGNOSIS — R079 Chest pain, unspecified: Secondary | ICD-10-CM | POA: Diagnosis not present

## 2012-08-12 DIAGNOSIS — R0602 Shortness of breath: Secondary | ICD-10-CM

## 2012-08-12 LAB — BRAIN NATRIURETIC PEPTIDE: Pro B Natriuretic peptide (BNP): 15 pg/mL (ref 0.0–100.0)

## 2012-08-12 NOTE — Patient Instructions (Addendum)
The current medical regimen is effective;  continue present plan and medications.  Your physician has requested that you have an exercise tolerance test. For further information please visit [email or web address]. Please also follow instruction sheet, as given.  Your physician has requested that you have an echocardiogram. Echocardiography is a painless test that uses sound waves to create images of your heart. It provides your doctor with information about the size and shape of your heart and how well your heart's chambers and valves are working. This procedure takes approximately one hour. There are no restrictions for this procedure.  Follow up will be based on these results. 

## 2012-08-12 NOTE — Progress Notes (Signed)
HPI The patient presents for evaluation of an abnormal CT scan. She was recently complaining of some chest discomfort and shortness of breath. She presented to the emergency room and I have seen these records. They are a CAT scan suggested that her ACE and the aorta was enlarged at 3.9. This would be lower given her size. She followed up with Dr. Maple Hudson. He is not suggesting a pulmonary etiology but suggesting cardiac followup of her complaints and her CT. She saw Dr. Daleen Squibb in the past for management of hypertension. She has diet controlled diabetes. She has had palpitations.  She said her current symptoms started about one month ago with in particular increasing fatigue that would come and go. I reviewed exquisitely sensitive diary that she keeps daily for all of her symptoms that include episodic shortness of breath, occasional palpitations and some atypical chest discomfort as well as arm discomfort. She cannot bring these along with activities. However, she's been stopping her exercise. She does still have to climb stairs and cannot bring on any symptoms. The symptoms haven't sporadically and on associated with each other or such symptoms as nausea vomiting or excessive diaphoresis. She's not describing PND or orthopnea. She's had no presyncope or syncope.  No Known Allergies  Current Outpatient Prescriptions  Medication Sig Dispense Refill  . aspirin EC 81 MG tablet Take 81 mg by mouth daily.      . Calcium Citrate-Vitamin D (CALCIUM CITRATE + D PO) Take 1 tablet by mouth daily.       . Cholecalciferol (VITAMIN D) 1000 UNITS capsule Take 1,000 Units by mouth 2 (two) times daily.       . Chromium-Cinnamon (CINNAMON PLUS CHROMIUM) [phone] MCG-MG CAPS Take 2 tablets by mouth 2 (two) times daily.       [name] estradiol (CLIMARA - DOSED IN MG/24 HR) 0.075 mg/24hr Place 0.5 patches onto the skin once a week.       [name] glucose blood test strip Use as instructed  100 each  11  . lisinopril (PRINIVIL,ZESTRIL)  10 MG tablet Take 10 mg by mouth daily. Per Dr Caryl Never      . Magnesium 250 MG TABS Take 250 mg by mouth daily.       . Multiple Vitamin (MULTIVITAMIN PO) Take 0.5 tablets by mouth daily. Uses Natural Alternatives Brand      . simvastatin (ZOCOR) 20 MG tablet Take 1 tablet (20 mg total) by mouth at bedtime.  90 tablet  3  . Ultilet Classic Lancets MISC Testing BS once daily  100 each  11   No current facility-administered medications for this visit.    Past Medical History  Diagnosis Date  . Diabetes mellitus   . GERD (gastroesophageal reflux disease)   . Hypertension   . Hyperlipidemia   . Migraines   . Kidney stones   . Cancer 2001    skin, nose  . Pneumothorax on left   . DNR (do not resuscitate) 2009  . Diverticulosis of colon   . Other diseases of lung, not elsewhere classified   . Acute bronchitis   . Abnormal chest x-ray   . Osteoporosis 2012    Dr Nicholas Lose    Past Surgical History  Procedure Laterality Date  . Lung surgery  2008    left lung VATS for biopsy and removal of blebs with pleurodesis  . Bunionectomy  2004  . Carpal tunnel release  2000  . Abdominal hysterectomy      fibroids  .  Tonsillectomy      ROS:  As stated in the HPI and negative for all other systems.  PHYSICAL EXAM BP 130/80  Pulse 80  Ht 5' (1.524 m)  Wt 113 lb (51.256 kg)  BMI 22.07 kg/m2 GENERAL:  Well appearing HEENT:  Pupils equal round and reactive, fundi not visualized, oral mucosa unremarkable NECK:  No jugular venous distention, waveform within normal limits, carotid upstroke brisk and symmetric, no bruits, no thyromegaly LYMPHATICS:  No cervical, inguinal adenopathy LUNGS:  Clear to auscultation bilaterally BACK:  No CVA tenderness CHEST:  Unremarkable HEART:  PMI not displaced or sustained,S1 and S2 within normal limits, no S3, no S4, no clicks, no rubs, mild soft apical systolic brief murmur ABD:  Flat, positive bowel sounds normal in frequency in pitch, no bruits, no  rebound, no guarding, no midline pulsatile mass, no hepatomegaly, no splenomegaly EXT:  2 plus pulses throughout, no edema, no cyanosis no clubbing SKIN:  No rashes no nodules NEURO:  Cranial nerves II through XII grossly intact, motor grossly intact throughout PSYCH:  Cognitively intact, oriented to person place and time   EKG:  Sinus rhythm, rate 84, axis within normal limits, intervals within normal limits, no acute ST-T wave changes.  4/21I will14  ASSESSMENT AND PLAN  ASCENDING AORTIC ENLARGEMENT:  I suspect a variant of normal but I will follow this up with an echocardiogram. She does have a very slight murmur.  DYSPNEA:  This is mild and I do not suspect a cardiac etiology. However, given this and the chest discomfort I will screen with a BNP level and an exercise treadmill test.

## 2012-08-19 ENCOUNTER — Ambulatory Visit (HOSPITAL_COMMUNITY): Payer: Medicare Other | Attending: Internal Medicine

## 2012-08-19 ENCOUNTER — Ambulatory Visit
Admission: RE | Admit: 2012-08-19 | Discharge: 2012-08-19 | Disposition: A | Discharge status: Home / Self Care | Payer: Medicare Other | Source: Ambulatory Visit

## 2012-08-19 DIAGNOSIS — R0609 Other forms of dyspnea: Secondary | ICD-10-CM | POA: Diagnosis not present

## 2012-08-19 DIAGNOSIS — R079 Chest pain, unspecified: Secondary | ICD-10-CM | POA: Diagnosis not present

## 2012-08-19 DIAGNOSIS — I079 Rheumatic tricuspid valve disease, unspecified: Secondary | ICD-10-CM | POA: Diagnosis not present

## 2012-08-19 DIAGNOSIS — I379 Nonrheumatic pulmonary valve disorder, unspecified: Secondary | ICD-10-CM | POA: Diagnosis not present

## 2012-08-19 DIAGNOSIS — R002 Palpitations: Secondary | ICD-10-CM | POA: Diagnosis not present

## 2012-08-19 DIAGNOSIS — I059 Rheumatic mitral valve disease, unspecified: Secondary | ICD-10-CM | POA: Diagnosis not present

## 2012-08-19 DIAGNOSIS — R9389 Abnormal findings on diagnostic imaging of other specified body structures: Secondary | ICD-10-CM | POA: Diagnosis not present

## 2012-08-19 DIAGNOSIS — R5381 Other malaise: Secondary | ICD-10-CM | POA: Insufficient documentation

## 2012-08-19 DIAGNOSIS — E119 Type 2 diabetes mellitus without complications: Secondary | ICD-10-CM | POA: Diagnosis not present

## 2012-08-19 DIAGNOSIS — Z1231 Encounter for screening mammogram for malignant neoplasm of breast: Secondary | ICD-10-CM | POA: Diagnosis not present

## 2012-08-19 DIAGNOSIS — R0989 Other specified symptoms and signs involving the circulatory and respiratory systems: Secondary | ICD-10-CM | POA: Diagnosis not present

## 2012-08-19 DIAGNOSIS — I10 Essential (primary) hypertension: Secondary | ICD-10-CM | POA: Insufficient documentation

## 2012-08-19 NOTE — Progress Notes (Signed)
Echocardiogram performed.  

## 2012-08-20 NOTE — Assessment & Plan Note (Signed)
There has been no apparent progression since we have been following her. She remains asymptomatic so we cannot tell if her cystic disease was congenital. The radiologist raises question of LAM in the differential diagnosis of the images. However on biopsy there was no muscle -see pathology discussion from 2008. Recent malaise is unexplained. We discussed the question of aortic valve disease also raised by the radiologist. She will be seeing the cardiologist tomorrow.

## 2012-08-26 ENCOUNTER — Telehealth: Payer: Self-pay | Admitting: Cardiology

## 2012-08-26 NOTE — Telephone Encounter (Signed)
Per aware of results

## 2012-08-26 NOTE — Telephone Encounter (Signed)
New problem   Pt had echocardiogram on 08/19/12 and want to know her results. Please call pt

## 2012-08-31 ENCOUNTER — Ambulatory Visit: Payer: Medicare Other | Admitting: Physician Assistant

## 2012-09-05 ENCOUNTER — Ambulatory Visit (INDEPENDENT_AMBULATORY_CARE_PROVIDER_SITE_OTHER): Payer: Medicare Other | Admitting: Cardiology

## 2012-09-05 DIAGNOSIS — R0602 Shortness of breath: Secondary | ICD-10-CM

## 2012-09-05 DIAGNOSIS — R079 Chest pain, unspecified: Secondary | ICD-10-CM

## 2012-09-05 NOTE — Progress Notes (Signed)
Exercise Treadmill Test  Pre-Exercise Testing Evaluation Rhythm: normal sinus  Rate: 68                 Test  Exercise Tolerance Test Ordering MD: Angelina Sheriff, MD  Interpreting MD: Angelina Sheriff, MD  Unique Test No: 1  Treadmill:  1  Indication for ETT: chest pain - rule out ischemia  Contraindication to ETT: No   Stress Modality: exercise - treadmill  Cardiac Imaging Performed: non   Protocol: standard Bruce - maximal  Max BP:  195/69  Max MPHR (bpm):  149 85% MPR (bpm):  127  MPHR obtained (bpm):  144 % MPHR obtained:  96  Reached 85% MPHR (min:sec):  4:27 Total Exercise Time (min-sec):  6:00  Workload in METS:  7.0 Borg Scale: 13  Reason ETT Terminated:  desired heart rate attained    ST Segment Analysis At Rest: normal ST segments - no evidence of significant ST depression With Exercise: no evidence of significant ST depression  Other Information Arrhythmia:  No Angina during ETT:  absent (0) Quality of ETT:  diagnostic  ETT Interpretation:  normal - no evidence of ischemia by ST analysis  Comments: The patient had an moderate exercise tolerance.  There was no chest pain.  There was an appropriate level of dyspnea.  There were no arrhythmias, a normal heart rate response.  There was an elevated BP response.  There were no ischemic ST T wave changes and a normal heart rate recovery.  Recommendations: Negative adequate ETT.  No further testing is indicated.  Based on the above I gave the patient a prescription for exercise.  There is no evidence for a cardiac etiology to the dyspnea other than an accelerated BP response.

## 2012-09-22 DIAGNOSIS — L820 Inflamed seborrheic keratosis: Secondary | ICD-10-CM | POA: Diagnosis not present

## 2012-09-22 DIAGNOSIS — D2330 Other benign neoplasm of skin of unspecified part of face: Secondary | ICD-10-CM | POA: Diagnosis not present

## 2012-09-22 DIAGNOSIS — L821 Other seborrheic keratosis: Secondary | ICD-10-CM | POA: Diagnosis not present

## 2012-09-22 DIAGNOSIS — L570 Actinic keratosis: Secondary | ICD-10-CM | POA: Diagnosis not present

## 2012-09-28 ENCOUNTER — Other Ambulatory Visit (INDEPENDENT_AMBULATORY_CARE_PROVIDER_SITE_OTHER): Payer: Medicare Other

## 2012-09-28 DIAGNOSIS — E119 Type 2 diabetes mellitus without complications: Secondary | ICD-10-CM

## 2012-09-28 LAB — HEMOGLOBIN A1C: Hgb A1c MFr Bld: 6 % (ref 4.6–6.5)

## 2012-10-06 ENCOUNTER — Ambulatory Visit (INDEPENDENT_AMBULATORY_CARE_PROVIDER_SITE_OTHER): Payer: Medicare Other | Admitting: Family Medicine

## 2012-10-06 ENCOUNTER — Encounter: Payer: Self-pay | Admitting: Family Medicine

## 2012-10-06 VITALS — BP 128/66 | HR 71 | Temp 97.9°F | Ht 60.0 in | Wt 117.0 lb

## 2012-10-06 DIAGNOSIS — E785 Hyperlipidemia, unspecified: Secondary | ICD-10-CM

## 2012-10-06 DIAGNOSIS — E119 Type 2 diabetes mellitus without complications: Secondary | ICD-10-CM | POA: Diagnosis not present

## 2012-10-06 DIAGNOSIS — I10 Essential (primary) hypertension: Secondary | ICD-10-CM | POA: Diagnosis not present

## 2012-10-06 NOTE — Progress Notes (Signed)
  Subjective:    Patient ID: [name], female    DOB: 23-Sep-1940, 72 y.o.   MRN: [id number]  HPI Patient history type 2 diabetes, hypertension, hyperlipidemia. She manages her type 2 diabetes with lifestyle control. Recent A1c 6.0%. She's had no complications. She is very diligent with diet and exercise. Fasting blood sugars been stable.  She had some recent dyspnea and echocardiogram was basically unremarkable. She feels well a  this time. Walking without difficulty. No chest pains.  Past Medical History  Diagnosis Date  . Diabetes mellitus   . GERD (gastroesophageal reflux disease)   . Hypertension   . Hyperlipidemia   . Migraines   . Kidney stones   . Cancer 2001    skin, nose  . Pneumothorax on left   . DNR (do not resuscitate) 2009  . Diverticulosis of colon   . Other diseases of lung, not elsewhere classified   . Acute bronchitis   . Abnormal chest x-ray   . Osteoporosis 2012    Dr Nicholas Lose   Past Surgical History  Procedure Laterality Date  . Lung surgery  2008    left lung VATS for biopsy and removal of blebs with pleurodesis  . Bunionectomy  2004  . Carpal tunnel release  2000  . Abdominal hysterectomy      fibroids  . Tonsillectomy      reports that she has never smoked. She does not have any smokeless tobacco history on file. She reports that she does not drink alcohol or use illicit drugs. family history includes Cancer in her mother and sisters; Diabetes in her sister; Emphysema in her maternal aunt; Hyperlipidemia in her sister; and Pulmonary embolism in her father. No Known Allergies    Review of Systems  Constitutional: Negative for appetite change, fatigue and unexpected weight change.  Eyes: Negative for visual disturbance.  Respiratory: Negative for cough, chest tightness, shortness of breath and wheezing.   Cardiovascular: Negative for chest pain, palpitations and leg swelling.  Neurological: Negative for dizziness, seizures, syncope, weakness,  light-headedness and headaches.       Objective:   Physical Exam  Constitutional: She is oriented to person, place, and time. She appears well-developed and well-nourished.  HENT:  Right Ear: External ear normal.  Left Ear: External ear normal.  Mouth/Throat: Oropharynx is clear and moist.  Neck: Neck supple. No thyromegaly present.  Cardiovascular: Normal rate and regular rhythm.   Pulmonary/Chest: Effort normal and breath sounds normal. No respiratory distress. She has no wheezes. She has no rales.  Musculoskeletal: She exhibits no edema.  Neurological: She is alert and oriented to person, place, and time.  Psychiatric: She has a normal mood and affect. Her behavior is normal.          Assessment & Plan:  #1 type 2 diabetes. Well controlled off medications. Continue lifestyle management #2 hypertension. Well controlled #3 hyperlipidemia. Recheck lipids at followup in 6 months

## 2013-01-05 DIAGNOSIS — L909 Atrophic disorder of skin, unspecified: Secondary | ICD-10-CM | POA: Diagnosis not present

## 2013-01-05 DIAGNOSIS — L821 Other seborrheic keratosis: Secondary | ICD-10-CM | POA: Diagnosis not present

## 2013-01-05 DIAGNOSIS — Z85828 Personal history of other malignant neoplasm of skin: Secondary | ICD-10-CM | POA: Diagnosis not present

## 2013-01-11 ENCOUNTER — Ambulatory Visit (INDEPENDENT_AMBULATORY_CARE_PROVIDER_SITE_OTHER): Payer: Medicare Other

## 2013-01-11 DIAGNOSIS — Z23 Encounter for immunization: Secondary | ICD-10-CM | POA: Diagnosis not present

## 2013-01-26 DIAGNOSIS — E119 Type 2 diabetes mellitus without complications: Secondary | ICD-10-CM | POA: Diagnosis not present

## 2013-01-26 LAB — HM DIABETES EYE EXAM

## 2013-01-30 ENCOUNTER — Encounter: Payer: Self-pay | Admitting: Family Medicine

## 2013-02-07 DIAGNOSIS — L570 Actinic keratosis: Secondary | ICD-10-CM | POA: Diagnosis not present

## 2013-03-15 DIAGNOSIS — D2330 Other benign neoplasm of skin of unspecified part of face: Secondary | ICD-10-CM | POA: Diagnosis not present

## 2013-03-15 DIAGNOSIS — L570 Actinic keratosis: Secondary | ICD-10-CM | POA: Diagnosis not present

## 2013-04-07 ENCOUNTER — Other Ambulatory Visit (INDEPENDENT_AMBULATORY_CARE_PROVIDER_SITE_OTHER): Payer: Medicare Other

## 2013-04-07 DIAGNOSIS — E119 Type 2 diabetes mellitus without complications: Secondary | ICD-10-CM | POA: Diagnosis not present

## 2013-04-07 LAB — HEMOGLOBIN A1C: Hgb A1c MFr Bld: 5.9 % (ref 4.6–6.5)

## 2013-04-14 ENCOUNTER — Ambulatory Visit (INDEPENDENT_AMBULATORY_CARE_PROVIDER_SITE_OTHER): Payer: Medicare Other | Admitting: Family Medicine

## 2013-04-14 ENCOUNTER — Other Ambulatory Visit: Payer: Self-pay

## 2013-04-14 ENCOUNTER — Encounter: Payer: Self-pay | Admitting: Family Medicine

## 2013-04-14 VITALS — BP 122/68 | HR 73 | Temp 98.2°F | Wt 116.0 lb

## 2013-04-14 DIAGNOSIS — I10 Essential (primary) hypertension: Secondary | ICD-10-CM

## 2013-04-14 DIAGNOSIS — E119 Type 2 diabetes mellitus without complications: Secondary | ICD-10-CM

## 2013-04-14 MED ORDER — SIMVASTATIN 20 MG PO TABS: 20.0000 mg | ORAL_TABLET | Freq: Every day | ORAL | Status: DC

## 2013-04-14 MED ORDER — LISINOPRIL 10 MG PO TABS: 10.0000 mg | ORAL_TABLET | Freq: Every day | ORAL | Status: DC

## 2013-04-14 NOTE — Progress Notes (Signed)
   Subjective:    Patient ID: [name], female    DOB: May 28, 1940, 73 y.o.   MRN: [id number]  HPI Medical followup. Patient history of type 2 diabetes, hypertension, hyperlipidemia, osteoporosis. Inconsistent calcium intake. She takes vitamin D regularly.  Blood pressure well-controlled with lisinopril. She takes simvastatin 20 mg daily for hyperlipidemia. No myalgias. No recent chest pains. She walks regularly for exercise. Blood sugars been extremely well controlled. Recent A1c 5.9%. She's currently managing this with lifestyle alone.  She's had previous flu vaccine earlier this season. Denies any recent falls. No depression issues  Past Medical History  Diagnosis Date  . Diabetes mellitus   . GERD (gastroesophageal reflux disease)   . Hypertension   . Hyperlipidemia   . Migraines   . Kidney stones   . Cancer 2001    skin, nose  . Pneumothorax on left   . DNR (do not resuscitate) 2009  . Diverticulosis of colon   . Other diseases of lung, not elsewhere classified   . Acute bronchitis   . Abnormal chest x-ray   . Osteoporosis 2012    Dr Ubaldo Glassing   Past Surgical History  Procedure Laterality Date  . Lung surgery  2008    left lung VATS for biopsy and removal of blebs with pleurodesis  . Bunionectomy  2004  . Carpal tunnel release  2000  . Abdominal hysterectomy      fibroids  . Tonsillectomy      reports that she has never smoked. She does not have any smokeless tobacco history on file. She reports that she does not drink alcohol or use illicit drugs. family history includes Cancer in her mother, sister, and sister; Diabetes in her sister; Emphysema in her maternal aunt; Hyperlipidemia in her sister; Pulmonary embolism in her father. No Known Allergies    Review of Systems  Constitutional: Negative for fatigue.  Eyes: Negative for visual disturbance.  Respiratory: Negative for cough, chest tightness, shortness of breath and wheezing.   Cardiovascular: Negative for  chest pain, palpitations and leg swelling.  Endocrine: Negative for polydipsia and polyuria.  Neurological: Negative for dizziness, seizures, syncope, weakness, light-headedness and headaches.       Objective:   Physical Exam  Constitutional: She appears well-developed and well-nourished.  HENT:  Mouth/Throat: Oropharynx is clear and moist.  Neck: Neck supple. No thyromegaly present.  Cardiovascular: Normal rate.  Exam reveals no gallop.   Pulmonary/Chest: Effort normal and breath sounds normal. No respiratory distress. She has no wheezes. She has no rales.  Musculoskeletal: She exhibits no edema.          Assessment & Plan:  #1 type 2 diabetes. History of excellent control with lifestyle management. Recheck A1c in 6 months. Continue yearly eye exam #2 hypertension which is well-controlled on low-dose lisinopril. #3 hyperlipidemia on simvastatin. She is nonfasting today. Future labs ordered for next week for lipid and hepatic panel.

## 2013-04-14 NOTE — Progress Notes (Signed)
Pre visit review using our clinic review tool, if applicable. No additional management support is needed unless otherwise documented below in the visit note. 

## 2013-04-19 ENCOUNTER — Telehealth: Payer: Self-pay

## 2013-04-19 NOTE — Telephone Encounter (Signed)
Relevant patient education mailed to patient.  

## 2013-04-25 ENCOUNTER — Other Ambulatory Visit (INDEPENDENT_AMBULATORY_CARE_PROVIDER_SITE_OTHER): Payer: Medicare Other

## 2013-04-25 DIAGNOSIS — I10 Essential (primary) hypertension: Secondary | ICD-10-CM | POA: Diagnosis not present

## 2013-04-25 LAB — HEPATIC FUNCTION PANEL
ALT: 20 U/L (ref 0–35)
AST: 21 U/L (ref 0–37)
Albumin: 4.2 g/dL (ref 3.5–5.2)
Alkaline Phosphatase: 35 U/L — ABNORMAL LOW (ref 39–117)
BILIRUBIN TOTAL: 1 mg/dL (ref 0.3–1.2)
Bilirubin, Direct: 0.1 mg/dL (ref 0.0–0.3)
Total Protein: 6.7 g/dL (ref 6.0–8.3)

## 2013-04-25 LAB — LIPID PANEL
CHOL/HDL RATIO: 2
Cholesterol: 159 mg/dL (ref 0–200)
HDL: 66.5 mg/dL (ref >39.00)
LDL Cholesterol: 73 mg/dL (ref 0–99)
Triglycerides: 96 mg/dL (ref 0.0–149.0)
VLDL: 19.2 mg/dL (ref 0.0–40.0)

## 2013-05-19 ENCOUNTER — Ambulatory Visit (INDEPENDENT_AMBULATORY_CARE_PROVIDER_SITE_OTHER): Payer: Medicare Other | Admitting: Gynecology

## 2013-05-19 ENCOUNTER — Encounter: Payer: Self-pay | Admitting: Gynecology

## 2013-05-19 VITALS — BP 124/74 | Ht 60.0 in | Wt 115.0 lb

## 2013-05-19 DIAGNOSIS — Z79890 Hormone replacement therapy (postmenopausal): Secondary | ICD-10-CM

## 2013-05-19 DIAGNOSIS — N952 Postmenopausal atrophic vaginitis: Secondary | ICD-10-CM

## 2013-05-19 DIAGNOSIS — M810 Age-related osteoporosis without current pathological fracture: Secondary | ICD-10-CM | POA: Diagnosis not present

## 2013-05-19 MED ORDER — ALENDRONATE SODIUM 70 MG PO TABS: 70.0000 mg | ORAL_TABLET | ORAL | Status: DC

## 2013-05-19 NOTE — Patient Instructions (Signed)

## 2013-05-19 NOTE — Progress Notes (Signed)
[name] 09/17/1940 [id number]        72 y.o.  G0P0 new patient with several issues noted below. Former patient of Dr. Ubaldo Glassing.  Past medical history,surgical history, problem list, medications, allergies, family history and social history were all reviewed and documented in the EPIC chart.  ROS:  Performed and pertinent positives and negatives are included in the history, assessment and plan .  Exam: Kim assistant Filed Vitals:   05/19/13 1359  BP: 124/74  Height: 5' (1.524 m)  Weight: 115 lb (52.164 kg)   General appearance  Normal Skin grossly normal Head/Neck normal with no cervical or supraclavicular adenopathy thyroid normal Lungs  clear Cardiac RR, without RMG Abdominal  soft, nontender, without masses, organomegaly or hernia Breasts  examined lying and sitting without masses, retractions, discharge or axillary adenopathy. Pelvic  Ext/BUS/vagina  Normal with generalized atrophic changes.   Adnexa  Without masses or tenderness    Anus and perineum  Normal   Rectovaginal  Normal sphincter tone without palpated masses or tenderness.    Assessment/Plan:  73 y.o. G0P0 female for annual exam.   1. Postmenopausal/menopausal symptoms/atrophic genital changes. Patient is on estradiol patch that she cuts in half. Initially stated it was 0.075 patch but now there is some question as to whether it's a 0.0375 patch.  She is try stopping this but has unacceptable hot flashes. Was also told it was good for her bones. 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  I reviewed the whole issue of HRT with her to include the WHI study with increased risk of stroke, heart attack, DVT and breast cancer. The ACOG and NAMS statements for lowest dose for the shortest period of time reviewed. Transdermal versus oral first-pass effect benefit discussed.  At this point the patient wants to continue on her ERT. Use with advancing age discussed. Patient is going to continue now she does have a  supply at home. If she chooses to continue this year she'll call me when she needs a refill and will clarify the dosage. 2. Osteoporosis. DEXA 04/2012 T score -3.2. Had transiently been on alendronate number of years ago but has not been on this for while. I reviewed the risks of fracture with a low T score and advancing age. Options for treatment reviewed. Ultimately after a lengthy discussion she agrees with a trial of alendronate 70 mg weekly. How to take the medication and the side effect/risk profile discussed to include empty stomach remaining upright GERD osteonecrosis of the jaw and atypical fractures.  Patient will go ahead and start and call me if she has any problems. We'll plan on repeating her DEXA next year at a 2 year interval from her last DEXA and a one-year interval from treatment. Increased calcium vitamin D reviewed. 3. Pap smear 2012. No history of abnormal Pap smears previously. Status post hysterectomy for benign indications. At the age 27. We both agree with stop screening and she is comfortable with this per current screening guidelines. 4. Mammography 08/2012. Will continue with annual mammography. SBE monthly reviewed. 5. Colonoscopy 2005. Repeat this coming year a 10 year interval. 6. Health maintenance. No routine blood work done as this is all done through her primary physician's office. Follow up one year, sooner as needed.   Note: This document was prepared with digital dictation and possible smart phrase technology. Any transcriptional errors that result from this process are unintentional.   Anastasio Auerbach MD, 3:01 PM 05/19/2013

## 2013-05-20 LAB — URINALYSIS W MICROSCOPIC + REFLEX CULTURE
Bacteria, UA: NONE SEEN
Bilirubin Urine: NEGATIVE
Casts: NONE SEEN
Crystals: NONE SEEN
GLUCOSE, UA: NEGATIVE mg/dL
Hgb urine dipstick: NEGATIVE
Ketones, ur: NEGATIVE mg/dL
LEUKOCYTES UA: NEGATIVE
Nitrite: NEGATIVE
PH: 6.5 (ref 5.0–8.0)
PROTEIN: NEGATIVE mg/dL
SQUAMOUS EPITHELIAL / LPF: NONE SEEN
Specific Gravity, Urine: 1.013 (ref 1.005–1.030)
Urobilinogen, UA: 0.2 mg/dL (ref 0.0–1.0)

## 2013-05-22 ENCOUNTER — Other Ambulatory Visit: Payer: Self-pay | Admitting: Gynecology

## 2013-05-22 ENCOUNTER — Telehealth: Payer: Self-pay

## 2013-05-22 MED ORDER — ALENDRONATE SODIUM 70 MG PO TABS: 70.0000 mg | ORAL_TABLET | ORAL | Status: DC

## 2013-05-22 NOTE — Telephone Encounter (Signed)
Patient needs her Fosamax Rx that Dr. Loetta Rough prescribed recently to be sent to [facility] [facility] and a 90day supply rx. Rx sent.

## 2013-06-21 DIAGNOSIS — L819 Disorder of pigmentation, unspecified: Secondary | ICD-10-CM | POA: Diagnosis not present

## 2013-06-21 DIAGNOSIS — L570 Actinic keratosis: Secondary | ICD-10-CM | POA: Diagnosis not present

## 2013-06-21 DIAGNOSIS — D2330 Other benign neoplasm of skin of unspecified part of face: Secondary | ICD-10-CM | POA: Diagnosis not present

## 2013-06-21 DIAGNOSIS — Z85828 Personal history of other malignant neoplasm of skin: Secondary | ICD-10-CM | POA: Diagnosis not present

## 2013-06-21 DIAGNOSIS — L821 Other seborrheic keratosis: Secondary | ICD-10-CM | POA: Diagnosis not present

## 2013-06-21 DIAGNOSIS — D1801 Hemangioma of skin and subcutaneous tissue: Secondary | ICD-10-CM | POA: Diagnosis not present

## 2013-07-03 ENCOUNTER — Encounter: Payer: Self-pay | Admitting: Internal Medicine

## 2013-07-17 ENCOUNTER — Other Ambulatory Visit: Payer: Self-pay

## 2013-07-17 DIAGNOSIS — Z1231 Encounter for screening mammogram for malignant neoplasm of breast: Secondary | ICD-10-CM

## 2013-08-10 ENCOUNTER — Encounter: Payer: Self-pay | Admitting: Internal Medicine

## 2013-08-10 ENCOUNTER — Ambulatory Visit (INDEPENDENT_AMBULATORY_CARE_PROVIDER_SITE_OTHER): Payer: Medicare Other | Admitting: Internal Medicine

## 2013-08-10 VITALS — BP 122/78 | HR 78 | Ht 60.0 in | Wt 115.4 lb

## 2013-08-10 DIAGNOSIS — J449 Chronic obstructive pulmonary disease, unspecified: Secondary | ICD-10-CM | POA: Diagnosis not present

## 2013-08-10 DIAGNOSIS — J439 Emphysema, unspecified: Principal | ICD-10-CM

## 2013-08-10 DIAGNOSIS — J984 Other disorders of lung: Secondary | ICD-10-CM

## 2013-08-10 DIAGNOSIS — J4489 Other specified chronic obstructive pulmonary disease: Secondary | ICD-10-CM

## 2013-08-10 NOTE — Patient Instructions (Signed)
Please call as needed 

## 2013-08-10 NOTE — Progress Notes (Signed)
Patient ID: JENIKA CHIEM, female    DOB: 13-Jan-1941, 73 y.o.   MRN: [id number]  HPI 63 yoF never smoker, followed for a diffuse fibrocystic lung condition, out of age range for LAM, but not identified, Hx of bullectomy and lung bx after spontaneous PTX 2008. [name] Complicated by hx acute bronchitis, GERD. Last here July 30, 2009- notes reviewed. She denies any significant events since then. No colds. Occasional mild rhinorhea or congestion blamed on pollen season and managed with Zyrtec. Rarely residual "sticking pains" and she denies dyspnea or any awareness of lung problems. Post thoracotomy numbness is gone.  We went back, reviewed and discussed her path report from 2009.  Told at [facility] this may be congential. Now diabetic- diet controlled. CXR- 08/01/11 IMPRESSION:  Stable examination. No acute cardiopulmonary process.  Original Report Authenticated By: Vivia Ewing, M.D.   08/05/11-  47 yoF never smoker, followed for a diffuse fibrocystic lung condition, out of age range for LAM, but not identified, Hx of bullectomy and lung bx after spontaneous PTX 2008. [name] Complicated by hx acute bronchitis, GERD    PCP Dr Elease Hashimoto  Patient states "breathing is fine". She does not exert to the point of dyspnea.. Denies cough, phlegm, wheeze, chest pain or palpitation.  08/10/12- 71 yoF never smoker, followed for a diffuse fibrocystic lung condition, out of age range for LAM, but not identified, Hx of bullectomy and lung bx after spontaneous PTX 2008. [name] Complicated by hx acute bronchitis, GERD    PCP Dr Elease Hashimoto     Sister here FOLLOWS FOR: patient states she is having labored breathing, other concerns going on besides her lungs. Pt would like to know if she is due for another PNA shot-had one about 5 years ago. She went to emergency room feeling tired and short of breath. CT was negative for acute process. Pressure/numbness/tightness feeling through left chest and left arm. Pending cardiology appointment  tomorrow. Transient numbness left lip. Sister was able to confirm there is no family history of lung disease. Patient remains asymptomatic of her cystic lung disease with no cough, wheeze or sputum. CT chest 07/25/12- images reviewed with her and her sister IMPRESSION:  1. 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 This raises the possibility of  underlying cystic conditions such as lymphangiomyomatosis.  2. Prominence of the ascending thoracic aorta. Recommend  correlation with potential underlying aortic valvular disease.  3. No evidence of pulmonary embolism or pneumothorax.  Original Report Authenticated By: Aletta Edouard, M.D.   08/10/13- 23 yoF never smoker, followed for a diffuse fibrocystic lung condition, out of age range for LAM, but not identified, Hx of bullectomy and lung bx after spontaneous PTX 2008. [name] Complicated by hx acute bronchitis, GERD    PCP Dr Elease Hashimoto     FOLLOWS FOR:  Breathing doing well.  Pt reports getting over a cold and still having some sinus pressure and PND Otherwise has done well with no significant respiratory events. She does not recognize routine cough or dyspnea on exertion.  ROS-see HPI Constitutional:   No-   weight loss, night sweats, fevers, chills, fatigue, lassitude. HEENT:   No-  headaches, difficulty swallowing, tooth/dental problems, sore throat,       No-  sneezing, itching, ear ache, nasal congestion, post nasal drip,  CV:  +chest pain, no-orthopnea, PND, swelling in lower extremities, anasarca,  dizziness, palpitations Resp: No-  acute shortness of breath with exertion or at  rest.              No-   productive cough,  No non-productive cough,  No- coughing up of blood.              No-   change in color of mucus.  No- wheezing.   Skin: No-   rash or lesions. GI:  No-   heartburn, indigestion, abdominal pain, nausea, vomiting,  GU:  MS:  No-   joint pain or swelling.   Neuro-     nothing  unusual Psych:  No- change in mood or affect. No depression or anxiety.  No memory loss.  OBJ- Physical Exam General- Alert, Oriented, Affect-appropriate, Distress- none acute. Looks well Skin- rash-none, lesions- none, excoriation- none Lymphadenopathy- none Head- atraumatic            Eyes- Gross vision intact, PERRLA, conjunctivae and secretions clear            Ears- Hearing, canals-normal            Nose- Clear, no-Septal dev, mucus, polyps, erosion, perforation             Throat- Mallampati II , mucosa clear , drainage- none, tonsils- atrophic Neck- flexible , trachea midline, no stridor , thyroid nl, carotid no bruit Chest - symmetrical excursion , unlabored           Heart/CV- RRR , no murmur heard , no gallop  , no rub, nl s1 s2                           - JVD- minimal , edema- none, stasis changes- none, varices- none           Lung- clear to P&A with unlabored breathing, wheeze- none, cough- none ,                     dullness-none, rub- none           Chest wall-  Abd-  Br/ Gen/ Rectal- Not done, not indicated Extrem- cyanosis- none, clubbing, none, atrophy- none, strength- nl Neuro- grossly intact to observation

## 2013-08-21 ENCOUNTER — Encounter: Payer: Self-pay | Admitting: Internal Medicine

## 2013-08-23 ENCOUNTER — Ambulatory Visit
Admission: RE | Admit: 2013-08-23 | Discharge: 2013-08-23 | Disposition: A | Discharge status: Home / Self Care | Payer: Medicare Other | Source: Ambulatory Visit

## 2013-08-23 DIAGNOSIS — Z1231 Encounter for screening mammogram for malignant neoplasm of breast: Secondary | ICD-10-CM

## 2013-09-18 ENCOUNTER — Encounter: Payer: Self-pay | Admitting: Internal Medicine

## 2013-09-18 NOTE — Assessment & Plan Note (Signed)
Dx'd at biopsy 2008 with surgical closure of pneumothorax. Path report on file. Cysts and fibrosis. No muscle seen so not called LAM. 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 Plan-conservative followup

## 2013-10-04 ENCOUNTER — Other Ambulatory Visit (INDEPENDENT_AMBULATORY_CARE_PROVIDER_SITE_OTHER): Payer: Medicare Other

## 2013-10-04 DIAGNOSIS — E119 Type 2 diabetes mellitus without complications: Secondary | ICD-10-CM | POA: Diagnosis not present

## 2013-10-04 LAB — HEMOGLOBIN A1C: Hgb A1c MFr Bld: 5.9 % (ref 4.6–6.5)

## 2013-10-10 ENCOUNTER — Ambulatory Visit (AMBULATORY_SURGERY_CENTER): Payer: Self-pay

## 2013-10-10 VITALS — Ht 60.0 in | Wt 115.6 lb

## 2013-10-10 DIAGNOSIS — Z1211 Encounter for screening for malignant neoplasm of colon: Secondary | ICD-10-CM

## 2013-10-10 MED ORDER — MOVIPREP 100 G PO SOLR: ORAL | Status: DC

## 2013-10-10 NOTE — Progress Notes (Signed)
Per pt, no allergies to soy or egg products.Pt not taking any weight loss meds or using  O2 at home. 

## 2013-10-11 ENCOUNTER — Telehealth: Payer: Self-pay | Admitting: Family Medicine

## 2013-10-11 ENCOUNTER — Ambulatory Visit (INDEPENDENT_AMBULATORY_CARE_PROVIDER_SITE_OTHER): Payer: Medicare Other | Admitting: Family Medicine

## 2013-10-11 ENCOUNTER — Encounter: Payer: Self-pay | Admitting: Family Medicine

## 2013-10-11 VITALS — BP 122/70 | HR 73 | Temp 98.1°F | Wt 115.0 lb

## 2013-10-11 DIAGNOSIS — E785 Hyperlipidemia, unspecified: Secondary | ICD-10-CM | POA: Diagnosis not present

## 2013-10-11 DIAGNOSIS — I10 Essential (primary) hypertension: Secondary | ICD-10-CM

## 2013-10-11 DIAGNOSIS — Z23 Encounter for immunization: Secondary | ICD-10-CM | POA: Diagnosis not present

## 2013-10-11 DIAGNOSIS — E119 Type 2 diabetes mellitus without complications: Secondary | ICD-10-CM

## 2013-10-11 NOTE — Progress Notes (Signed)
Pre visit review using our clinic review tool, if applicable. No additional management support is needed unless otherwise documented below in the visit note. 

## 2013-10-11 NOTE — Progress Notes (Signed)
Subjective:    Patient ID: [name], female    DOB: December 27, 1940, 73 y.o.   MRN: [id number]  HPI  Patient seen for routine medical followup. She has history of diet-controlled type 2 diabetes, hypertension, hyperlipidemia, osteoporosis. Her gynecologist recently started her on Fosamax. She takes calcium and vitamin D. Blood pressure stable. Walks for exercise. On simvastatin for hyperlipidemia. She's had some occasional right lower extremity pains and these are not claudication-type symptoms. She has some muscle aches and occasional sharp fleeting pains right lower extremity. No back pain. No numbness or weakness. No loss of bladder or bowel control.  History of diet-controlled type 2 diabetes. Recent A1c 5.9% which is stable.  Past Medical History  Diagnosis Date  . Diabetes mellitus     Borderline/no meds  . GERD (gastroesophageal reflux disease)   . Hypertension   . Hyperlipidemia   . Migraines     during menopause  . Kidney stones     1 time  . Cancer 2001    skin, nose  . Pneumothorax on left   . DNR (do not resuscitate) 2009  . Diverticulosis of colon   . Other diseases of lung, not elsewhere classified   . Acute bronchitis   . Abnormal chest x-ray   . Osteoporosis 2012    Dr Ubaldo Glassing  . Skin tag of anus   . Cataract   . Post-operative nausea and vomiting     1 time   Past Surgical History  Procedure Laterality Date  . Lung surgery  2008    left lung VATS for biopsy and removal of blebs with pleurodesis  . Bunionectomy  2004    left  . Carpal tunnel release  2000    right   . Abdominal hysterectomy  1994    fibroids/ complete  . Tonsillectomy      as child  . Oophorectomy      BSO  . Cataract extraction      Bil    reports that she has never smoked. She has never used smokeless tobacco. She reports that she does not drink alcohol or use illicit drugs. family history includes Cancer in her father, mother, and sister; Diabetes in her sister; Emphysema in her  maternal aunt; Hyperlipidemia in her sister; Ovarian cancer in her sister; Pulmonary embolism in her father. No Known Allergies   Review of Systems  Constitutional: Negative for fatigue and unexpected weight change.  Eyes: Negative for visual disturbance.  Respiratory: Negative for cough, chest tightness, shortness of breath and wheezing.   Cardiovascular: Negative for chest pain, palpitations and leg swelling.  Endocrine: Negative for polydipsia and polyuria.  Neurological: Negative for dizziness, seizures, syncope, weakness, light-headedness and headaches.       Objective:   Physical Exam  Constitutional: She appears well-developed and well-nourished.  Neck: Neck supple. No thyromegaly present.  Cardiovascular: Normal rate and regular rhythm.  Exam reveals no gallop.   No murmur heard. Pulmonary/Chest: Effort normal and breath sounds normal. No respiratory distress. She has no wheezes. She has no rales.  Musculoskeletal: She exhibits no edema.  Lymphadenopathy:    She has no cervical adenopathy.  Skin:  Feet reveal no skin lesions. Good distal foot pulses. Good capillary refill. No calluses. Normal sensation with monofilament testing           Assessment & Plan:  #1 type 2 diabetes. History of excellent control. Continue lifestyle management #2 hypertension stable at goal. Continue current medications #3 hyperlipidemia. Repeat  lipids at follow up #4 health maintenance. Prevnar 13 given. Continue yearly flu vaccine

## 2013-10-11 NOTE — Telephone Encounter (Signed)
Relevant patient education mailed to patient.  

## 2013-10-24 ENCOUNTER — Encounter: Payer: Self-pay | Admitting: Internal Medicine

## 2013-10-24 ENCOUNTER — Ambulatory Visit (AMBULATORY_SURGERY_CENTER): Payer: Medicare Other | Admitting: Internal Medicine

## 2013-10-24 VITALS — BP 123/51 | HR 69 | Temp 97.9°F | Resp 16 | Ht 60.0 in | Wt 115.0 lb

## 2013-10-24 DIAGNOSIS — I10 Essential (primary) hypertension: Secondary | ICD-10-CM | POA: Diagnosis not present

## 2013-10-24 DIAGNOSIS — D126 Benign neoplasm of colon, unspecified: Secondary | ICD-10-CM | POA: Diagnosis not present

## 2013-10-24 DIAGNOSIS — Z1211 Encounter for screening for malignant neoplasm of colon: Secondary | ICD-10-CM | POA: Diagnosis not present

## 2013-10-24 HISTORY — PX: COLONOSCOPY: SHX174

## 2013-10-24 MED ORDER — SODIUM CHLORIDE 0.9 % IV SOLN: 500.0000 mL | INTRAVENOUS | Status: DC

## 2013-10-24 NOTE — Op Note (Signed)
[place]  [address]. [place], [address]   COLONOSCOPY PROCEDURE REPORT  PATIENT: [name], [name]  MR#: [id number] BIRTHDATE: 12/22/1940 , 72  yrs. old GENDER: Female ENDOSCOPIST: Eustace Quail, MD REFERRED LZ:[id number] Recall, PROCEDURE DATE:  10/24/2013 PROCEDURE:   Colonoscopy with snare polypectomy x1 First Screening Colonoscopy - Avg.  risk and is 50 yrs.  old or older - No.  Prior Negative Screening - Now for repeat screening. 10 or more years since last screening  History of Adenoma - Now for follow-up colonoscopy & has been > or = to 3 yrs.  N/A  Polyps Removed Today? Yes. ASA CLASS:   Class II INDICATIONS:average risk screening.  . Negative index colonoscopy June 2005 MEDICATIONS: MAC sedation, administered by CRNA and propofol (Diprivan) 200mg  IV  DESCRIPTION OF PROCEDURE:   After the risks benefits and alternatives of the procedure were thoroughly explained, informed consent was obtained.  A digital rectal exam revealed no abnormalities of the rectum.   The LB [id number]  endoscope was introduced through the anus and advanced to the cecum, which was identified by both the appendix and ileocecal valve. No adverse events experienced.   The quality of the prep was excellent, using MoviPrep  The instrument was then slowly withdrawn as the colon was fully examined.  COLON FINDINGS: A sessile polyp measuring 7 mm in size was found at the cecum.  A polypectomy was performed with a cold snare.  The resection was complete and the polyp tissue was completely retrieved.   Moderate diverticulosis was noted  in the right and left colon.   The colon mucosa was otherwise normal.  Retroflexed views revealed internal hemorrhoids. The time to cecum=1 minutes 42 seconds.  Withdrawal time=11 minutes 29 seconds.  The scope was withdrawn and the procedure completed. COMPLICATIONS: There were no complications.  ENDOSCOPIC IMPRESSION: 1.   Sessile polyp  measuring 7 mm in size was found at the cecum; polypectomy was performed with a cold snare 2.   Moderate diverticulosis was noted in the right colon and left colon 3.   The colon mucosa was otherwise normal  RECOMMENDATIONS: 1. Follow up colonoscopy in 5 years   eSigned:  Eustace Quail, MD 10/24/2013 12:06 PM   cc: The Patient and [name], MD

## 2013-10-24 NOTE — Progress Notes (Signed)
Called to room to assist during endoscopic procedure.  Patient ID and intended procedure confirmed with present staff. Received instructions for my participation in the procedure from the performing physician.  

## 2013-10-24 NOTE — Patient Instructions (Signed)
YOU HAD AN ENDOSCOPIC PROCEDURE TODAY AT THE [facility]: Refer to the procedure report that was given to you for any specific questions about what was found during the examination.  If the procedure report does not answer your questions, please call your gastroenterologist to clarify.  If you requested that your care partner not be given the details of your procedure findings, then the procedure report has been included in a sealed envelope for you to review at your convenience later.  YOU SHOULD EXPECT: Some feelings of bloating in the abdomen. Passage of more gas than usual.  Walking can help get rid of the air that was put into your GI tract during the procedure and reduce the bloating. If you had a lower endoscopy (such as a colonoscopy or flexible sigmoidoscopy) you may notice spotting of blood in your stool or on the toilet paper. If you underwent a bowel prep for your procedure, then you may not have a normal bowel movement for a few days.  DIET: Your first meal following the procedure should be a light meal and then it is ok to progress to your normal diet.  A half-sandwich or bowl of soup is an example of a good first meal.  Heavy or fried foods are harder to digest and may make you feel nauseous or bloated.  Likewise meals heavy in dairy and vegetables can cause extra gas to form and this can also increase the bloating.  Drink plenty of fluids but you should avoid alcoholic beverages for 24 hours.  ACTIVITY: Your care partner should take you home directly after the procedure.  You should plan to take it easy, moving slowly for the rest of the day.  You can resume normal activity the day after the procedure however you should NOT DRIVE or use heavy machinery for 24 hours (because of the sedation medicines used during the test).    SYMPTOMS TO REPORT IMMEDIATELY: A gastroenterologist can be reached at any hour.  During normal business hours, 8:30 AM to 5:00 PM Monday through Friday,  call [phone].  After hours and on weekends, please call the GI answering service at [phone] who will take a message and have the physician on call contact you.   Following lower endoscopy (colonoscopy or flexible sigmoidoscopy):  Excessive amounts of blood in the stool  Significant tenderness or worsening of abdominal pains  Swelling of the abdomen that is new, acute  Fever of 100F or higher  FOLLOW UP: If any biopsies were taken you will be contacted by phone or by letter within the next 1-3 weeks.  Call your gastroenterologist if you have not heard about the biopsies in 3 weeks.  Our staff will call the home number listed on your records the next business day following your procedure to check on you and address any questions or concerns that you may have at that time regarding the information given to you following your procedure. This is a courtesy call and so if there is no answer at the home number and we have not heard from you through the emergency physician on call, we will assume that you have returned to your regular daily activities without incident.  SIGNATURES/CONFIDENTIALITY: You and/or your care partner have signed paperwork which will be entered into your electronic medical record.  These signatures attest to the fact that that the information above on your After Visit Summary has been reviewed and is understood.  Full responsibility of the confidentiality of this   discharge information lies with you and/or your care-partner.   Resume medications. Information on polyps, diverticulosis and high fiber diet with discharge instructions.

## 2013-10-24 NOTE — Progress Notes (Signed)
A/ox3 pleased with MAC, report to Sheila RN 

## 2013-10-25 ENCOUNTER — Telehealth: Payer: Self-pay | Admitting: *Deleted

## 2013-10-25 NOTE — Telephone Encounter (Signed)
  Follow up Call-  Call back number 10/24/2013  Post procedure Call Back phone  # [phone]  Permission to leave phone message Yes     Patient questions:  Do you have a fever, pain , or abdominal swelling? No. Pain Score  0 *  Have you tolerated food without any problems? Yes.    Have you been able to return to your normal activities? Yes.    Do you have any questions about your discharge instructions: Diet   No. Medications  No. Follow up visit  No.  Do you have questions or concerns about your Care? No.  Actions: * If pain score is 4 or above: No action needed, pain <4.

## 2013-10-31 ENCOUNTER — Encounter: Payer: Self-pay | Admitting: Internal Medicine

## 2013-11-03 DIAGNOSIS — D485 Neoplasm of uncertain behavior of skin: Secondary | ICD-10-CM | POA: Diagnosis not present

## 2013-11-03 DIAGNOSIS — L821 Other seborrheic keratosis: Secondary | ICD-10-CM | POA: Diagnosis not present

## 2013-11-03 DIAGNOSIS — D210 Benign neoplasm of connective and other soft tissue of head, face and neck: Secondary | ICD-10-CM | POA: Diagnosis not present

## 2013-12-28 ENCOUNTER — Ambulatory Visit (INDEPENDENT_AMBULATORY_CARE_PROVIDER_SITE_OTHER): Payer: Medicare Other | Admitting: Family Medicine

## 2013-12-28 DIAGNOSIS — Z23 Encounter for immunization: Secondary | ICD-10-CM | POA: Diagnosis not present

## 2014-01-31 DIAGNOSIS — E119 Type 2 diabetes mellitus without complications: Secondary | ICD-10-CM | POA: Diagnosis not present

## 2014-02-08 ENCOUNTER — Telehealth: Payer: Self-pay

## 2014-02-08 NOTE — Telephone Encounter (Signed)
Former patient Dr. Ubaldo Glassing who saw you 05/19/13.  She said she discussed the patch she was on with you at the time but did not need Rx then. She wants Rx now.  She said that she gets the 0.075 mg patch and cuts it in half. She does this because it is "cheaper". She stated she could get a month's supply and it actually lasts for two.  I told her that we  have to put the correct directions on it how she is using it and she did not want me to do that as that would not allow her to get two months for the price of one.   Please advise.

## 2014-02-08 NOTE — Telephone Encounter (Signed)
Okay to refill the patch with routine sig 1 twice-weekly #8 refill through February 2016 appointment

## 2014-02-09 ENCOUNTER — Other Ambulatory Visit: Payer: Self-pay | Admitting: Gynecology

## 2014-02-09 MED ORDER — ESTRADIOL 0.075 MG/24HR TD PTWK: 0.0750 mg | MEDICATED_PATCH | TRANSDERMAL | Status: DC

## 2014-02-09 NOTE — Telephone Encounter (Signed)
Written rx mailed to patient at her request. Patient informed.

## 2014-02-09 NOTE — Telephone Encounter (Signed)
If she is on Vivelle or equivalent that's a twice a week patch.therefore it would be #8 for a one-month supply. If she chooses to cut them in half that is her option. Obviously if she is on a once a week patch like a Climara or generic weekly then it is #4 for the month

## 2014-02-09 NOTE — Telephone Encounter (Signed)
I need to clarify. This is a once a week patch.  If I prescribe her four she will have a two months supply.  Your note below said "routine sig-1 twice-weekly".

## 2014-02-21 LAB — HM DIABETES EYE EXAM

## 2014-02-22 ENCOUNTER — Encounter: Payer: Self-pay | Admitting: Family Medicine

## 2014-04-09 ENCOUNTER — Other Ambulatory Visit: Payer: Self-pay | Admitting: Gynecology

## 2014-04-09 DIAGNOSIS — M810 Age-related osteoporosis without current pathological fracture: Secondary | ICD-10-CM

## 2014-04-10 ENCOUNTER — Other Ambulatory Visit (INDEPENDENT_AMBULATORY_CARE_PROVIDER_SITE_OTHER): Payer: Medicare Other

## 2014-04-10 DIAGNOSIS — E785 Hyperlipidemia, unspecified: Secondary | ICD-10-CM | POA: Diagnosis not present

## 2014-04-10 DIAGNOSIS — E119 Type 2 diabetes mellitus without complications: Secondary | ICD-10-CM | POA: Diagnosis not present

## 2014-04-10 LAB — LIPID PANEL
CHOL/HDL RATIO: 3
Cholesterol: 173 mg/dL (ref 0–200)
HDL: 65.6 mg/dL (ref >39.00)
LDL CALC: 84 mg/dL (ref 0–99)
NONHDL: 107.4
TRIGLYCERIDES: 117 mg/dL (ref 0.0–149.0)
VLDL: 23.4 mg/dL (ref 0.0–40.0)

## 2014-04-10 LAB — HEMOGLOBIN A1C: Hgb A1c MFr Bld: 6.2 % (ref 4.6–6.5)

## 2014-04-13 ENCOUNTER — Ambulatory Visit (INDEPENDENT_AMBULATORY_CARE_PROVIDER_SITE_OTHER): Payer: Medicare Other | Admitting: Family Medicine

## 2014-04-13 ENCOUNTER — Other Ambulatory Visit: Payer: Self-pay

## 2014-04-13 ENCOUNTER — Encounter: Payer: Self-pay | Admitting: Family Medicine

## 2014-04-13 VITALS — BP 120/68 | HR 71 | Temp 97.9°F | Wt 117.0 lb

## 2014-04-13 DIAGNOSIS — I10 Essential (primary) hypertension: Secondary | ICD-10-CM | POA: Diagnosis not present

## 2014-04-13 DIAGNOSIS — E785 Hyperlipidemia, unspecified: Secondary | ICD-10-CM

## 2014-04-13 DIAGNOSIS — E119 Type 2 diabetes mellitus without complications: Secondary | ICD-10-CM

## 2014-04-13 MED ORDER — SIMVASTATIN 20 MG PO TABS: 20.0000 mg | ORAL_TABLET | Freq: Every day | ORAL | Status: DC

## 2014-04-13 MED ORDER — LISINOPRIL 10 MG PO TABS: 10.0000 mg | ORAL_TABLET | Freq: Every day | ORAL | Status: DC

## 2014-04-13 NOTE — Progress Notes (Signed)
Subjective:    Patient ID: [name], female    DOB: [birth year], 74 y.o.   MRN: [id number]  HPI Patient seen for routine medical follow-up. The following issues were addressed today:  Type 2 diabetes. History of good control. Last A1c few days ago 6.2%. Poor compliance with walking and diet recently. She is currently not treated with any medications. No symptoms of polyuria or polydipsia  Dyslipidemia on simvastatin. No myalgias. Compliant with therapy. Recent lipids reviewed with patient and stable compared to last year.  Hypertension which is been stable on lisinopril. No dizziness. No chest pains. She has osteoporosis and takes calcium and vitamin D. She is on Fosamax. No GERD symptoms  Past Medical History  Diagnosis Date  . Diabetes mellitus     Borderline/no meds  . GERD (gastroesophageal reflux disease)   . Hypertension   . Hyperlipidemia   . Migraines     during menopause  . Kidney stones     1 time  . Cancer 2001    skin, nose  . Pneumothorax on left   . DNR (do not resuscitate) 2009  . Diverticulosis of colon   . Other diseases of lung, not elsewhere classified   . Acute bronchitis   . Abnormal chest x-ray   . Osteoporosis 2012    Dr Ubaldo Glassing  . Skin tag of anus   . Cataract   . Post-operative nausea and vomiting     1 time   Past Surgical History  Procedure Laterality Date  . Lung surgery  2008    left lung VATS for biopsy and removal of blebs with pleurodesis  . Bunionectomy  2004    left  . Carpal tunnel release  2000    right   . Abdominal hysterectomy  1994    fibroids/ complete  . Tonsillectomy      as child  . Oophorectomy      BSO  . Cataract extraction      Bil    reports that she has never smoked. She has never used smokeless tobacco. She reports that she does not drink alcohol or use illicit drugs. family history includes Cancer in her father, mother, and sister; Diabetes in her sister; Emphysema in her maternal aunt; Hyperlipidemia in  her sister; Ovarian cancer in her sister; Pulmonary embolism in her father. No Known Allergies    Review of Systems  Constitutional: Negative for fatigue.  Eyes: Negative for visual disturbance.  Respiratory: Negative for cough, chest tightness, shortness of breath and wheezing.   Cardiovascular: Negative for chest pain, palpitations and leg swelling.  Gastrointestinal: Negative for abdominal pain.  Endocrine: Negative for polydipsia and polyuria.  Genitourinary: Negative for dysuria.  Neurological: Negative for dizziness, seizures, syncope, weakness, light-headedness and headaches.       Objective:   Physical Exam  Constitutional: She appears well-developed and well-nourished.  Neck: Neck supple. No thyromegaly present.  Cardiovascular: Normal rate and regular rhythm.   Pulmonary/Chest: Effort normal and breath sounds normal. No respiratory distress. She has no wheezes. She has no rales.  Musculoskeletal: She exhibits no edema.  Psychiatric: She has a normal mood and affect. Her behavior is normal.          Assessment & Plan:  #1 type 2 diabetes. History of good control. Slight bump in recent A1c. Get back to diligent exercise and dietary control. Recheck A1c 6 months #2 hypertension well controlled. Refill lisinopril for one year #3 hyperlipidemia. Lipids reviewed. Stable. Refill simvastatin  for one year #4 osteoporosis. Continue Fosamax and calcium and vitamin D. Stressed importance of staying on her therapy

## 2014-04-13 NOTE — Progress Notes (Signed)
Pre visit review using our clinic review tool, if applicable. No additional management support is needed unless otherwise documented below in the visit note. 

## 2014-04-13 NOTE — Addendum Note (Signed)
Addended by: Noe Gens E on: 04/13/2014 12:08 PM   Modules accepted: Orders, Medications

## 2014-04-24 ENCOUNTER — Ambulatory Visit (INDEPENDENT_AMBULATORY_CARE_PROVIDER_SITE_OTHER): Payer: Medicare Other

## 2014-04-24 ENCOUNTER — Encounter: Payer: Self-pay | Admitting: Gynecology

## 2014-04-24 DIAGNOSIS — M810 Age-related osteoporosis without current pathological fracture: Secondary | ICD-10-CM | POA: Diagnosis not present

## 2014-05-25 ENCOUNTER — Encounter: Payer: BLUE CROSS/BLUE SHIELD | Admitting: Gynecology

## 2014-05-25 ENCOUNTER — Encounter: Payer: Self-pay | Admitting: Gynecology

## 2014-05-25 ENCOUNTER — Ambulatory Visit (INDEPENDENT_AMBULATORY_CARE_PROVIDER_SITE_OTHER): Payer: Medicare Other | Admitting: Gynecology

## 2014-05-25 VITALS — BP 120/74 | Ht 60.0 in | Wt 119.0 lb

## 2014-05-25 DIAGNOSIS — Z01419 Encounter for gynecological examination (general) (routine) without abnormal findings: Secondary | ICD-10-CM

## 2014-05-25 DIAGNOSIS — Z79890 Hormone replacement therapy (postmenopausal): Secondary | ICD-10-CM | POA: Diagnosis not present

## 2014-05-25 DIAGNOSIS — M810 Age-related osteoporosis without current pathological fracture: Secondary | ICD-10-CM | POA: Diagnosis not present

## 2014-05-25 DIAGNOSIS — N952 Postmenopausal atrophic vaginitis: Secondary | ICD-10-CM

## 2014-05-25 MED ORDER — ALENDRONATE SODIUM 70 MG PO TABS: 70.0000 mg | ORAL_TABLET | ORAL | Status: DC

## 2014-05-25 MED ORDER — ESTRADIOL 0.0375 MG/24HR TD PTWK: 0.0375 mg | MEDICATED_PATCH | TRANSDERMAL | Status: DC

## 2014-05-25 NOTE — Progress Notes (Signed)
[name] 08-27-1940 [id number]        74 y.o.  G0P0 for breast and pelvic exam. Several issues noted below.  Past medical history,surgical history, problem list, medications, allergies, family history and social history were all reviewed and documented as reviewed in the EPIC chart.  ROS:  Performed with pertinent positives and negatives included in the history, assessment and plan.   Additional significant findings :  none   Exam: Kim Counsellor Vitals:   05/25/14 1417  BP: 120/74  Height: 5' (1.524 m)  Weight: 119 lb (53.978 kg)   General appearance:  Normal affect, orientation and appearance. Skin: Grossly normal HEENT: Without gross lesions.  No cervical or supraclavicular adenopathy. Thyroid normal.  Lungs:  Clear without wheezing, rales or rhonchi Cardiac: RR, without RMG Abdominal:  Soft, nontender, without masses, guarding, rebound, organomegaly or hernia Breasts:  Examined lying and sitting without masses, retractions, discharge or axillary adenopathy. Pelvic:  Ext/BUS/vagina with generalized atrophic changes  Adnexa  Without masses or tenderness    Anus and perineum  Normal   Rectovaginal  Normal sphincter tone without palpated masses or tenderness.    Assessment/Plan:  74 y.o. G0P0 female for breast and pelvic exam.   1. Postmenopausal/atrophic genital changes. Status post TAH/BSO for leiomyoma. Continues on one half of Climara 0.075 mg patch weekly. Has tried stopping but had unacceptable hot flushes and night sweats. I again reviewed with her the issues of ERT to include increased risk of stroke heart attack DVT possible breast cancer. The issues of cutting her patch in half also discussed. ACOG and NAMS statements for lowest dose, shortest of time and weaning reviewed. This point patient strongly wants to continuously quality-of-life issue and I refilled 1 year so that she'll be using the equivalent of 0.0375 mg patch. 2. Osteoporosis.  Started on Fosamax last  year. Has done well without side effects reported. DEXA 2014 T score -3.2.  Follow up DEXA 04/2014 T score -2.9. Will continue on her Fosamax and annual prescription provided. We'll plan continuing through her next DEXA and then consider a drug-free holiday if continues to do well. Increased calcium vitamin D reviewed. Benefits of low-dose estrogen patch for bones also reviewed. 3. Mammography 08/2013. Continue with mammography. SBE monthly reviewed. 4. Colonoscopy 2015. Repeat at their recommended interval. 5. Pap smear 2012. No Pap smear done today. No history of abnormal Pap smears previously. Status post hysterectomy for benign indications. Over the age of 77. We both agree to stop screening she is comfortable with this. 6. Health maintenance. No routine blood work done as this is done at her primary physician's office. Follow up 1 year, sooner as needed.     Anastasio Auerbach MD, 2:42 PM 05/25/2014

## 2014-05-25 NOTE — Patient Instructions (Signed)
You may obtain a copy of any labs that were done today by logging onto MyChart as outlined in the instructions provided with your AVS (after visit summary). The office will not call with normal lab results but certainly if there are any significant abnormalities then we will contact you.   Health Maintenance, Female A healthy lifestyle and preventative care can promote health and wellness.  Maintain regular health, dental, and eye exams.  Eat a healthy diet. Foods like vegetables, fruits, whole grains, low-fat dairy products, and lean protein foods contain the nutrients you need without too many calories. Decrease your intake of foods high in solid fats, added sugars, and salt. Get information about a proper diet from your caregiver, if necessary.  Regular physical exercise is one of the most important things you can do for your health. Most adults should get at least 150 minutes of moderate-intensity exercise (any activity that increases your heart rate and causes you to sweat) each week. In addition, most adults need muscle-strengthening exercises on 2 or more days a week.   Maintain a healthy weight. The body mass index (BMI) is a screening tool to identify possible weight problems. It provides an estimate of body fat based on height and weight. Your caregiver can help determine your BMI, and can help you achieve or maintain a healthy weight. For adults 20 years and older:  A BMI below 18.5 is considered underweight.  A BMI of 18.5 to 24.9 is normal.  A BMI of 25 to 29.9 is considered overweight.  A BMI of 30 and above is considered obese.  Maintain normal blood lipids and cholesterol by exercising and minimizing your intake of saturated fat. Eat a balanced diet with plenty of fruits and vegetables. Blood tests for lipids and cholesterol should begin at age 61 and be repeated every 5 years. If your lipid or cholesterol levels are high, you are over 50, or you are a high risk for heart  disease, you may need your cholesterol levels checked more frequently.Ongoing high lipid and cholesterol levels should be treated with medicines if diet and exercise are not effective.  If you smoke, find out from your caregiver how to quit. If you do not use tobacco, do not start.  Lung cancer screening is recommended for adults aged 33 80 years who are at high risk for developing lung cancer because of a history of smoking. Yearly low-dose computed tomography (CT) is recommended for people who have at least a 30-pack-year history of smoking and are a current smoker or have quit within the past 15 years. A pack year of smoking is smoking an average of 1 pack of cigarettes a day for 1 year (for example: 1 pack a day for 30 years or 2 packs a day for 15 years). Yearly screening should continue until the smoker has stopped smoking for at least 15 years. Yearly screening should also be stopped for people who develop a health problem that would prevent them from having lung cancer treatment.  If you are pregnant, do not drink alcohol. If you are breastfeeding, be very cautious about drinking alcohol. If you are not pregnant and choose to drink alcohol, do not exceed 1 drink per day. One drink is considered to be 12 ounces (355 mL) of beer, 5 ounces (148 mL) of wine, or 1.5 ounces (44 mL) of liquor.  Avoid use of street drugs. Do not share needles with anyone. Ask for help if you need support or instructions about stopping  the use of drugs.  High blood pressure causes heart disease and increases the risk of stroke. Blood pressure should be checked at least every 1 to 2 years. Ongoing high blood pressure should be treated with medicines, if weight loss and exercise are not effective.  If you are 59 to 74 years old, ask your caregiver if you should take aspirin to prevent strokes.  Diabetes screening involves taking a blood sample to check your fasting blood sugar level. This should be done once every 3  years, after age 91, if you are within normal weight and without risk factors for diabetes. Testing should be considered at a younger age or be carried out more frequently if you are overweight and have at least 1 risk factor for diabetes.  Breast cancer screening is essential preventative care for women. You should practice "breast self-awareness." This means understanding the normal appearance and feel of your breasts and may include breast self-examination. Any changes detected, no matter how small, should be reported to a caregiver. Women in their 66s and 30s should have a clinical breast exam (CBE) by a caregiver as part of a regular health exam every 1 to 3 years. After age 101, women should have a CBE every year. Starting at age 100, women should consider having a mammogram (breast X-ray) every year. Women who have a family history of breast cancer should talk to their caregiver about genetic screening. Women at a high risk of breast cancer should talk to their caregiver about having an MRI and a mammogram every year.  Breast cancer gene (BRCA)-related cancer risk assessment is recommended for women who have family members with BRCA-related cancers. BRCA-related cancers include breast, ovarian, tubal, and peritoneal cancers. Having family members with these cancers may be associated with an increased risk for harmful changes (mutations) in the breast cancer genes BRCA1 and BRCA2. Results of the assessment will determine the need for genetic counseling and BRCA1 and BRCA2 testing.  The Pap test is a screening test for cervical cancer. Women should have a Pap test starting at age 57. Between ages 25 and 35, Pap tests should be repeated every 2 years. Beginning at age 37, you should have a Pap test every 3 years as long as the past 3 Pap tests have been normal. If you had a hysterectomy for a problem that was not cancer or a condition that could lead to cancer, then you no longer need Pap tests. If you are  between ages 50 and 76, and you have had normal Pap tests going back 10 years, you no longer need Pap tests. If you have had past treatment for cervical cancer or a condition that could lead to cancer, you need Pap tests and screening for cancer for at least 20 years after your treatment. If Pap tests have been discontinued, risk factors (such as a new sexual partner) need to be reassessed to determine if screening should be resumed. Some women have medical problems that increase the chance of getting cervical cancer. In these cases, your caregiver may recommend more frequent screening and Pap tests.  The human papillomavirus (HPV) test is an additional test that may be used for cervical cancer screening. The HPV test looks for the virus that can cause the cell changes on the cervix. The cells collected during the Pap test can be tested for HPV. The HPV test could be used to screen women aged 44 years and older, and should be used in women of any age  who have unclear Pap test results. After the age of 55, women should have HPV testing at the same frequency as a Pap test.  Colorectal cancer can be detected and often prevented. Most routine colorectal cancer screening begins at the age of 44 and continues through age 20. However, your caregiver may recommend screening at an earlier age if you have risk factors for colon cancer. On a yearly basis, your caregiver may provide home test kits to check for hidden blood in the stool. Use of a small camera at the end of a tube, to directly examine the colon (sigmoidoscopy or colonoscopy), can detect the earliest forms of colorectal cancer. Talk to your caregiver about this at age 86, when routine screening begins. Direct examination of the colon should be repeated every 5 to 10 years through age 13, unless early forms of pre-cancerous polyps or small growths are found.  Hepatitis C blood testing is recommended for all people born from [birth year] through 1965 and any  individual with known risks for hepatitis C.  Practice safe sex. Use condoms and avoid high-risk sexual practices to reduce the spread of sexually transmitted infections (STIs). Sexually active women aged 36 and younger should be checked for Chlamydia, which is a common sexually transmitted infection. Older women with new or multiple partners should also be tested for Chlamydia. Testing for other STIs is recommended if you are sexually active and at increased risk.  Osteoporosis is a disease in which the bones lose minerals and strength with aging. This can result in serious bone fractures. The risk of osteoporosis can be identified using a bone density scan. Women ages 20 and over and women at risk for fractures or osteoporosis should discuss screening with their caregivers. Ask your caregiver whether you should be taking a calcium supplement or vitamin D to reduce the rate of osteoporosis.  Menopause can be associated with physical symptoms and risks. Hormone replacement therapy is available to decrease symptoms and risks. You should talk to your caregiver about whether hormone replacement therapy is right for you.  Use sunscreen. Apply sunscreen liberally and repeatedly throughout the day. You should seek shade when your shadow is shorter than you. Protect yourself by wearing long sleeves, pants, a wide-brimmed hat, and sunglasses year round, whenever you are outdoors.  Notify your caregiver of new moles or changes in moles, especially if there is a change in shape or color. Also notify your caregiver if a mole is larger than the size of a pencil eraser.  Stay current with your immunizations. Document Released: 10/06/2010 Document Revised: 07/18/2012 Document Reviewed: 10/06/2010 [facility] Patient Information 2014 [employer].

## 2014-05-26 LAB — URINALYSIS W MICROSCOPIC + REFLEX CULTURE
BILIRUBIN URINE: NEGATIVE
Bacteria, UA: NONE SEEN
CRYSTALS: NONE SEEN
Casts: NONE SEEN
Glucose, UA: NEGATIVE mg/dL
HGB URINE DIPSTICK: NEGATIVE
Ketones, ur: NEGATIVE mg/dL
LEUKOCYTES UA: NEGATIVE
Nitrite: NEGATIVE
PH: 5 (ref 5.0–8.0)
Protein, ur: NEGATIVE mg/dL
Specific Gravity, Urine: 1.013 (ref 1.005–1.030)
Urobilinogen, UA: 0.2 mg/dL (ref 0.0–1.0)

## 2014-06-08 DIAGNOSIS — H9201 Otalgia, right ear: Secondary | ICD-10-CM | POA: Diagnosis not present

## 2014-06-13 DIAGNOSIS — L818 Other specified disorders of pigmentation: Secondary | ICD-10-CM | POA: Diagnosis not present

## 2014-06-27 DIAGNOSIS — D1801 Hemangioma of skin and subcutaneous tissue: Secondary | ICD-10-CM | POA: Diagnosis not present

## 2014-06-27 DIAGNOSIS — Z85828 Personal history of other malignant neoplasm of skin: Secondary | ICD-10-CM | POA: Diagnosis not present

## 2014-06-27 DIAGNOSIS — L821 Other seborrheic keratosis: Secondary | ICD-10-CM | POA: Diagnosis not present

## 2014-06-27 DIAGNOSIS — L570 Actinic keratosis: Secondary | ICD-10-CM | POA: Diagnosis not present

## 2014-07-05 ENCOUNTER — Other Ambulatory Visit: Payer: Self-pay

## 2014-07-05 ENCOUNTER — Telehealth: Payer: Self-pay

## 2014-07-05 NOTE — Telephone Encounter (Addendum)
Pharmacy called to clarify dose of Climara patch. Advised  per Dr. Sena Hitch note " Continues on one half of Climara 0.075 mg patch weekly".

## 2014-07-20 ENCOUNTER — Other Ambulatory Visit: Payer: Self-pay

## 2014-07-20 DIAGNOSIS — Z1231 Encounter for screening mammogram for malignant neoplasm of breast: Secondary | ICD-10-CM

## 2014-08-01 ENCOUNTER — Telehealth: Payer: Self-pay | Admitting: Family Medicine

## 2014-08-01 NOTE — Telephone Encounter (Signed)
Noted  

## 2014-08-01 NOTE — Telephone Encounter (Signed)
FYI Pt is calling back to let md know her next eye exam is not due until 02-01-15. Pt eye doctor is dr Wille Glaser scott

## 2014-08-15 ENCOUNTER — Ambulatory Visit (INDEPENDENT_AMBULATORY_CARE_PROVIDER_SITE_OTHER): Payer: Medicare Other | Admitting: Internal Medicine

## 2014-08-15 ENCOUNTER — Ambulatory Visit (INDEPENDENT_AMBULATORY_CARE_PROVIDER_SITE_OTHER)
Admission: RE | Admit: 2014-08-15 | Discharge: 2014-08-15 | Disposition: A | Discharge status: Home / Self Care | Payer: Medicare Other | Source: Ambulatory Visit | Attending: Internal Medicine | Admitting: Internal Medicine

## 2014-08-15 ENCOUNTER — Encounter: Payer: Self-pay | Admitting: Internal Medicine

## 2014-08-15 VITALS — BP 124/72 | HR 66 | Ht 60.0 in | Wt 120.2 lb

## 2014-08-15 DIAGNOSIS — J439 Emphysema, unspecified: Principal | ICD-10-CM

## 2014-08-15 DIAGNOSIS — J449 Chronic obstructive pulmonary disease, unspecified: Secondary | ICD-10-CM

## 2014-08-15 DIAGNOSIS — J930 Spontaneous tension pneumothorax: Secondary | ICD-10-CM | POA: Diagnosis not present

## 2014-08-15 DIAGNOSIS — J984 Other disorders of lung: Secondary | ICD-10-CM

## 2014-08-15 NOTE — Assessment & Plan Note (Signed)
There has been no recurrence. We have discussed potential for blebs to rupture. She does not engage in high risk activities with ambient pressure change such as skydiving or scuba diving.

## 2014-08-15 NOTE — Progress Notes (Signed)
Patient ID: [name], female    DOB: 06-04-1940, 74 y.o.   MRN: [id number]  HPI 61 yoF never smoker, followed for a diffuse fibrocystic lung condition, out of age range for LAM, but not identified, Hx of bullectomy and lung bx after spontaneous PTX 2008. [name] Complicated by hx acute bronchitis, GERD. Last here July 30, 2009- notes reviewed. She denies any significant events since then. No colds. Occasional mild rhinorhea or congestion blamed on pollen season and managed with Zyrtec. Rarely residual "sticking pains" and she denies dyspnea or any awareness of lung problems. Post thoracotomy numbness is gone.  We went back, reviewed and discussed her path report from 2009.  Told at [facility] this may be congential. Now diabetic- diet controlled. CXR- 08/01/11 IMPRESSION:  Stable examination. No acute cardiopulmonary process.  Original Report Authenticated By: Vivia Ewing, M.D.   08/05/11-  46 yoF never smoker, followed for a diffuse fibrocystic lung condition, out of age range for LAM, but not identified, Hx of bullectomy and lung bx after spontaneous PTX 2008. [name] Complicated by hx acute bronchitis, GERD    PCP Dr Elease Hashimoto  Patient states "breathing is fine". She does not exert to the point of dyspnea.. Denies cough, phlegm, wheeze, chest pain or palpitation.  08/10/12- 71 yoF never smoker, followed for a diffuse fibrocystic lung condition, out of age range for LAM, but not identified, Hx of bullectomy and lung bx after spontaneous PTX 2008. [name] Complicated by hx acute bronchitis, GERD    PCP Dr Elease Hashimoto     Sister here FOLLOWS FOR: patient states she is having labored breathing, other concerns going on besides her lungs. Pt would like to know if she is due for another PNA shot-had one about 5 years ago. She went to emergency room feeling tired and short of breath. CT was negative for acute process. Pressure/numbness/tightness feeling through left chest and left arm. Pending cardiology appointment  tomorrow. Transient numbness left lip. Sister was able to confirm there is no family history of lung disease. Patient remains asymptomatic of her cystic lung disease with no cough, wheeze or sputum. CT chest 07/25/12- images reviewed with her and her sister IMPRESSION:  1. 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 This raises the possibility of  underlying cystic conditions such as lymphangiomyomatosis.  2. Prominence of the ascending thoracic aorta. Recommend  correlation with potential underlying aortic valvular disease.  3. No evidence of pulmonary embolism or pneumothorax.  Original Report Authenticated By: Aletta Edouard, M.D.   08/10/13- 39 yoF never smoker, followed for a diffuse fibrocystic lung condition, out of age range for LAM, but not identified, Hx of bullectomy and lung bx after spontaneous PTX 2008. [name] Complicated by hx acute bronchitis, GERD    PCP Dr Elease Hashimoto     FOLLOWS FOR:  Breathing doing well.  Pt reports getting over a cold and still having some sinus pressure and PND Otherwise has done well with no significant respiratory events. She does not recognize routine cough or dyspnea on exertion.  08/15/14- 71 yoF never smoker, followed for a diffuse fibrocystic lung condition, out of age range for LAM, but not identified, Hx of bullectomy and lung bx after spontaneous PTX 2008. [name] Complicated by hx acute bronchitis, GERD    PCP Dr Elease Hashimoto  FOLLOWS FOR: Pt denies any trouble with her breathing at this time. We reviewed the images from her chest CT in 2014. We discussed the previous  left pneumothorax associated with a rupture of one her for blebs. These are thin-walled cysts and may be congenital. She does not know of anybody in the family with anything similar or with history of pneumothorax.   Office spirometry 08/15/2014-within normal limits-FVC 2.7 to/117%, FEV1 2.09/118%, FEV1/FVC 0.77, FEF 25-75 percent 1.81   ROS-see  HPI Constitutional:   No-   weight loss, night sweats, fevers, chills, fatigue, lassitude. HEENT:   No-  headaches, difficulty swallowing, tooth/dental problems, sore throat,       No-  sneezing, itching, ear ache, nasal congestion, post nasal drip,  CV:  chest pain, no-orthopnea, PND, swelling in lower extremities, anasarca,  dizziness, palpitations Resp: No-  acute shortness of breath with exertion or at rest.              No-   productive cough,  No non-productive cough,  No- coughing up of blood.              No-   change in color of mucus.  No- wheezing.   Skin: No-   rash or lesions. GI:  No-   heartburn, indigestion, abdominal pain, nausea, vomiting,  GU:  MS:  No-   joint pain or swelling.   Neuro-     nothing unusual Psych:  No- change in mood or affect. No depression or anxiety.  No memory loss.  OBJ- Physical Exam General- Alert, Oriented, Affect-appropriate, Distress- none acute. Looks well Skin- rash-none, lesions- none, excoriation- none Lymphadenopathy- none Head- atraumatic            Eyes- Gross vision intact, PERRLA, conjunctivae and secretions clear            Ears- Hearing, canals-normal            Nose- Clear, no-Septal dev, mucus, polyps, erosion, perforation             Throat- Mallampati II , mucosa clear , drainage- none, tonsils- atrophic Neck- flexible , trachea midline, no stridor , thyroid nl, carotid no bruit Chest - symmetrical excursion , unlabored           Heart/CV- RRR , no murmur heard , no gallop  , no rub, nl s1 s2                           - JVD- minimal , edema- none, stasis changes- none, varices- none           Lung- clear to P&A with unlabored breathing, wheeze- none, cough- none ,   dullness-none, rub- none           Chest wall-  Abd-  Br/ Gen/ Rectal- Not done, not indicated Extrem- cyanosis- none, clubbing, none, atrophy- none, strength- nl Neuro- grossly intact to observation

## 2014-08-15 NOTE — Assessment & Plan Note (Addendum)
CT chest 2014 looks stable and was reviewed again with her. She has absolutely no respiratory symptoms. 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 Plan-chest x-ray. Follow at long intervals. Consider repeat CT at some point in the future to follow emphysematous blebs.

## 2014-08-15 NOTE — Patient Instructions (Signed)
Order- CXR  Dx mixed restrictive and obstructive lung disease, fibrocystic lung disease  Order- office spirometry

## 2014-08-23 DIAGNOSIS — L570 Actinic keratosis: Secondary | ICD-10-CM | POA: Diagnosis not present

## 2014-08-23 DIAGNOSIS — D485 Neoplasm of uncertain behavior of skin: Secondary | ICD-10-CM | POA: Diagnosis not present

## 2014-08-23 DIAGNOSIS — L821 Other seborrheic keratosis: Secondary | ICD-10-CM | POA: Diagnosis not present

## 2014-08-28 ENCOUNTER — Ambulatory Visit
Admission: RE | Admit: 2014-08-28 | Discharge: 2014-08-28 | Disposition: A | Discharge status: Home / Self Care | Payer: Medicare Other | Source: Ambulatory Visit

## 2014-08-28 DIAGNOSIS — Z1231 Encounter for screening mammogram for malignant neoplasm of breast: Secondary | ICD-10-CM | POA: Diagnosis not present

## 2014-09-12 DIAGNOSIS — D485 Neoplasm of uncertain behavior of skin: Secondary | ICD-10-CM | POA: Diagnosis not present

## 2014-09-13 DIAGNOSIS — D0439 Carcinoma in situ of skin of other parts of face: Secondary | ICD-10-CM | POA: Diagnosis not present

## 2014-09-13 DIAGNOSIS — L570 Actinic keratosis: Secondary | ICD-10-CM | POA: Diagnosis not present

## 2014-09-26 DIAGNOSIS — C44311 Basal cell carcinoma of skin of nose: Secondary | ICD-10-CM | POA: Diagnosis not present

## 2014-10-11 ENCOUNTER — Other Ambulatory Visit (INDEPENDENT_AMBULATORY_CARE_PROVIDER_SITE_OTHER): Payer: Medicare Other

## 2014-10-11 DIAGNOSIS — E119 Type 2 diabetes mellitus without complications: Secondary | ICD-10-CM

## 2014-10-11 LAB — HEMOGLOBIN A1C: HEMOGLOBIN A1C: 5.8 % (ref 4.6–6.5)

## 2014-10-12 ENCOUNTER — Telehealth: Payer: Self-pay | Admitting: Family Medicine

## 2014-10-12 ENCOUNTER — Other Ambulatory Visit: Payer: Self-pay | Admitting: Family Medicine

## 2014-10-12 DIAGNOSIS — E119 Type 2 diabetes mellitus without complications: Secondary | ICD-10-CM

## 2014-10-12 DIAGNOSIS — E785 Hyperlipidemia, unspecified: Secondary | ICD-10-CM

## 2014-10-12 NOTE — Telephone Encounter (Addendum)
Pt would like to sch a1c and chole blood work in [place] 2017. Can I sch? Pt also would like a copy of last a1c mail to home address

## 2014-10-12 NOTE — Telephone Encounter (Signed)
Labs are ordered. Lab is mailed.

## 2014-10-12 NOTE — Telephone Encounter (Signed)
OK to schedule

## 2014-10-12 NOTE — Telephone Encounter (Signed)
Pt has been sch

## 2014-10-17 DIAGNOSIS — D0439 Carcinoma in situ of skin of other parts of face: Secondary | ICD-10-CM | POA: Diagnosis not present

## 2014-11-14 DIAGNOSIS — L821 Other seborrheic keratosis: Secondary | ICD-10-CM | POA: Diagnosis not present

## 2014-12-05 ENCOUNTER — Telehealth: Payer: Self-pay | Admitting: Family Medicine

## 2014-12-05 NOTE — Telephone Encounter (Signed)
Pt states she has had to change compnies for her testing supplies.' Pt needs all supplies, strips, lancets, needles ect to [facility]  ([facility] [place] , [place]  They have sent fax (around 8/15) and we have not responded per pt. Pt test one time day

## 2014-12-05 NOTE — Telephone Encounter (Signed)
Can you please call her and ask her to have the company fax that form over again. Please

## 2014-12-07 ENCOUNTER — Ambulatory Visit (INDEPENDENT_AMBULATORY_CARE_PROVIDER_SITE_OTHER): Payer: Medicare Other

## 2014-12-07 DIAGNOSIS — Z23 Encounter for immunization: Secondary | ICD-10-CM | POA: Diagnosis not present

## 2014-12-11 NOTE — Telephone Encounter (Signed)
For has been faxed and sent back to

## 2015-02-01 DIAGNOSIS — H26491 Other secondary cataract, right eye: Secondary | ICD-10-CM | POA: Diagnosis not present

## 2015-03-05 ENCOUNTER — Ambulatory Visit (INDEPENDENT_AMBULATORY_CARE_PROVIDER_SITE_OTHER): Payer: Medicare Other | Admitting: Family Medicine

## 2015-03-05 ENCOUNTER — Encounter: Payer: Self-pay | Admitting: Family Medicine

## 2015-03-05 ENCOUNTER — Ambulatory Visit (INDEPENDENT_AMBULATORY_CARE_PROVIDER_SITE_OTHER)
Admission: RE | Admit: 2015-03-05 | Discharge: 2015-03-05 | Disposition: A | Discharge status: Home / Self Care | Payer: Medicare Other | Source: Ambulatory Visit | Attending: Family Medicine | Admitting: Family Medicine

## 2015-03-05 VITALS — BP 120/72 | HR 79 | Temp 98.1°F | Resp 16 | Ht 60.0 in | Wt 121.7 lb

## 2015-03-05 DIAGNOSIS — M79651 Pain in right thigh: Secondary | ICD-10-CM | POA: Diagnosis not present

## 2015-03-05 DIAGNOSIS — M25551 Pain in right hip: Secondary | ICD-10-CM

## 2015-03-05 DIAGNOSIS — M810 Age-related osteoporosis without current pathological fracture: Secondary | ICD-10-CM

## 2015-03-05 DIAGNOSIS — M1611 Unilateral primary osteoarthritis, right hip: Secondary | ICD-10-CM | POA: Diagnosis not present

## 2015-03-05 NOTE — Progress Notes (Signed)
Subjective:    Patient ID: [name], female    DOB: 1940/08/01, 74 y.o.   MRN: [id number]  HPI Patient seen with 2 to three-week history of poorly localized right hip and lower extremity pain. Denies any associated low back pain. Dull and achy quality of pain which is relatively mild-5 out of 10 severity at its worst No known injury.  Location is right lateral and posterior hip and radiates somewhat posterior but occasionally anterior as well. No weakness. No numbness. No urine or stool incontinence. Has not taken any medications.  No associated fever, chills, appetite or weight changes. Worse with walking. No associated skin rash. No dermatome distribution. Patient does have history of osteoporosis and maintained on Fosamax per her GYN. She had concerns because of use of Fosamax and other site fractures-though again she is ambulating fairly well  Past Medical History  Diagnosis Date  . Diabetes mellitus     Borderline/no meds  . GERD (gastroesophageal reflux disease)   . Hypertension   . Hyperlipidemia   . Migraines     during menopause  . Kidney stones     1 time  . Cancer ([place]) 2001    skin, nose  . Pneumothorax on left   . DNR (do not resuscitate) 2009  . Diverticulosis of colon   . Other diseases of lung, not elsewhere classified   . Acute bronchitis   . Abnormal chest x-ray   . Osteoporosis 04/2014    T score -2.9  . Skin tag of anus   . Cataract   . Post-operative nausea and vomiting     1 time   Past Surgical History  Procedure Laterality Date  . Lung surgery  2008    left lung VATS for biopsy and removal of blebs with pleurodesis  . Bunionectomy  2004    left  . Carpal tunnel release  2000    right   . Abdominal hysterectomy  1994    fibroids/ complete  . Tonsillectomy      as child  . Oophorectomy      BSO  . Cataract extraction      Bil    reports that she has never smoked. She has never used smokeless tobacco. She reports that she does not  drink alcohol or use illicit drugs. family history includes Cancer in her father, mother, and sister; Diabetes in her sister; Emphysema in her maternal aunt; Hyperlipidemia in her sister; Ovarian cancer in her sister; Pulmonary embolism in her father. No Known Allergies    Review of Systems  Constitutional: Negative for fever, chills, appetite change and unexpected weight change.  Respiratory: Negative for shortness of breath.   Cardiovascular: Negative for chest pain.  Gastrointestinal: Negative for abdominal pain.  Genitourinary: Negative for dysuria.  Musculoskeletal: Negative for back pain, joint swelling and gait problem.  Skin: Negative for rash.  Neurological: Negative for weakness and numbness.  Hematological: Negative for adenopathy.       Objective:   Physical Exam  Constitutional: She appears well-developed and well-nourished.  Cardiovascular: Normal rate and regular rhythm.   Pulmonary/Chest: Effort normal and breath sounds normal. No respiratory distress. She has no wheezes. She has no rales.  Musculoskeletal: She exhibits no edema.  Excellent range of motion right hip. No tenderness right trochanteric bursa. Straight leg raise negative. No leg edema.  Neurological:  Symmetric reflexes knee and ankle bilaterally. Full-strength lower extremity. Normal sensory function.  Assessment & Plan:  Very poorly localized right hip and lower extremity pain. She does not describe any claudication symptoms and no risk for peripheral vascular disease. Does not follow dermatome distribution. No evidence for sciatica. No evidence for trochanteric bursitis. Doubt severe osteoarthritis right hip Obtain x-rays right hip and right femur. Consider orthopedic referral if symptoms persist.

## 2015-03-05 NOTE — Progress Notes (Signed)
Pre visit review using our clinic review tool, if applicable. No additional management support is needed unless otherwise documented below in the visit note. 

## 2015-04-03 ENCOUNTER — Encounter: Payer: Self-pay | Admitting: *Deleted

## 2015-04-17 ENCOUNTER — Other Ambulatory Visit (INDEPENDENT_AMBULATORY_CARE_PROVIDER_SITE_OTHER): Payer: Medicare Other

## 2015-04-17 ENCOUNTER — Ambulatory Visit (INDEPENDENT_AMBULATORY_CARE_PROVIDER_SITE_OTHER): Payer: Medicare Other | Admitting: Family Medicine

## 2015-04-17 VITALS — BP 110/80 | HR 90 | Temp 97.9°F | Ht 60.0 in | Wt 119.9 lb

## 2015-04-17 DIAGNOSIS — R05 Cough: Secondary | ICD-10-CM | POA: Diagnosis not present

## 2015-04-17 DIAGNOSIS — E119 Type 2 diabetes mellitus without complications: Secondary | ICD-10-CM

## 2015-04-17 DIAGNOSIS — E785 Hyperlipidemia, unspecified: Secondary | ICD-10-CM

## 2015-04-17 DIAGNOSIS — R059 Cough, unspecified: Secondary | ICD-10-CM

## 2015-04-17 LAB — HEMOGLOBIN A1C: HEMOGLOBIN A1C: 6.1 % (ref 4.6–6.5)

## 2015-04-17 LAB — LIPID PANEL
CHOL/HDL RATIO: 3
CHOLESTEROL: 144 mg/dL (ref 0–200)
HDL: 54.7 mg/dL (ref >39.00)
LDL CALC: 65 mg/dL (ref 0–99)
NONHDL: 89.68
Triglycerides: 123 mg/dL (ref 0.0–149.0)
VLDL: 24.6 mg/dL (ref 0.0–40.0)

## 2015-04-17 NOTE — Patient Instructions (Signed)
Touch base in one to two weeks if cough not resolving and sooner for any fever or increased shortness of breath. Consider Dayquil or non-sedating anti-histamine such as Allegra or Zyrtec.

## 2015-04-17 NOTE — Progress Notes (Signed)
Pre visit review using our clinic review tool, if applicable. No additional management support is needed unless otherwise documented below in the visit note. 

## 2015-04-17 NOTE — Progress Notes (Signed)
   Subjective:    Patient ID: [name], female    DOB: Jun 03, 1940, 75 y.o.   MRN: [id number]  HPI Patient seen for acute visit. Three-week history of cough. Started with typical cold-like symptoms. Cough is mostly nonproductive. Occasionally breath of clear sputum. She's had some clear postnasal drainage. Took DayQuil which helped. No fever. No dyspnea. No wheezing. Denies any recent appetite or weight changes.  Past Medical History  Diagnosis Date  . Diabetes mellitus     Borderline/no meds  . GERD (gastroesophageal reflux disease)   . Hypertension   . Hyperlipidemia   . Migraines     during menopause  . Kidney stones     1 time  . Cancer ([place]) 2001    skin, nose  . Pneumothorax on left   . DNR (do not resuscitate) 2009  . Diverticulosis of colon   . Other diseases of lung, not elsewhere classified   . Acute bronchitis   . Abnormal chest x-ray   . Osteoporosis 04/2014    T score -2.9  . Skin tag of anus   . Cataract   . Post-operative nausea and vomiting     1 time   Past Surgical History  Procedure Laterality Date  . Lung surgery  2008    left lung VATS for biopsy and removal of blebs with pleurodesis  . Bunionectomy  2004    left  . Carpal tunnel release  2000    right   . Abdominal hysterectomy  1994    fibroids/ complete  . Tonsillectomy      as child  . Oophorectomy      BSO  . Cataract extraction      Bil    reports that she has never smoked. She has never used smokeless tobacco. She reports that she does not drink alcohol or use illicit drugs. family history includes Cancer in her father, mother, and sister; Diabetes in her sister; Emphysema in her maternal aunt; Hyperlipidemia in her sister; Ovarian cancer in her sister; Pulmonary embolism in her father. No Known Allergies    Review of Systems  Constitutional: Negative for fever and chills.  HENT: Positive for congestion and postnasal drip.   Respiratory: Positive for cough. Negative for shortness  of breath.   Cardiovascular: Negative for chest pain.       Objective:   Physical Exam  Constitutional: She appears well-developed and well-nourished.  HENT:  Right Ear: External ear normal.  Left Ear: External ear normal.  Mouth/Throat: Oropharynx is clear and moist.  Neck: Neck supple. No thyromegaly present.  Cardiovascular: Normal rate and regular rhythm.   Pulmonary/Chest: Effort normal and breath sounds normal. No respiratory distress. She has no wheezes. She has no rales.          Assessment & Plan:  Cough. Nonfocal exam. Doubt bacterial illness. She does not describe classic bacterial sinusitis symptoms. Try over-the-counter Allegra or Zyrtec or continue with DayQuil. Touch base in one week if cough not resolving. Follow-up sooner for any fever or new symptoms such as shortness of breath

## 2015-04-24 ENCOUNTER — Encounter: Payer: Self-pay | Admitting: Family Medicine

## 2015-04-24 ENCOUNTER — Ambulatory Visit (INDEPENDENT_AMBULATORY_CARE_PROVIDER_SITE_OTHER): Payer: Medicare Other | Admitting: Family Medicine

## 2015-04-24 VITALS — BP 110/80 | HR 73 | Temp 97.7°F | Ht 60.0 in | Wt 123.0 lb

## 2015-04-24 DIAGNOSIS — E785 Hyperlipidemia, unspecified: Secondary | ICD-10-CM

## 2015-04-24 DIAGNOSIS — E119 Type 2 diabetes mellitus without complications: Secondary | ICD-10-CM

## 2015-04-24 DIAGNOSIS — I10 Essential (primary) hypertension: Secondary | ICD-10-CM | POA: Diagnosis not present

## 2015-04-24 MED ORDER — LISINOPRIL 10 MG PO TABS: 10.0000 mg | ORAL_TABLET | Freq: Every day | ORAL | Status: DC

## 2015-04-24 MED ORDER — SIMVASTATIN 20 MG PO TABS: 20.0000 mg | ORAL_TABLET | Freq: Every day | ORAL | Status: DC

## 2015-04-24 NOTE — Progress Notes (Signed)
Subjective:    Patient ID: [name], female    DOB: Oct 05, 1940, 75 y.o.   MRN: [id number]  HPI  follow-up regarding hyperlipidemia and hypertension. She has had very good control for quite some time. She also has lifestyle controlled type 2 diabetes. Recent fasting blood sugars have been slightly high. No polyuria or polydipsia. Her weight is up just a few pounds from last year. She had recent respiratory illness and has not been walking as much. A1c 6.1%. Lipids remain well controlled. Requesting refills of medications. She does not have any history of CAD or peripheral vascular disease. Stays active with walking- usually about 40-60 minutes per day  Past Medical History  Diagnosis Date  . Diabetes mellitus     Borderline/no meds  . GERD (gastroesophageal reflux disease)   . Hypertension   . Hyperlipidemia   . Migraines     during menopause  . Kidney stones     1 time  . Cancer ([place]) 2001    skin, nose  . Pneumothorax on left   . DNR (do not resuscitate) 2009  . Diverticulosis of colon   . Other diseases of lung, not elsewhere classified   . Acute bronchitis   . Abnormal chest x-ray   . Osteoporosis 04/2014    T score -2.9  . Skin tag of anus   . Cataract   . Post-operative nausea and vomiting     1 time   Past Surgical History  Procedure Laterality Date  . Lung surgery  2008    left lung VATS for biopsy and removal of blebs with pleurodesis  . Bunionectomy  2004    left  . Carpal tunnel release  2000    right   . Abdominal hysterectomy  1994    fibroids/ complete  . Tonsillectomy      as child  . Oophorectomy      BSO  . Cataract extraction      Bil    reports that she has never smoked. She has never used smokeless tobacco. She reports that she does not drink alcohol or use illicit drugs. family history includes Cancer in her father, mother, and sister; Diabetes in her sister; Emphysema in her maternal aunt; Hyperlipidemia in her sister; Ovarian cancer in her  sister; Pulmonary embolism in her father. No Known Allergies    Review of Systems  Constitutional: Negative for fever, activity change, appetite change, fatigue and unexpected weight change.  HENT: Negative for ear pain, hearing loss, sore throat and trouble swallowing.   Eyes: Negative for visual disturbance.  Respiratory: Negative for cough and shortness of breath.   Cardiovascular: Negative for chest pain and palpitations.  Gastrointestinal: Negative for abdominal pain, diarrhea, constipation and blood in stool.  Genitourinary: Negative for dysuria and hematuria.  Musculoskeletal: Negative for myalgias, back pain and arthralgias.  Skin: Negative for rash.  Neurological: Negative for dizziness, syncope and headaches.  Hematological: Negative for adenopathy.  Psychiatric/Behavioral: Negative for confusion and dysphoric mood.       Objective:   Physical Exam  Constitutional: She is oriented to person, place, and time. She appears well-developed and well-nourished.  Neck: Neck supple. No thyromegaly present.  Cardiovascular: Normal rate and regular rhythm.   Pulmonary/Chest: Effort normal and breath sounds normal. No respiratory distress. She has no wheezes. She has no rales.  Musculoskeletal: She exhibits no edema.  Neurological: She is alert and oriented to person, place, and time.  Psychiatric: She has a normal mood  and affect. Her behavior is normal.          Assessment & Plan:   #1 hypertension. Stable and at goal. Refill lisinopril for one year    #2 hyperlipidemia. Recheck lipid panel last week and results reviewed with patient. Lipids are well controlled. Refill simvastatin for 1 year   #3 type 2 diabetes. Diet controlled. A1c 6.1%. Continue lifestyle management. Recheck A1c at follow-up in 6 months

## 2015-05-29 ENCOUNTER — Ambulatory Visit (INDEPENDENT_AMBULATORY_CARE_PROVIDER_SITE_OTHER): Payer: Medicare Other | Admitting: Gynecology

## 2015-05-29 ENCOUNTER — Encounter: Payer: Self-pay | Admitting: Gynecology

## 2015-05-29 VITALS — BP 124/80 | Ht 60.0 in | Wt 122.0 lb

## 2015-05-29 DIAGNOSIS — M810 Age-related osteoporosis without current pathological fracture: Secondary | ICD-10-CM | POA: Diagnosis not present

## 2015-05-29 DIAGNOSIS — Z79890 Hormone replacement therapy (postmenopausal): Secondary | ICD-10-CM

## 2015-05-29 DIAGNOSIS — N952 Postmenopausal atrophic vaginitis: Secondary | ICD-10-CM

## 2015-05-29 DIAGNOSIS — Z01419 Encounter for gynecological examination (general) (routine) without abnormal findings: Secondary | ICD-10-CM

## 2015-05-29 MED ORDER — ESTRADIOL 0.075 MG/24HR TD PTWK: 0.0750 mg | MEDICATED_PATCH | TRANSDERMAL | Status: DC

## 2015-05-29 MED ORDER — ALENDRONATE SODIUM 70 MG PO TABS: 70.0000 mg | ORAL_TABLET | ORAL | Status: DC

## 2015-05-29 NOTE — Progress Notes (Signed)
[name] 09/06/1940 [id number]        75 y.o.  G0P0  for breast and pelvic exam. Several issues noted below.  Past medical history,surgical history, problem list, medications, allergies, family history and social history were all reviewed and documented as reviewed in the EPIC chart.  ROS:  Performed with pertinent positives and negatives included in the history, assessment and plan.   Additional significant findings :  none   Exam: [name] assistant Filed Vitals:   05/29/15 1359  BP: 124/80  Height: 5' (1.524 m)  Weight: 122 lb (55.339 kg)   General appearance:  Normal affect, orientation and appearance. Skin: Grossly normal HEENT: Without gross lesions.  No cervical or supraclavicular adenopathy. Thyroid normal.  Lungs:  Clear without wheezing, rales or rhonchi Cardiac: RR, without RMG Abdominal:  Soft, nontender, without masses, guarding, rebound, organomegaly or hernia Breasts:  Examined lying and sitting without masses, retractions, discharge or axillary adenopathy. Pelvic:  Ext/BUS/vagina with atrophic changes  Adnexa without masses or tenderness    Anus and perineum normal   Rectovaginal normal sphincter tone without palpated masses or tenderness.    Assessment/Plan:  75 y.o. G0P0 female for breast and pelvic exam.   1. Postmenopausal/atrophic genital changes. Status post TAH/BSO for leiomyoma. On Climara 0.75 one half a patch weekly. Has tried stopping but had unacceptable hot flushes. I again reviewed the risks of HRT include stroke heart attack DVT and breast cancer. Patient at this point strongly wants to continue I refilled her 1 year. 2. Osteoporosis.  DEXA 04/2014 T score -2.9 improved from prior DEXA. On Fosamax now for 2 years. We'll continue on Fosamax for now she is doing well. Repeat DEXA next year at two-year interval. 3. Pap smear 2012. No Pap smear done today. No history of abnormal Pap smears. We both agreed to stop screening per current screening  guidelines based on age and hysterectomy. 4. Colonoscopy 2015. Repeat at their recommended interval. 5. Mammography 08/2014. Continue with annual mammography when due. SBE monthly reviewed. 6. Health maintenance. No routine blood work done as this is done at her primary physician's office. Follow up 1 year, sooner as needed.   Anastasio Auerbach MD, 2:20 PM 05/29/2015

## 2015-05-29 NOTE — Patient Instructions (Signed)

## 2015-07-04 ENCOUNTER — Telehealth: Payer: Self-pay | Admitting: *Deleted

## 2015-07-04 NOTE — Telephone Encounter (Signed)
Prior Authorization for Estradiol (climara patch ) 0.0375 mg approved 07/03/15 until 04/05/16.pharmacy informed as well.

## 2015-07-23 ENCOUNTER — Other Ambulatory Visit: Payer: Self-pay

## 2015-07-23 DIAGNOSIS — Z1231 Encounter for screening mammogram for malignant neoplasm of breast: Secondary | ICD-10-CM

## 2015-07-25 DIAGNOSIS — Z85828 Personal history of other malignant neoplasm of skin: Secondary | ICD-10-CM | POA: Diagnosis not present

## 2015-07-25 DIAGNOSIS — D1801 Hemangioma of skin and subcutaneous tissue: Secondary | ICD-10-CM | POA: Diagnosis not present

## 2015-07-25 DIAGNOSIS — D225 Melanocytic nevi of trunk: Secondary | ICD-10-CM | POA: Diagnosis not present

## 2015-07-25 DIAGNOSIS — L821 Other seborrheic keratosis: Secondary | ICD-10-CM | POA: Diagnosis not present

## 2015-08-16 ENCOUNTER — Encounter: Payer: Self-pay | Admitting: Internal Medicine

## 2015-08-16 ENCOUNTER — Ambulatory Visit (INDEPENDENT_AMBULATORY_CARE_PROVIDER_SITE_OTHER): Payer: Medicare Other | Admitting: Internal Medicine

## 2015-08-16 VITALS — BP 130/62 | HR 70 | Ht 60.0 in | Wt 126.0 lb

## 2015-08-16 DIAGNOSIS — J449 Chronic obstructive pulmonary disease, unspecified: Secondary | ICD-10-CM

## 2015-08-16 DIAGNOSIS — J984 Other disorders of lung: Secondary | ICD-10-CM | POA: Diagnosis not present

## 2015-08-16 DIAGNOSIS — J439 Emphysema, unspecified: Principal | ICD-10-CM

## 2015-08-16 NOTE — Patient Instructions (Signed)
I'm really glad you have been doing so well.  Please call if we can help

## 2015-08-16 NOTE — Assessment & Plan Note (Signed)
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 She has been clinically stable and comfortable with well-preserved pulmonary function. She is not requiring treatment. I have suggested that she be followed by her primary physician for routine medical care with referral back to pulmonary should she ever need in the future. She understands to avoid compression/decompression exposures such as skydiving or scuba diving. She had no interest in these sorts of activities anyway.

## 2015-08-16 NOTE — Progress Notes (Signed)
Patient ID: [name], female    DOB: 06-04-1940, 75 y.o.   MRN: [id number]  HPI 61 yoF never smoker, followed for a diffuse fibrocystic lung condition, out of age range for LAM, but not identified, Hx of bullectomy and lung bx after spontaneous PTX 2008. [name] Complicated by hx acute bronchitis, GERD. Last here July 30, 2009- notes reviewed. She denies any significant events since then. No colds. Occasional mild rhinorhea or congestion blamed on pollen season and managed with Zyrtec. Rarely residual "sticking pains" and she denies dyspnea or any awareness of lung problems. Post thoracotomy numbness is gone.  We went back, reviewed and discussed her path report from 2009.  Told at [facility] this may be congential. Now diabetic- diet controlled. CXR- 08/01/11 IMPRESSION:  Stable examination. No acute cardiopulmonary process.  Original Report Authenticated By: Vivia Ewing, M.D.   08/05/11-  46 yoF never smoker, followed for a diffuse fibrocystic lung condition, out of age range for LAM, but not identified, Hx of bullectomy and lung bx after spontaneous PTX 2008. [name] Complicated by hx acute bronchitis, GERD    PCP Dr Elease Hashimoto  Patient states "breathing is fine". She does not exert to the point of dyspnea.. Denies cough, phlegm, wheeze, chest pain or palpitation.  08/10/12- 71 yoF never smoker, followed for a diffuse fibrocystic lung condition, out of age range for LAM, but not identified, Hx of bullectomy and lung bx after spontaneous PTX 2008. [name] Complicated by hx acute bronchitis, GERD    PCP Dr Elease Hashimoto     Sister here FOLLOWS FOR: patient states she is having labored breathing, other concerns going on besides her lungs. Pt would like to know if she is due for another PNA shot-had one about 5 years ago. She went to emergency room feeling tired and short of breath. CT was negative for acute process. Pressure/numbness/tightness feeling through left chest and left arm. Pending cardiology appointment  tomorrow. Transient numbness left lip. Sister was able to confirm there is no family history of lung disease. Patient remains asymptomatic of her cystic lung disease with no cough, wheeze or sputum. CT chest 07/25/12- images reviewed with her and her sister IMPRESSION:  1. 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 This raises the possibility of  underlying cystic conditions such as lymphangiomyomatosis.  2. Prominence of the ascending thoracic aorta. Recommend  correlation with potential underlying aortic valvular disease.  3. No evidence of pulmonary embolism or pneumothorax.  Original Report Authenticated By: Aletta Edouard, M.D.   08/10/13- 39 yoF never smoker, followed for a diffuse fibrocystic lung condition, out of age range for LAM, but not identified, Hx of bullectomy and lung bx after spontaneous PTX 2008. [name] Complicated by hx acute bronchitis, GERD    PCP Dr Elease Hashimoto     FOLLOWS FOR:  Breathing doing well.  Pt reports getting over a cold and still having some sinus pressure and PND Otherwise has done well with no significant respiratory events. She does not recognize routine cough or dyspnea on exertion.  08/15/14- 71 yoF never smoker, followed for a diffuse fibrocystic lung condition, out of age range for LAM, but not identified, Hx of bullectomy and lung bx after spontaneous PTX 2008. [name] Complicated by hx acute bronchitis, GERD    PCP Dr Elease Hashimoto  FOLLOWS FOR: Pt denies any trouble with her breathing at this time. We reviewed the images from her chest CT in 2014. We discussed the previous  left pneumothorax associated with a rupture of one her for blebs. These are thin-walled cysts and may be congenital. She does not know of anybody in the family with anything similar or with history of pneumothorax.   Office spirometry 08/15/2014-within normal limits-FVC 2.7 to/117%, FEV1 2.09/118%, FEV1/FVC 0.77, FEF 25-75 percent 1.81    08/16/2015-75 year old female never smoker followed for a diffuse fibrocystic lung condition, out of age range for LAM, but not identified, history of bullectomy and lung biopsy after spontaneous PTX 2008. Complicated by history acute bronchitis, GERD FOLLOW FOR:  breathing doing well.  no concerns. She denies respiratory symptoms of any sort with no significant flares related to any viral bronchitis episodes this winter. She recognizes a little dyspnea with exertion appropriate for her age and level of fitness. CXR 05/11//2016- IMPRESSION: Stable postop scarring and left upper lobe and lingula. No active lung disease. Electronically Signed  By: Earle Gell M.D.  On: 08/15/2014 14:47  ROS-see HPI Constitutional:   No-   weight loss, night sweats, fevers, chills, fatigue, lassitude. HEENT:   No-  headaches, difficulty swallowing, tooth/dental problems, sore throat,       No-  sneezing, itching, ear ache, nasal congestion, post nasal drip,  CV:  chest pain, no-orthopnea, PND, swelling in lower extremities, anasarca,  dizziness, palpitations Resp: No-  acute shortness of breath with exertion or at rest.              No-   productive cough,  No non-productive cough,  No- coughing up of blood.              No-   change in color of mucus.  No- wheezing.   Skin: No-   rash or lesions. GI:  No-   heartburn, indigestion, abdominal pain, nausea, vomiting,  GU:  MS:  No-   joint pain or swelling.   Neuro-     nothing unusual Psych:  No- change in mood or affect. No depression or anxiety.  No memory loss.  OBJ- Physical Exam General- Alert, Oriented, Affect-appropriate, Distress- none acute. Looks well Skin- rash-none, lesions- none, excoriation- none Lymphadenopathy- none Head- atraumatic            Eyes- Gross vision intact, PERRLA, conjunctivae and secretions clear            Ears- Hearing, canals-normal            Nose- Clear, no-Septal dev, mucus, polyps, erosion, perforation              Throat- Mallampati II , mucosa clear , drainage- none, tonsils- atrophic Neck- flexible , trachea midline, no stridor , thyroid nl, carotid no bruit Chest - symmetrical excursion , unlabored           Heart/CV- RRR , no murmur heard , no gallop  , no rub, nl s1 s2                           - JVD- minimal , edema- none, stasis changes- none, varices- none           Lung- clear to P&A with unlabored breathing, wheeze- none, cough- none ,   dullness-none, rub- none           Chest wall-  Abd-  Br/ Gen/ Rectal- Not done, not indicated Extrem- cyanosis- none, clubbing, none, atrophy- none, strength- nl Neuro- grossly intact to observation

## 2015-08-30 ENCOUNTER — Ambulatory Visit
Admission: RE | Admit: 2015-08-30 | Discharge: 2015-08-30 | Disposition: A | Discharge status: Home / Self Care | Payer: Medicare Other | Source: Ambulatory Visit

## 2015-08-30 DIAGNOSIS — Z1231 Encounter for screening mammogram for malignant neoplasm of breast: Secondary | ICD-10-CM

## 2015-10-23 ENCOUNTER — Ambulatory Visit: Payer: Medicare Other | Admitting: Family Medicine

## 2015-10-23 ENCOUNTER — Ambulatory Visit (INDEPENDENT_AMBULATORY_CARE_PROVIDER_SITE_OTHER): Payer: Medicare Other | Admitting: Family Medicine

## 2015-10-23 VITALS — BP 110/80 | HR 78 | Temp 98.4°F | Ht 60.0 in | Wt 124.0 lb

## 2015-10-23 DIAGNOSIS — E119 Type 2 diabetes mellitus without complications: Secondary | ICD-10-CM

## 2015-10-23 DIAGNOSIS — Z0000 Encounter for general adult medical examination without abnormal findings: Secondary | ICD-10-CM

## 2015-10-23 LAB — POCT GLYCOSYLATED HEMOGLOBIN (HGB A1C): HEMOGLOBIN A1C: 6

## 2015-10-23 NOTE — Progress Notes (Signed)
Pre visit review using our clinic review tool, if applicable. No additional management support is needed unless otherwise documented below in the visit note. 

## 2015-10-23 NOTE — Progress Notes (Signed)
Subjective:     Patient ID: [name], female   DOB: 1940/07/23, 75 y.o.   MRN: [id number]  HPI Patient is here for Medicare annual wellness visit and medical follow-up No recent falls. No depression issues. Immunizations up-to-date with exception of tetanus. Colonoscopy up-to-date. She gets yearly mammograms. She's had previous total hysterectomy.  Type 2 diabetes which has been well controlled with diet and lifestyle. Last A1c about 6 months ago. She has hypertension treated with lisinopril and stable. She has hyperlipidemia on simvastatin. Lipids were checked last January and at goal.  Past Medical History  Diagnosis Date  . Diabetes mellitus     Borderline/no meds  . GERD (gastroesophageal reflux disease)   . Hypertension   . Hyperlipidemia   . Migraines     during menopause  . Kidney stones     1 time  . Cancer ([place]) 2001    skin, nose  . Pneumothorax on left   . DNR (do not resuscitate) 2009  . Diverticulosis of colon   . Other diseases of lung, not elsewhere classified   . Acute bronchitis   . Abnormal chest x-ray   . Osteoporosis 04/2014    T score -2.9  . Skin tag of anus   . Cataract   . Post-operative nausea and vomiting     1 time   Past Surgical History  Procedure Laterality Date  . Lung surgery  2008    left lung VATS for biopsy and removal of blebs with pleurodesis  . Bunionectomy  2004    left  . Carpal tunnel release  2000    right   . Abdominal hysterectomy  1994    fibroids/ complete  . Tonsillectomy      as child  . Oophorectomy      BSO  . Cataract extraction      Bil    reports that she has never smoked. She has never used smokeless tobacco. She reports that she does not drink alcohol or use illicit drugs. family history includes Cancer in her father, mother, and sister; Diabetes in her sister; Emphysema in her maternal aunt; Hyperlipidemia in her sister; Ovarian cancer in her sister; Pulmonary embolism in her father. No Known  Allergies     1.  Risk factors based on Past Medical , Social, and Family history reviewed and as indicated above with no changes 2.  Limitations in physical activities: None.  No recent falls.  Is very active with regular walking 3.  Depression/mood No active depression or anxiety issues 4.  Hearing No defiits 5.  ADLs independent in all. 6.  Cognitive function (orientation to time and place, language, writing, speech,memory) no short or long term memory issues.  Language and judgement intact.  7.  Home Safety no issues 8.  Height, weight, and visual acuity.all stable.  Mild weight gain recently. 9.  Counseling discussed regular calcium and vitamin D intake and regular weightbearing exercise. 10. Recommendation of preventive services. Continue yearly flu vaccination 11. Labs based on risk factors hemoglobin A1c 12. Care Plan as above. 13. Other Providers Dr Annamaria Boots [facility]. 14. Written schedule of screening/prevention services given to patient.   Review of Systems  Constitutional: Negative for fever, activity change, appetite change, fatigue and unexpected weight change.  HENT: Negative for ear pain, hearing loss, sore throat and trouble swallowing.   Eyes: Negative for visual disturbance.  Respiratory: Negative for cough and shortness of breath.   Cardiovascular: Negative for chest pain and palpitations.  Gastrointestinal: Negative for abdominal pain, diarrhea, constipation and blood in stool.  Genitourinary: Negative for dysuria and hematuria.  Musculoskeletal: Negative for myalgias, back pain and arthralgias.  Skin: Negative for rash.  Neurological: Negative for dizziness, syncope and headaches.  Hematological: Negative for adenopathy.  Psychiatric/Behavioral: Negative for confusion and dysphoric mood.       Objective:   Physical Exam  Constitutional: She is oriented to person, place, and time. She appears well-developed and well-nourished.  HENT:  Head: Normocephalic and  atraumatic.  Eyes: EOM are normal. Pupils are equal, round, and reactive to light.  Neck: Normal range of motion. Neck supple. No thyromegaly present.  Cardiovascular: Normal rate, regular rhythm and normal heart sounds.   No murmur heard. Pulmonary/Chest: Breath sounds normal. No respiratory distress. She has no wheezes. She has no rales.  Abdominal: Soft. Bowel sounds are normal. She exhibits no distension and no mass. There is no tenderness. There is no rebound and no guarding.  Musculoskeletal: Normal range of motion. She exhibits no edema.  Lymphadenopathy:    She has no cervical adenopathy.  Neurological: She is alert and oriented to person, place, and time. She displays normal reflexes. No cranial nerve deficit.  Skin: No rash noted.  Psychiatric: She has a normal mood and affect. Her behavior is normal. Judgment and thought content normal.       Assessment:     #1 Medicare subsequent annual wellness visit  #2 type 2 diabetes managed with lifestyle and well controlled.    Plan:     -Recheck hemoglobin A1c -Continue with regular weightbearing exercise. -Continue daily calcium and vitamin D -Continue current medication regimen -Continue with yearly mammogram.  A1C is 6.0%     Eulas Post MD [facility]

## 2015-10-23 NOTE — Patient Instructions (Signed)
-  Continue with yearly flu vaccine -Continue with yearly mammogram -Continue daily calcium and vitamin D supplementation -Left plan follow-up in 6 months to repeat lipids

## 2015-12-06 ENCOUNTER — Ambulatory Visit (INDEPENDENT_AMBULATORY_CARE_PROVIDER_SITE_OTHER): Payer: Medicare Other

## 2015-12-06 DIAGNOSIS — Z23 Encounter for immunization: Secondary | ICD-10-CM

## 2016-01-17 ENCOUNTER — Telehealth: Payer: Self-pay | Admitting: Family Medicine

## 2016-01-17 NOTE — Telephone Encounter (Signed)
Pt need new Rx for glucose blood test strips and Ultilet Classic Lancets MISC (testing once a day)   Pharm:  [facility]  [phone] (Fax)  Customer #:[id number]   Autumn pt would like to have a call back when it sent to [facility].

## 2016-01-21 NOTE — Telephone Encounter (Signed)
Pt states they have been requesting these supplies since 9/29. Pt would like you to send the order to them.  Pt states [facility] has sent fax over as well.

## 2016-01-21 NOTE — Telephone Encounter (Signed)
Pt is aware that form has been faxed.

## 2016-01-22 DIAGNOSIS — L814 Other melanin hyperpigmentation: Secondary | ICD-10-CM | POA: Diagnosis not present

## 2016-01-22 DIAGNOSIS — D1801 Hemangioma of skin and subcutaneous tissue: Secondary | ICD-10-CM | POA: Diagnosis not present

## 2016-01-22 DIAGNOSIS — L821 Other seborrheic keratosis: Secondary | ICD-10-CM | POA: Diagnosis not present

## 2016-01-22 DIAGNOSIS — C44519 Basal cell carcinoma of skin of other part of trunk: Secondary | ICD-10-CM | POA: Diagnosis not present

## 2016-01-22 DIAGNOSIS — Z85828 Personal history of other malignant neoplasm of skin: Secondary | ICD-10-CM | POA: Diagnosis not present

## 2016-01-22 DIAGNOSIS — L570 Actinic keratosis: Secondary | ICD-10-CM | POA: Diagnosis not present

## 2016-01-22 DIAGNOSIS — D485 Neoplasm of uncertain behavior of skin: Secondary | ICD-10-CM | POA: Diagnosis not present

## 2016-02-11 DIAGNOSIS — L709 Acne, unspecified: Secondary | ICD-10-CM | POA: Diagnosis not present

## 2016-03-18 DIAGNOSIS — D485 Neoplasm of uncertain behavior of skin: Secondary | ICD-10-CM | POA: Diagnosis not present

## 2016-03-18 DIAGNOSIS — C44519 Basal cell carcinoma of skin of other part of trunk: Secondary | ICD-10-CM | POA: Diagnosis not present

## 2016-03-24 DIAGNOSIS — E119 Type 2 diabetes mellitus without complications: Secondary | ICD-10-CM | POA: Diagnosis not present

## 2016-04-07 ENCOUNTER — Encounter: Payer: Self-pay | Admitting: Family Medicine

## 2016-04-24 ENCOUNTER — Ambulatory Visit: Payer: Medicare Other | Admitting: Family Medicine

## 2016-04-28 ENCOUNTER — Encounter: Payer: Self-pay | Admitting: Family Medicine

## 2016-04-28 ENCOUNTER — Ambulatory Visit (INDEPENDENT_AMBULATORY_CARE_PROVIDER_SITE_OTHER): Payer: Medicare Other | Admitting: Family Medicine

## 2016-04-28 VITALS — BP 122/84 | HR 64 | Ht 60.0 in | Wt 123.0 lb

## 2016-04-28 DIAGNOSIS — E785 Hyperlipidemia, unspecified: Secondary | ICD-10-CM

## 2016-04-28 DIAGNOSIS — I10 Essential (primary) hypertension: Secondary | ICD-10-CM

## 2016-04-28 DIAGNOSIS — Z23 Encounter for immunization: Secondary | ICD-10-CM | POA: Diagnosis not present

## 2016-04-28 DIAGNOSIS — E119 Type 2 diabetes mellitus without complications: Secondary | ICD-10-CM

## 2016-04-28 LAB — HEPATIC FUNCTION PANEL
ALBUMIN: 4.4 g/dL (ref 3.5–5.2)
ALT: 15 U/L (ref 0–35)
AST: 17 U/L (ref 0–37)
Alkaline Phosphatase: 35 U/L — ABNORMAL LOW (ref 39–117)
Bilirubin, Direct: 0.1 mg/dL (ref 0.0–0.3)
Total Bilirubin: 0.7 mg/dL (ref 0.2–1.2)
Total Protein: 6.4 g/dL (ref 6.0–8.3)

## 2016-04-28 LAB — LIPID PANEL
Cholesterol: 150 mg/dL (ref 0–200)
HDL: 58.5 mg/dL (ref >39.00)
LDL Cholesterol: 68 mg/dL (ref 0–99)
NONHDL: 91.87
Total CHOL/HDL Ratio: 3
Triglycerides: 117 mg/dL (ref 0.0–149.0)
VLDL: 23.4 mg/dL (ref 0.0–40.0)

## 2016-04-28 LAB — BASIC METABOLIC PANEL
BUN: 14 mg/dL (ref 6–23)
CHLORIDE: 105 meq/L (ref 96–112)
CO2: 31 mEq/L (ref 19–32)
Calcium: 9.3 mg/dL (ref 8.4–10.5)
Creatinine, Ser: 0.69 mg/dL (ref 0.40–1.20)
GFR: 88.03 mL/min (ref >60.00)
Glucose, Bld: 121 mg/dL — ABNORMAL HIGH (ref 70–99)
POTASSIUM: 4.5 meq/L (ref 3.5–5.1)
Sodium: 140 mEq/L (ref 135–145)

## 2016-04-28 LAB — HEMOGLOBIN A1C: HEMOGLOBIN A1C: 6.2 % (ref 4.6–6.5)

## 2016-04-28 MED ORDER — SIMVASTATIN 20 MG PO TABS: 20.0000 mg | ORAL_TABLET | Freq: Every day | ORAL | 3 refills | Status: DC

## 2016-04-28 MED ORDER — LISINOPRIL 10 MG PO TABS: 10.0000 mg | ORAL_TABLET | Freq: Every day | ORAL | 3 refills | Status: DC

## 2016-04-28 NOTE — Progress Notes (Signed)
Subjective:     Patient ID: [name], female   DOB: January 24, 1941, 76 y.o.   MRN: [id number]  HPI Patient seen for routine medical follow-up. Generally doing well. She denies any new complaints. No recent chest pains or dyspnea. No dizziness. She remains on lisinopril for hypertension and simvastatin for hyperlipidemia. She has history of type 2 diabetes which has been controlled with lifestyle. Stays fairly active. Her fasting blood sugars been slightly up but still low 100 range. A1c has been consistently well controlled. She has osteoporosis been treated by GYN and is on third year of Fosamax. She's takes low-dose estrogen still. She is on calcium and vitamin D.  Past Medical History:  Diagnosis Date  . Abnormal chest x-ray   . Acute bronchitis   . Cancer ([place]) 2001   skin, nose  . Cataract   . Diabetes mellitus    Borderline/no meds  . Diverticulosis of colon   . DNR (do not resuscitate) 2009  . GERD (gastroesophageal reflux disease)   . Hyperlipidemia   . Hypertension   . Kidney stones    1 time  . Migraines    during menopause  . Osteoporosis 04/2014   T score -2.9  . Other diseases of lung, not elsewhere classified   . Pneumothorax on left   . Post-operative nausea and vomiting    1 time  . Skin tag of anus    Past Surgical History:  Procedure Laterality Date  . ABDOMINAL HYSTERECTOMY  1994   fibroids/ complete  . BUNIONECTOMY  2004   left  . CARPAL TUNNEL RELEASE  2000   right   . CATARACT EXTRACTION     Bil  . LUNG SURGERY  2008   left lung VATS for biopsy and removal of blebs with pleurodesis  . OOPHORECTOMY     BSO  . TONSILLECTOMY     as child    reports that she has never smoked. She has never used smokeless tobacco. She reports that she does not drink alcohol or use drugs. family history includes Cancer in her father, mother, and sister; Diabetes in her sister; Emphysema in her maternal aunt; Hyperlipidemia in her sister; Ovarian cancer in her sister;  Pulmonary embolism in her father. No Known Allergies   Review of Systems  Constitutional: Negative for appetite change, fatigue and unexpected weight change.  Eyes: Negative for visual disturbance.  Respiratory: Negative for cough, chest tightness, shortness of breath and wheezing.   Cardiovascular: Negative for chest pain, palpitations and leg swelling.  Genitourinary: Negative for dysuria.  Neurological: Negative for dizziness, seizures, syncope, weakness, light-headedness and headaches.       Objective:   Physical Exam  Constitutional: She appears well-developed and well-nourished.  Eyes: Pupils are equal, round, and reactive to light.  Neck: Neck supple. No JVD present. No thyromegaly present.  Cardiovascular: Normal rate and regular rhythm.  Exam reveals no gallop.   Pulmonary/Chest: Effort normal and breath sounds normal. No respiratory distress. She has no wheezes. She has no rales.  Musculoskeletal: She exhibits no edema.  Neurological: She is alert.       Assessment:     #1 hypertension stable and at goal  #2 dyslipidemia  #3 type 2 diabetes which is well controlled    Plan:     -Pneumovax recommended -Continue with yearly flu vaccine -Refill lisinopril and simvastatin for one year -Check labs with lipid, hepatic, basic metabolic panel, hemoglobin A1c  Eulas Post MD [facility]  at [facility]

## 2016-04-28 NOTE — Progress Notes (Signed)
Pre visit review using our clinic review tool, if applicable. No additional management support is needed unless otherwise documented below in the visit note. 

## 2016-05-29 ENCOUNTER — Ambulatory Visit (INDEPENDENT_AMBULATORY_CARE_PROVIDER_SITE_OTHER): Payer: Medicare Other | Admitting: Gynecology

## 2016-05-29 ENCOUNTER — Encounter: Payer: Self-pay | Admitting: Gynecology

## 2016-05-29 VITALS — BP 118/74 | Ht 60.0 in | Wt 124.0 lb

## 2016-05-29 DIAGNOSIS — Z01411 Encounter for gynecological examination (general) (routine) with abnormal findings: Secondary | ICD-10-CM

## 2016-05-29 DIAGNOSIS — N952 Postmenopausal atrophic vaginitis: Secondary | ICD-10-CM

## 2016-05-29 DIAGNOSIS — M810 Age-related osteoporosis without current pathological fracture: Secondary | ICD-10-CM

## 2016-05-29 DIAGNOSIS — Z79890 Hormone replacement therapy (postmenopausal): Secondary | ICD-10-CM

## 2016-05-29 MED ORDER — ALENDRONATE SODIUM 70 MG PO TABS
70.0000 mg | ORAL_TABLET | ORAL | 4 refills | Status: DC
Start: 2016-05-29 — End: 2017-07-26

## 2016-05-29 MED ORDER — ESTRADIOL 0.075 MG/24HR TD PTWK: 0.0750 mg | MEDICATED_PATCH | TRANSDERMAL | 12 refills | Status: DC

## 2016-05-29 NOTE — Progress Notes (Signed)
    [name] 03/30/1941 [id number]        76 y.o.  G0P0 for breast and pelvic exam.  Past medical history,surgical history, problem list, medications, allergies, family history and social history were all reviewed and documented as reviewed in the EPIC chart.  ROS:  Performed with pertinent positives and negatives included in the history, assessment and plan.   Additional significant findings :  None   Exam: [name] assistant Vitals:   05/29/16 1406  BP: 118/74  Weight: 124 lb (56.2 kg)  Height: 5' (1.524 m)   Body mass index is 24.22 kg/m.  General appearance:  Normal affect, orientation and appearance. Skin: Grossly normal HEENT: Without gross lesions.  No cervical or supraclavicular adenopathy. Thyroid normal.  Lungs:  Clear without wheezing, rales or rhonchi Cardiac: RR, without RMG Abdominal:  Soft, nontender, without masses, guarding, rebound, organomegaly or hernia Breasts:  Examined lying and sitting without masses, retractions, discharge or axillary adenopathy. Pelvic:  Ext, BUS, Vagina: With atrophic changes  Adnexa: Without masses or tenderness    Anus and perineum: Normal   Rectovaginal: Normal sphincter tone without palpated masses or tenderness.    Assessment/Plan:  75 y.o. G0P0 female for breast and pelvic exam.   1. Postmenopausal/atrophic genital changes. Status post TVH/BSO for leiomyoma. Using one half of Cl[place]ara 0.075 mg patch weekly. Doing well with this and wants to continue. I reviewed the most current 2017 NAMS guidelines with risks to include stroke heart attack DVT and breast cancer issues. Benefits to include symptom relief and possible cardiovascular/bone health started early. Patient wants to continue. She clearly understands the risks particularly of thrombosis such as stroke. Refill 1 year provided. 2. Osteoporosis. DEXA 04/2014 T score -2.9. Improved from prior DEXA. On Fosamax for 3 years. Plan repeat DEXA now 2 year interval. Continue  Fosamax now with refill 1 year provided. Patient will follow up for her bone density as scheduled. 3. Pap smear 2012. No Pap smear done today. No history of abnormal Pap smears. Per current screening guidelines we both agree to stop screening based on age and hysterectomy history. 4. Mammography coming due in May/June and I reminded her to schedule this. SBE monthly reviewed. 5. Colonoscopy 2015. Repeat at their recommended interval. 6. Health maintenance. No routine lab work done as patient reports this done elsewhere. Follow up 1 year, sooner as needed.   Anastasio Auerbach MD, 2:26 PM 05/29/2016

## 2016-05-29 NOTE — Patient Instructions (Signed)
Followup for bone density as scheduled. 

## 2016-06-19 ENCOUNTER — Ambulatory Visit (INDEPENDENT_AMBULATORY_CARE_PROVIDER_SITE_OTHER): Payer: Medicare Other | Admitting: Family Medicine

## 2016-06-19 ENCOUNTER — Encounter: Payer: Self-pay | Admitting: Family Medicine

## 2016-06-19 VITALS — BP 110/80 | HR 69 | Temp 98.0°F | Wt 126.4 lb

## 2016-06-19 DIAGNOSIS — L600 Ingrowing nail: Secondary | ICD-10-CM | POA: Diagnosis not present

## 2016-06-19 NOTE — Progress Notes (Signed)
Pre visit review using our clinic review tool, if applicable. No additional management support is needed unless otherwise documented below in the visit note. 

## 2016-06-19 NOTE — Progress Notes (Signed)
Subjective:     Patient ID: [name], female   DOB: September 12, 1940, 76 y.o.   MRN: [id number]  HPI Patient has well-controlled diabetes. She presents with 2 day history of some right great toe pain. No injury. She has some soreness along the medial border and thinks she may have early ingrown toenail. She has not seen any purulent drainage. No redness. Pain is relatively mild. No difficulties with ambulation. No prior history of ingrown toenail.  Past Medical History:  Diagnosis Date  . Abnormal chest x-ray   . Acute bronchitis   . Cancer ([place]) 2001   skin, nose  . Cataract   . Diabetes mellitus    Borderline/no meds  . Diverticulosis of colon   . DNR (do not resuscitate) 2009  . GERD (gastroesophageal reflux disease)   . Hyperlipidemia   . Hypertension   . Kidney stones    1 time  . Migraines    during menopause  . Osteoporosis 04/2014   T score -2.9  . Other diseases of lung, not elsewhere classified   . Pneumothorax on left   . Post-operative nausea and vomiting    1 time  . Skin tag of anus    Past Surgical History:  Procedure Laterality Date  . ABDOMINAL HYSTERECTOMY  1994   fibroids/ complete  . BUNIONECTOMY  2004   left  . CARPAL TUNNEL RELEASE  2000   right   . CATARACT EXTRACTION     Bil  . LUNG SURGERY  2008   left lung VATS for biopsy and removal of blebs with pleurodesis  . OOPHORECTOMY     BSO  . TONSILLECTOMY     as child    reports that she has never smoked. She has never used smokeless tobacco. She reports that she does not drink alcohol or use drugs. family history includes Cancer in her father, mother, and sister; Diabetes in her sister; Emphysema in her maternal aunt; Hyperlipidemia in her sister; Ovarian cancer in her sister; Pulmonary embolism in her father. No Known Allergies   Review of Systems  Constitutional: Negative for chills and fever.       Objective:   Physical Exam  Constitutional: She appears well-developed and well-nourished.   Cardiovascular: Normal rate and regular rhythm.   Pulmonary/Chest: Effort normal and breath sounds normal. No respiratory distress. She has no wheezes. She has no rales.  Skin:  Right great toe reveals no evidence for paronychia. She has very minimal ingrowing nail on the medial border but no granulation tissue and minimally tender to palpation. No warmth. No erythema.       Assessment:     Probably early ingrown right great toenail. No signs of secondary infection    Plan:     -Recommend she try some warm water soaks 2-3 times daily -Consider small cotton wisp to wedge between the nail and skin -Follow-up for any persistent or worsening ingrown nail changes -No indication for surgical excision at this time.  Eulas Post MD [facility] at [facility]

## 2016-06-19 NOTE — Patient Instructions (Signed)
Ingrown Toenail An ingrown toenail occurs when the corner or sides of your toenail grow into the surrounding skin. The big toe is most commonly affected, but it can happen to any of your toes. If your ingrown toenail is not treated, you will be at risk for infection. What are the causes? This condition may be caused by:  Wearing shoes that are too small or tight.  Injury or trauma, such as stubbing your toe or having your toe stepped on.  Improper cutting or care of your toenails.  Being born with (congenital) nail or foot abnormalities, such as having a nail that is too big for your toe. What increases the risk? Risk factors for an ingrown toenail include:  Age. Your nails tend to thicken as you get older, so ingrown nails are more common in older people.  Diabetes.  Cutting your toenails incorrectly.  Blood circulation problems. What are the signs or symptoms? Symptoms may include:  Pain, soreness, or tenderness.  Redness.  Swelling.  Hardening of the skin surrounding the toe. Your ingrown toenail may be infected if there is fluid, pus, or drainage. How is this diagnosed? An ingrown toenail may be diagnosed by medical history and physical exam. If your toenail is infected, your health care provider may test a sample of the drainage. How is this treated? Treatment depends on the severity of your ingrown toenail. Some ingrown toenails may be treated at home. More severe or infected ingrown toenails may require surgery to remove all or part of the nail. Infected ingrown toenails may also be treated with antibiotic medicines. Follow these instructions at home:  If you were prescribed an antibiotic medicine, finish all of it even if you start to feel better.  Soak your foot in warm soapy water for 20 minutes, 3 times per day or as directed by your health care provider.  Carefully lift the edge of the nail away from the sore skin by wedging a small piece of cotton under the  corner of the nail. This may help with the pain. Be careful not to cause more injury to the area.  Wear shoes that fit well. If your ingrown toenail is causing you pain, try wearing sandals, if possible.  Trim your toenails regularly and carefully. Do not cut them in a curved shape. Cut your toenails straight across. This prevents injury to the skin at the corners of the toenail.  Keep your feet clean and dry.  If you are having trouble walking and are given crutches by your health care provider, use them as directed.  Do not pick at your toenail or try to remove it yourself.  Take medicines only as directed by your health care provider.  Keep all follow-up visits as directed by your health care provider. This is important. Contact a health care provider if:  Your symptoms do not improve with treatment. Get help right away if:  You have red streaks that start at your foot and go up your leg.  You have a fever.  You have increased redness, swelling, or pain.  You have fluid, blood, or pus coming from your toenail. This information is not intended to replace advice given to you by your health care provider. Make sure you discuss any questions you have with your health care provider. Document Released: 03/20/2000 Document Revised: 08/23/2015 Document Reviewed: 02/14/2014 Elsevier Interactive Patient Education  2017 [employer]  

## 2016-06-23 ENCOUNTER — Ambulatory Visit (INDEPENDENT_AMBULATORY_CARE_PROVIDER_SITE_OTHER): Payer: Medicare Other

## 2016-06-23 ENCOUNTER — Other Ambulatory Visit: Payer: Self-pay | Admitting: Gynecology

## 2016-06-23 DIAGNOSIS — M810 Age-related osteoporosis without current pathological fracture: Secondary | ICD-10-CM

## 2016-06-24 ENCOUNTER — Encounter: Payer: Self-pay | Admitting: Gynecology

## 2016-06-30 DIAGNOSIS — H1859 Other hereditary corneal dystrophies: Secondary | ICD-10-CM | POA: Diagnosis not present

## 2016-07-16 DIAGNOSIS — H1859 Other hereditary corneal dystrophies: Secondary | ICD-10-CM | POA: Diagnosis not present

## 2016-07-20 ENCOUNTER — Other Ambulatory Visit: Payer: Self-pay | Admitting: Gynecology

## 2016-07-20 DIAGNOSIS — Z1231 Encounter for screening mammogram for malignant neoplasm of breast: Secondary | ICD-10-CM

## 2016-07-22 DIAGNOSIS — L821 Other seborrheic keratosis: Secondary | ICD-10-CM | POA: Diagnosis not present

## 2016-07-22 DIAGNOSIS — L814 Other melanin hyperpigmentation: Secondary | ICD-10-CM | POA: Diagnosis not present

## 2016-07-22 DIAGNOSIS — Z85828 Personal history of other malignant neoplasm of skin: Secondary | ICD-10-CM | POA: Diagnosis not present

## 2016-07-22 DIAGNOSIS — L570 Actinic keratosis: Secondary | ICD-10-CM | POA: Diagnosis not present

## 2016-07-22 DIAGNOSIS — D1801 Hemangioma of skin and subcutaneous tissue: Secondary | ICD-10-CM | POA: Diagnosis not present

## 2016-09-01 ENCOUNTER — Ambulatory Visit
Admission: RE | Admit: 2016-09-01 | Discharge: 2016-09-01 | Disposition: A | Discharge status: Home / Self Care | Payer: Medicare Other | Source: Ambulatory Visit | Attending: Gynecology | Admitting: Gynecology

## 2016-09-01 DIAGNOSIS — Z1231 Encounter for screening mammogram for malignant neoplasm of breast: Secondary | ICD-10-CM | POA: Diagnosis not present

## 2016-09-10 DIAGNOSIS — H1852 Epithelial (juvenile) corneal dystrophy: Secondary | ICD-10-CM | POA: Diagnosis not present

## 2016-09-10 DIAGNOSIS — H04123 Dry eye syndrome of bilateral lacrimal glands: Secondary | ICD-10-CM | POA: Diagnosis not present

## 2016-09-10 DIAGNOSIS — Z961 Presence of intraocular lens: Secondary | ICD-10-CM | POA: Diagnosis not present

## 2016-10-19 DIAGNOSIS — H04123 Dry eye syndrome of bilateral lacrimal glands: Secondary | ICD-10-CM | POA: Diagnosis not present

## 2016-10-19 DIAGNOSIS — H1852 Epithelial (juvenile) corneal dystrophy: Secondary | ICD-10-CM | POA: Diagnosis not present

## 2016-10-19 DIAGNOSIS — Z961 Presence of intraocular lens: Secondary | ICD-10-CM | POA: Diagnosis not present

## 2016-10-19 DIAGNOSIS — H018 Other specified inflammations of eyelid: Secondary | ICD-10-CM | POA: Diagnosis not present

## 2016-10-27 ENCOUNTER — Encounter: Payer: Self-pay | Admitting: Family Medicine

## 2016-10-27 ENCOUNTER — Ambulatory Visit (INDEPENDENT_AMBULATORY_CARE_PROVIDER_SITE_OTHER): Payer: Medicare Other | Admitting: Family Medicine

## 2016-10-27 VITALS — BP 120/80 | HR 75 | Temp 98.3°F | Wt 124.5 lb

## 2016-10-27 DIAGNOSIS — E119 Type 2 diabetes mellitus without complications: Secondary | ICD-10-CM

## 2016-10-27 LAB — POCT GLYCOSYLATED HEMOGLOBIN (HGB A1C): HEMOGLOBIN A1C: 6.1

## 2016-10-27 NOTE — Progress Notes (Signed)
Subjective:     Patient ID: [name], female   DOB: [birth year], 76 y.o.   MRN: [id number]  HPI Patient seen for routine medical follow-up. She has history of type 2 diabetes (which has been diet controlled), hyperlipidemia, COPD, hypertension. She remains on simvastatin and lisinopril. Blood pressures have been stable. She states she has been poorly compliant with exercise over the past few months and somewhat with diet as well. Denies any polyuria or polydipsia. She had eye exam last December which was normal. No history of neuropathy or retinopathy.  No problems with medications  No chest pains, dizziness, dyspnea, appetite changes.  Past Medical History:  Diagnosis Date  . Abnormal chest x-ray   . Acute bronchitis   . Cancer ([place]) 2001   skin, nose  . Cataract   . Diabetes mellitus    Borderline/no meds  . Diverticulosis of colon   . DNR (do not resuscitate) 2009  . GERD (gastroesophageal reflux disease)   . Hyperlipidemia   . Hypertension   . Kidney stones    1 time  . Migraines    during menopause  . Osteoporosis 06/2016   T score -2 .7 stable/improved from prior DEXA  . Other diseases of lung, not elsewhere classified   . Pneumothorax on left   . Post-operative nausea and vomiting    1 time  . Skin tag of anus    Past Surgical History:  Procedure Laterality Date  . ABDOMINAL HYSTERECTOMY  1994   fibroids/ complete  . BUNIONECTOMY  2004   left  . CARPAL TUNNEL RELEASE  2000   right   . CATARACT EXTRACTION     Bil  . LUNG SURGERY  2008   left lung VATS for biopsy and removal of blebs with pleurodesis  . OOPHORECTOMY     BSO  . TONSILLECTOMY     as child    reports that she has never smoked. She has never used smokeless tobacco. She reports that she does not drink alcohol or use drugs. family history includes Cancer in her father, mother, and sister; Diabetes in her sister; Emphysema in her maternal aunt; Hyperlipidemia in her sister; Ovarian cancer in her  sister; Pulmonary embolism in her father. No Known Allergies   Review of Systems  Constitutional: Negative for fatigue.  Eyes: Negative for visual disturbance.  Respiratory: Negative for cough, chest tightness, shortness of breath and wheezing.   Cardiovascular: Negative for chest pain, palpitations and leg swelling.  Neurological: Negative for dizziness, seizures, syncope, weakness, light-headedness and headaches.       Objective:   Physical Exam  Constitutional: She appears well-developed and well-nourished.  Eyes: Pupils are equal, round, and reactive to light.  Neck: Neck supple. No JVD present. No thyromegaly present.  Cardiovascular: Normal rate and regular rhythm.  Exam reveals no gallop.   Pulmonary/Chest: Effort normal and breath sounds normal. No respiratory distress. She has no wheezes. She has no rales.  Musculoskeletal: She exhibits no edema.  Neurological: She is alert.  Skin:  Feet reveal no skin lesions. Good distal foot pulses. Good capillary refill. No calluses. Normal sensation with monofilament testing        Assessment:     #1 type 2 diabetes. Stable with hemoglobin A1c 6.1%  #2 hypertension stable and at goal  #3 dyslipidemia    Plan:     -Continue current medications -Follow-up in 6 months and repeat lipids and hepatic panel then -Establish more consistent aerobic exercise -discussed setting  up Medicare annual wellness visit.  Eulas Post MD [facility] at [facility]

## 2016-12-01 NOTE — Progress Notes (Addendum)
Subjective:   [name] is a 76 y.o. female who presents for Medicare Annual (Subsequent) preventive examination.  The Patient was informed that the wellness visit is to identify future health risk and educate and initiate measures that can reduce risk for increased disease through the lifespan.    Annual Wellness Assessment  Reports health as health good   Preventive Screening -Counseling & Management  Medicare Annual Preventive Care Visit - Subsequent Last OV 07/24  Tdap due now States she took one years ago Cost is a factor and agreed to take one at the pharmacy   Flu due  Diabetic eye exam 02/2014;eye exan completed this year  Colonoscopy 10/2013 - repeat 2020 Mammogram 08/2016  Dexa 06/2016 Osteoporosis and report to OB-GYN Takes alendronate - no fx  Dr. [name]  IMM; states she has the zostavax   VS reviewed;   Diet Breakfast cereal or toast Eats a lot of salads and vegetables, nuts, grains fruit    BMI - 25   Exercise Wt bearing exercise Walks x 40 to 45 minutes when able at least  2 to 3 times a week  House work Will walk early in the am   Dental  6 months   Stressors: no   Sleep patterns: does have insomnia Started after menopause Has not tried any med  Does not feel she needs anything  Lives in a condo and steps in but then level      Cardiac Risk Factors Addressed Hyperlipidemia - ratio chol/hdl 3; hdl 58 ; trig 117 Pre-diabetes a1c 6.1 and bs 121  Glucose runs 120's    Advanced Directives completed   Patient Care Team: Eulas Post, MD as PCP - General (Family Medicine) Assessed for additional providers  Immunization History  Administered Date(s) Administered  . Influenza Split 01/01/2012  . Influenza Whole 12/27/2007, 12/06/2010  . Influenza, High Dose Seasonal PF 01/11/2013, 12/07/2014, 12/06/2015, 12/02/2016  . Influenza,inj,Quad PF,6+ Mos 12/28/2013  . Pneumococcal Conjugate-13 04/06/2006, 10/11/2013  . Pneumococcal  Polysaccharide-23 04/28/2016  . Zoster 03/28/2007   Required Immunizations needed today  Screening test up to date or reviewed for plan of completion Health Maintenance Due  Topic Date Due  . TETANUS/TDAP  10/26/1959  . INFLUENZA VACCINE  11/04/2016   Declines tdap due to cost but did take her flu vaccine  Cardiac Risk Factors include: advanced age (>74men, >5 women);diabetes mellitus     Objective:     Vitals: BP 110/60   Pulse 66   Ht 5' (1.524 m)   Wt 126 lb (57.2 kg)   SpO2 96%   BMI 24.61 kg/m   Body mass index is 24.61 kg/m.   Tobacco History  Smoking Status  . Never Smoker  Smokeless Tobacco  . Never Used     Counseling given: Yes   Past Medical History:  Diagnosis Date  . Abnormal chest x-ray   . Acute bronchitis   . Cancer ([place]) 2001   skin, nose  . Cataract   . Diabetes mellitus    Borderline/no meds  . Diverticulosis of colon   . DNR (do not resuscitate) 2009  . GERD (gastroesophageal reflux disease)   . Hyperlipidemia   . Hypertension   . Kidney stones    1 time  . Migraines    during menopause  . Osteoporosis 06/2016   T score -2 .7 stable/improved from prior DEXA  . Other diseases of lung, not elsewhere classified   . Pneumothorax on left   .  Post-operative nausea and vomiting    1 time  . Skin tag of anus    Past Surgical History:  Procedure Laterality Date  . ABDOMINAL HYSTERECTOMY  1994   fibroids/ complete  . BUNIONECTOMY  2004   left  . CARPAL TUNNEL RELEASE  2000   right   . CATARACT EXTRACTION     Bil  . LUNG SURGERY  2008   left lung VATS for biopsy and removal of blebs with pleurodesis  . OOPHORECTOMY     BSO  . TONSILLECTOMY     as child   Family History  Problem Relation Age of Onset  . Cancer Mother        bone  . Diabetes Sister   . Hyperlipidemia Sister   . Ovarian cancer Sister   . Pulmonary embolism Father   . Cancer Father        Facial  . Cancer Sister        melanoma  . Emphysema Maternal  Aunt   . Breast cancer Neg Hx    History  Sexual Activity  . Sexual activity: Not Currently    Comment: 1st intercourse 76 yo-Fewer than 5 partners    Outpatient Encounter Prescriptions as of 12/02/2016  Medication Sig  . alendronate (FOSAMAX) 70 MG tablet Take 1 tablet (70 mg total) by mouth every 7 (seven) days. Take with a full glass of water on an empty stomach.  [name] aspirin EC 81 MG tablet Take 81 mg by mouth daily.  . Calcium Citrate-Vitamin D (CALCIUM CITRATE + D3 PO) 800mg  calcium 1000 IU Vit. D. Take by mouth daily.  . Chromium-Cinnamon (CINNAMON PLUS CHROMIUM PO) 2000 mg cinnamon 200 mcg Chromium Take by mouth daily.  [name] estradiol (CLIMARA - DOSED IN MG/24 HR) 0.075 mg/24hr patch Place 1 patch (0.075 mg total) onto the skin once a week. Uses 1/2 patch weekly  . glucose blood test strip Use as instructed  . lisinopril (PRINIVIL,ZESTRIL) 10 MG tablet Take 1 tablet (10 mg total) by mouth daily. Per Dr Elease Hashimoto  . Magnesium 250 MG TABS Take 250 mg by mouth daily.   . Multiple Vitamin (MULTIVITAMIN PO) Take 1 tablet by mouth daily.   . Omega-3 Fatty Acids (OMEGA 3 PO) Take 2,000 mg by mouth daily.  . simvastatin (ZOCOR) 20 MG tablet Take 1 tablet (20 mg total) by mouth daily.  [name] Ultilet Classic Lancets MISC Testing BS once daily   No facility-administered encounter medications on file as of 12/02/2016.     Activities of Daily Living In your present state of health, do you have any difficulty performing the following activities: 12/02/2016  Hearing? N  Vision? N  Difficulty concentrating or making decisions? N  Walking or climbing stairs? N  Dressing or bathing? N  Doing errands, shopping? N  Preparing Food and eating ? N  Using the Toilet? N  In the past six months, have you accidently leaked urine? Y  Comment bladder doesn't expand as much   Do you have problems with loss of bowel control? N  Managing your Medications? N  Managing your Finances? N  Housekeeping or  managing your Housekeeping? N  Some recent data might be hidden    Patient Care Team: Eulas Post, MD as PCP - General (Family Medicine)    Assessment:     Exercise Activities and Dietary recommendations Current Exercise Habits: Home exercise routine, Type of exercise: walking, Time (Minutes): 30, Frequency (Times/Week): 5 (this is the goal ),  Weekly Exercise (Minutes/Week): 150  Goals    . Exercise 150 minutes per week (moderate activity)          Will walk more  Watch unhealthy carbs       Fall Risk Fall Risk  12/02/2016 06/19/2016 10/23/2015 04/24/2015 10/11/2013  Falls in the past year? No No No No No   Depression Screen PHQ 2/9 Scores 12/02/2016 06/19/2016 10/23/2015 04/24/2015  PHQ - 2 Score 0 0 0 0     Cognitive Function Ad8 score reviewed for issues:  Issues making decisions:  Less interest in hobbies / activities:  Repeats questions, stories (family complaining):  Trouble using ordinary gadgets (microwave, computer, phone):  Forgets the month or year:   Mismanaging finances:   Remembering appts:  Daily problems with thinking and/or memory: Ad8 score is=0 No dementia in her family   Lots of cancer in family; cancer in jaw She has had skin cancers Dermatologist regularly           Immunization History  Administered Date(s) Administered  . Influenza Split 01/01/2012  . Influenza Whole 12/27/2007, 12/06/2010  . Influenza, High Dose Seasonal PF 01/11/2013, 12/07/2014, 12/06/2015, 12/02/2016  . Influenza,inj,Quad PF,6+ Mos 12/28/2013  . Pneumococcal Conjugate-13 04/06/2006, 10/11/2013  . Pneumococcal Polysaccharide-23 04/28/2016  . Zoster 03/28/2007   Screening Tests Health Maintenance  Topic Date Due  . TETANUS/TDAP  10/26/1959  . INFLUENZA VACCINE  11/04/2016  . OPHTHALMOLOGY EXAM  03/11/2017  . HEMOGLOBIN A1C  04/29/2017  . FOOT EXAM  10/27/2017  . COLONOSCOPY  10/25/2018  . DEXA SCAN  Completed  . PNA vac Low Risk Adult  Completed       Plan:     PCP Notes   Health Maintenance Agreed to take flu vaccine today Stated tetanus was cost prohibitive. We will take Tdap At the pharmacy.  OB/GYN follows DEXA scan and she is currently under treatment with alendronate. Discussed the value of weightbearing exercise. States she will try to start walking 5 days a week as she is able.  Educated regarding the shingrix.   Abnormal Screens  No abnormal screens noted today    Referrals  No referrals today  Patient concerns; Patient complained of dry but is currently being treated. She has side appointments annually in December.  Nurse Concerns; As noted  Next PCP apt next appointment is is January 25th*    I have personally reviewed and noted the following in the patient's chart:   . Medical and social history . Use of alcohol, tobacco or illicit drugs  . Current medications and supplements . Functional ability and status . Nutritional status . Physical activity . Advanced directives . List of other physicians . Hospitalizations, surgeries, and ER visits in previous 12 months . Vitals . Screenings to include cognitive, depression, and falls . Referrals and appointments  In addition, I have reviewed and discussed with patient certain preventive protocols, quality metrics, and best practice recommendations. A written personalized care plan for preventive services as well as general preventive health recommendations were provided to patient.     DJSHF,WYOVZ, RN  12/02/2016   Agree with assessment as above.  Eulas Post MD [facility]

## 2016-12-02 ENCOUNTER — Ambulatory Visit (INDEPENDENT_AMBULATORY_CARE_PROVIDER_SITE_OTHER): Payer: Medicare Other

## 2016-12-02 DIAGNOSIS — Z0000 Encounter for general adult medical examination without abnormal findings: Secondary | ICD-10-CM

## 2016-12-02 DIAGNOSIS — Z23 Encounter for immunization: Secondary | ICD-10-CM | POA: Diagnosis not present

## 2016-12-02 NOTE — Patient Instructions (Addendum)
Ms. Castanon , Thank you for taking time to come for your Medicare Wellness Visit. I appreciate your ongoing commitment to your health goals. Please review the following plan we discussed and let me know if I can assist you in the future.   A Tetanus is recommended every 10 years. Medicare covers a tetanus if you have a cut or wound; otherwise, there may be a charge. If you had not had a tetanus with pertusses, known as the Tdap, you can take this anytime.  May be more cost effective at the pharmacy   Shingrix is a vaccine for the prevention of Shingles in Adults 35 and older.  If you are on Medicare, you can request a prescription from your doctor to be filled at a pharmacy.  Please check with your benefits regarding applicable copays or out of pocket expenses.  The Shingrix is given in 2 vaccines approx 8 weeks apart. You must receive the 2nd dose prior to 6 months from receipt of the first.    Prevention of falls: Remove rugs or any tripping hazards in the home Use Non slip mats in bathtubs and showers Placing grab bars next to the toilet and or shower Placing handrails on both sides of the stair way Adding extra lighting in the home.   Personal safety issues reviewed:  1. Consider starting a community watch program per [facility] 2.  Changes batteries is smoke detector and/or carbon monoxide detector  3.  If you have firearms; keep them in a safe place 4.  Wear protection when in the sun; Always wear sunscreen or a hat; It is good to have your doctor check your skin annually or review any new areas of concern 5. Driving safety; Keep in the right lane; stay 3 car lengths behind the car in front of you on the highway; look 3 times prior to pulling out; carry your cell phone everywhere you go!    Learn about the Yellow Dot program:  The program allows first responders at your emergency to have access to who your physician is, as well as your medications and medical  conditions.  Citizens requesting the Yellow Dot Packages should contact Master Corporal Nunzio Cobbs at the [facility] [phone] for the first week of the program and beginning the week after Easter citizens should contact their Scientist, physiological.       These are the goals we discussed: Goals    . Exercise 150 minutes per week (moderate activity)          Will walk more  Watch unhealthy carbs        This is a list of the screening recommended for you and due dates:  Health Maintenance  Topic Date Due  . Tetanus Vaccine  10/26/1959  . Flu Shot  11/04/2016  . Eye exam for diabetics  03/11/2017  . Hemoglobin A1C  04/29/2017  . Complete foot exam   10/27/2017  . Colon Cancer Screening  10/25/2018  . DEXA scan (bone density measurement)  Completed  . Pneumonia vaccines  Completed     Health Maintenance for Postmenopausal Women Menopause is a normal process in which your reproductive ability comes to an end. This process happens gradually over a span of months to years, usually between the ages of 69 and 89. Menopause is complete when you have missed 12 consecutive menstrual periods. It is important to talk with your health care provider about some of the most common conditions  that affect postmenopausal women, such as heart disease, cancer, and bone loss (osteoporosis). Adopting a healthy lifestyle and getting preventive care can help to promote your health and wellness. Those actions can also lower your chances of developing some of these common conditions. What should I know about menopause? During menopause, you may experience a number of symptoms, such as:  Moderate-to-severe hot flashes.  Night sweats.  Decrease in sex drive.  Mood swings.  Headaches.  Tiredness.  Irritability.  Memory problems.  Insomnia.  Choosing to treat or not to treat menopausal changes is an individual decision that you make with your health care  provider. What should I know about hormone replacement therapy and supplements? Hormone therapy products are effective for treating symptoms that are associated with menopause, such as hot flashes and night sweats. Hormone replacement carries certain risks, especially as you become older. If you are thinking about using estrogen or estrogen with progestin treatments, discuss the benefits and risks with your health care provider. What should I know about heart disease and stroke? Heart disease, heart attack, and stroke become more likely as you age. This may be due, in part, to the hormonal changes that your body experiences during menopause. These can affect how your body processes dietary fats, triglycerides, and cholesterol. Heart attack and stroke are both medical emergencies. There are many things that you can do to help prevent heart disease and stroke:  Have your blood pressure checked at least every 1-2 years. High blood pressure causes heart disease and increases the risk of stroke.  If you are 6-63 years old, ask your health care provider if you should take aspirin to prevent a heart attack or a stroke.  Do not use any tobacco products, including cigarettes, chewing tobacco, or electronic cigarettes. If you need help quitting, ask your health care provider.  It is important to eat a healthy diet and maintain a healthy weight. ? Be sure to include plenty of vegetables, fruits, low-fat dairy products, and lean protein. ? Avoid eating foods that are high in solid fats, added sugars, or salt (sodium).  Get regular exercise. This is one of the most important things that you can do for your health. ? Try to exercise for at least 150 minutes each week. The type of exercise that you do should increase your heart rate and make you sweat. This is known as moderate-intensity exercise. ? Try to do strengthening exercises at least twice each week. Do these in addition to the moderate-intensity  exercise.  Know your numbers.Ask your health care provider to check your cholesterol and your blood glucose. Continue to have your blood tested as directed by your health care provider.  What should I know about cancer screening? There are several types of cancer. Take the following steps to reduce your risk and to catch any cancer development as early as possible. Breast Cancer  Practice breast self-awareness. ? This means understanding how your breasts normally appear and feel. ? It also means doing regular breast self-exams. Let your health care provider know about any changes, no matter how small.  If you are 40 or older, have a clinician do a breast exam (clinical breast exam or CBE) every year. Depending on your age, family history, and medical history, it may be recommended that you also have a yearly breast X-ray (mammogram).  If you have a family history of breast cancer, talk with your health care provider about genetic screening.  If you are at high risk for  breast cancer, talk with your health care provider about having an MRI and a mammogram every year.  Breast cancer (BRCA) gene test is recommended for women who have family members with BRCA-related cancers. Results of the assessment will determine the need for genetic counseling and BRCA1 and for BRCA2 testing. BRCA-related cancers include these types: ? Breast. This occurs in males or females. ? Ovarian. ? Tubal. This may also be called fallopian tube cancer. ? Cancer of the abdominal or pelvic lining (peritoneal cancer). ? Prostate. ? Pancreatic.  Cervical, Uterine, and Ovarian Cancer Your health care provider may recommend that you be screened regularly for cancer of the pelvic organs. These include your ovaries, uterus, and vagina. This screening involves a pelvic exam, which includes checking for microscopic changes to the surface of your cervix (Pap test).  For women ages 21-65, health care providers may recommend a  pelvic exam and a Pap test every three years. For women ages 25-65, they may recommend the Pap test and pelvic exam, combined with testing for human papilloma virus (HPV), every five years. Some types of HPV increase your risk of cervical cancer. Testing for HPV may also be done on women of any age who have unclear Pap test results.  Other health care providers may not recommend any screening for nonpregnant women who are considered low risk for pelvic cancer and have no symptoms. Ask your health care provider if a screening pelvic exam is right for you.  If you have had past treatment for cervical cancer or a condition that could lead to cancer, you need Pap tests and screening for cancer for at least 20 years after your treatment. If Pap tests have been discontinued for you, your risk factors (such as having a new sexual partner) need to be reassessed to determine if you should start having screenings again. Some women have medical problems that increase the chance of getting cervical cancer. In these cases, your health care provider may recommend that you have screening and Pap tests more often.  If you have a family history of uterine cancer or ovarian cancer, talk with your health care provider about genetic screening.  If you have vaginal bleeding after reaching menopause, tell your health care provider.  There are currently no reliable tests available to screen for ovarian cancer.  Lung Cancer Lung cancer screening is recommended for adults 84-74 years old who are at high risk for lung cancer because of a history of smoking. A yearly low-dose CT scan of the lungs is recommended if you:  Currently smoke.  Have a history of at least 30 pack-years of smoking and you currently smoke or have quit within the past 15 years. A pack-year is smoking an average of one pack of cigarettes per day for one year.  Yearly screening should:  Continue until it has been 15 years since you quit.  Stop if  you develop a health problem that would prevent you from having lung cancer treatment.  Colorectal Cancer  This type of cancer can be detected and can often be prevented.  Routine colorectal cancer screening usually begins at age 68 and continues through age 2.  If you have risk factors for colon cancer, your health care provider may recommend that you be screened at an earlier age.  If you have a family history of colorectal cancer, talk with your health care provider about genetic screening.  Your health care provider may also recommend using home test kits to check for hidden  blood in your stool.  A small camera at the end of a tube can be used to examine your colon directly (sigmoidoscopy or colonoscopy). This is done to check for the earliest forms of colorectal cancer.  Direct examination of the colon should be repeated every 5-10 years until age 74. However, if early forms of precancerous polyps or small growths are found or if you have a family history or genetic risk for colorectal cancer, you may need to be screened more often.  Skin Cancer  Check your skin from head to toe regularly.  Monitor any moles. Be sure to tell your health care provider: ? About any new moles or changes in moles, especially if there is a change in a mole's shape or color. ? If you have a mole that is larger than the size of a pencil eraser.  If any of your family members has a history of skin cancer, especially at a young age, talk with your health care provider about genetic screening.  Always use sunscreen. Apply sunscreen liberally and repeatedly throughout the day.  Whenever you are outside, protect yourself by wearing long sleeves, pants, a wide-brimmed hat, and sunglasses.  What should I know about osteoporosis? Osteoporosis is a condition in which bone destruction happens more quickly than new bone creation. After menopause, you may be at an increased risk for osteoporosis. To help prevent  osteoporosis or the bone fractures that can happen because of osteoporosis, the following is recommended:  If you are 57-34 years old, get at least 1,000 mg of calcium and at least 600 mg of vitamin D per day.  If you are older than age 1 but younger than age 40, get at least 1,200 mg of calcium and at least 600 mg of vitamin D per day.  If you are older than age 36, get at least 1,200 mg of calcium and at least 800 mg of vitamin D per day.  Smoking and excessive alcohol intake increase the risk of osteoporosis. Eat foods that are rich in calcium and vitamin D, and do weight-bearing exercises several times each week as directed by your health care provider. What should I know about how menopause affects my mental health? Depression may occur at any age, but it is more common as you become older. Common symptoms of depression include:  Low or sad mood.  Changes in sleep patterns.  Changes in appetite or eating patterns.  Feeling an overall lack of motivation or enjoyment of activities that you previously enjoyed.  Frequent crying spells.  Talk with your health care provider if you think that you are experiencing depression. What should I know about immunizations? It is important that you get and maintain your immunizations. These include:  Tetanus, diphtheria, and pertussis (Tdap) booster vaccine.  Influenza every year before the flu season begins.  Pneumonia vaccine.  Shingles vaccine.  Your health care provider may also recommend other immunizations. This information is not intended to replace advice given to you by your health care provider. Make sure you discuss any questions you have with your health care provider. Document Released: 05/15/2005 Document Revised: 10/11/2015 Document Reviewed: 12/25/2014 Elsevier Interactive Patient Education  2018 [place] in the Home Falls can cause injuries and can affect people from all age groups. There are  many simple things that you can do to make your home safe and to help prevent falls. What can I do on the outside of my home?  Regularly repair  the edges of walkways and driveways and fix any cracks.  Remove high doorway thresholds.  Trim any shrubbery on the main path into your home.  Use bright outdoor lighting.  Clear walkways of debris and clutter, including tools and rocks.  Regularly check that handrails are securely fastened and in good repair. Both sides of any steps should have handrails.  Install guardrails along the edges of any raised decks or porches.  Have leaves, snow, and ice cleared regularly.  Use sand or salt on walkways during winter months.  In the garage, clean up any spills right away, including grease or oil spills. What can I do in the bathroom?  Use night lights.  Install grab bars by the toilet and in the tub and shower. Do not use towel bars as grab bars.  Use non-skid mats or decals on the floor of the tub or shower.  If you need to sit down while you are in the shower, use a plastic, non-slip stool.  Keep the floor dry. Immediately clean up any water that spills on the floor.  Remove soap buildup in the tub or shower on a regular basis.  Attach bath mats securely with double-sided non-slip rug tape.  Remove throw rugs and other tripping hazards from the floor. What can I do in the bedroom?  Use night lights.  Make sure that a bedside light is easy to reach.  Do not use oversized bedding that drapes onto the floor.  Have a firm chair that has side arms to use for getting dressed.  Remove throw rugs and other tripping hazards from the floor. What can I do in the kitchen?  Clean up any spills right away.  Avoid walking on wet floors.  Place frequently used items in easy-to-reach places.  If you need to reach for something above you, use a sturdy step stool that has a grab bar.  Keep electrical cables out of the way.  Do not use  floor polish or wax that makes floors slippery. If you have to use wax, make sure that it is non-skid floor wax.  Remove throw rugs and other tripping hazards from the floor. What can I do in the stairways?  Do not leave any items on the stairs.  Make sure that there are handrails on both sides of the stairs. Fix handrails that are broken or loose. Make sure that handrails are as long as the stairways.  Check any carpeting to make sure that it is firmly attached to the stairs. Fix any carpet that is loose or worn.  Avoid having throw rugs at the top or bottom of stairways, or secure the rugs with carpet tape to prevent them from moving.  Make sure that you have a light switch at the top of the stairs and the bottom of the stairs. If you do not have them, have them installed. What are some other fall prevention tips?  Wear closed-toe shoes that fit well and support your feet. Wear shoes that have rubber soles or low heels.  When you use a stepladder, make sure that it is completely opened and that the sides are firmly locked. Have someone hold the ladder while you are using it. Do not climb a closed stepladder.  Add color or contrast paint or tape to grab bars and handrails in your home. Place contrasting color strips on the first and last steps.  Use mobility aids as needed, such as canes, walkers, scooters, and crutches.  Turn on  lights if it is dark. Replace any light bulbs that burn out.  Set up furniture so that there are clear paths. Keep the furniture in the same spot.  Fix any uneven floor surfaces.  Choose a carpet design that does not hide the edge of steps of a stairway.  Be aware of any and all pets.  Review your medicines with your healthcare provider. Some medicines can cause dizziness or changes in blood pressure, which increase your risk of falling. Talk with your health care provider about other ways that you can decrease your risk of falls. This may include working  with a physical therapist or trainer to improve your strength, balance, and endurance. This information is not intended to replace advice given to you by your health care provider. Make sure you discuss any questions you have with your health care provider. Document Released: 03/13/2002 Document Revised: 08/20/2015 Document Reviewed: 04/27/2014 Elsevier Interactive Patient Education  2017 [employer].

## 2017-01-19 DIAGNOSIS — H04123 Dry eye syndrome of bilateral lacrimal glands: Secondary | ICD-10-CM | POA: Diagnosis not present

## 2017-01-26 DIAGNOSIS — L720 Epidermal cyst: Secondary | ICD-10-CM | POA: Diagnosis not present

## 2017-01-26 DIAGNOSIS — L821 Other seborrheic keratosis: Secondary | ICD-10-CM | POA: Diagnosis not present

## 2017-01-26 DIAGNOSIS — D1801 Hemangioma of skin and subcutaneous tissue: Secondary | ICD-10-CM | POA: Diagnosis not present

## 2017-01-26 DIAGNOSIS — L570 Actinic keratosis: Secondary | ICD-10-CM | POA: Diagnosis not present

## 2017-01-26 DIAGNOSIS — L814 Other melanin hyperpigmentation: Secondary | ICD-10-CM | POA: Diagnosis not present

## 2017-01-26 DIAGNOSIS — Z85828 Personal history of other malignant neoplasm of skin: Secondary | ICD-10-CM | POA: Diagnosis not present

## 2017-02-15 ENCOUNTER — Telehealth: Payer: Self-pay | Admitting: Family Medicine

## 2017-02-15 NOTE — Telephone Encounter (Signed)
Pt would like you to know that the company [employer] will be sending a request for new DM supplies. Pt states the last couple of times they requested, she had trouble getting. Pt would like you to call her concerning this request.

## 2017-02-16 NOTE — Telephone Encounter (Signed)
Paperwork received, completed, faxed and confirmed.

## 2017-04-02 ENCOUNTER — Other Ambulatory Visit: Payer: Self-pay | Admitting: *Deleted

## 2017-04-02 MED ORDER — ESTRADIOL 0.075 MG/24HR TD PTWK: 0.0750 mg | MEDICATED_PATCH | TRANSDERMAL | 6 refills | Status: DC

## 2017-04-02 NOTE — Telephone Encounter (Signed)
Pt called up to date with GYN exam due in March 2019. States Medicare will not pay for GYN exam this year and she is not able to pay. Just had Medicare exam with PCP no change in medical history.  Will follow up with patient 6 months after due to again assure she is doing ok before refilling again.  Rx for Climara patch sent to pharmacy. KW CMA

## 2017-04-05 ENCOUNTER — Telehealth: Payer: Self-pay

## 2017-04-05 NOTE — Telephone Encounter (Signed)
Pharmacy sent fax to clarify instructions for Est patch.  I called patient to clarify. However, she said she has not used this pharmacy in years. I called [facility] and let them know that she will not be getting that Rx there and so directions are not an issue at this time.

## 2017-04-09 DIAGNOSIS — E119 Type 2 diabetes mellitus without complications: Secondary | ICD-10-CM | POA: Diagnosis not present

## 2017-04-09 LAB — HM DIABETES EYE EXAM

## 2017-04-19 ENCOUNTER — Encounter: Payer: Self-pay | Admitting: Family Medicine

## 2017-04-27 ENCOUNTER — Ambulatory Visit: Payer: Medicare Other | Admitting: Family Medicine

## 2017-04-30 ENCOUNTER — Ambulatory Visit (INDEPENDENT_AMBULATORY_CARE_PROVIDER_SITE_OTHER): Payer: Medicare Other | Admitting: Family Medicine

## 2017-04-30 ENCOUNTER — Ambulatory Visit (INDEPENDENT_AMBULATORY_CARE_PROVIDER_SITE_OTHER)
Admission: RE | Admit: 2017-04-30 | Discharge: 2017-04-30 | Disposition: A | Discharge status: Home / Self Care | Payer: Medicare Other | Source: Ambulatory Visit | Attending: Family Medicine | Admitting: Family Medicine

## 2017-04-30 ENCOUNTER — Encounter: Payer: Self-pay | Admitting: Family Medicine

## 2017-04-30 VITALS — BP 124/78 | HR 68 | Temp 97.5°F | Ht 59.9 in | Wt 126.4 lb

## 2017-04-30 DIAGNOSIS — R0600 Dyspnea, unspecified: Secondary | ICD-10-CM

## 2017-04-30 DIAGNOSIS — I10 Essential (primary) hypertension: Secondary | ICD-10-CM | POA: Diagnosis not present

## 2017-04-30 DIAGNOSIS — E785 Hyperlipidemia, unspecified: Secondary | ICD-10-CM

## 2017-04-30 DIAGNOSIS — E119 Type 2 diabetes mellitus without complications: Secondary | ICD-10-CM

## 2017-04-30 LAB — BASIC METABOLIC PANEL
BUN: 16 mg/dL (ref 6–23)
CO2: 31 meq/L (ref 19–32)
Calcium: 9.4 mg/dL (ref 8.4–10.5)
Chloride: 101 mEq/L (ref 96–112)
Creatinine, Ser: 0.7 mg/dL (ref 0.40–1.20)
GFR: 86.35 mL/min (ref >60.00)
Glucose, Bld: 128 mg/dL — ABNORMAL HIGH (ref 70–99)
POTASSIUM: 4.4 meq/L (ref 3.5–5.1)
SODIUM: 140 meq/L (ref 135–145)

## 2017-04-30 LAB — LIPID PANEL
CHOL/HDL RATIO: 3
Cholesterol: 151 mg/dL (ref 0–200)
HDL: 53.7 mg/dL (ref >39.00)
LDL CALC: 72 mg/dL (ref 0–99)
NONHDL: 97.75
Triglycerides: 127 mg/dL (ref 0.0–149.0)
VLDL: 25.4 mg/dL (ref 0.0–40.0)

## 2017-04-30 LAB — HEPATIC FUNCTION PANEL
ALBUMIN: 4.4 g/dL (ref 3.5–5.2)
ALT: 21 U/L (ref 0–35)
AST: 17 U/L (ref 0–37)
Alkaline Phosphatase: 41 U/L (ref 39–117)
BILIRUBIN TOTAL: 0.9 mg/dL (ref 0.2–1.2)
Bilirubin, Direct: 0.2 mg/dL (ref 0.0–0.3)
Total Protein: 6.4 g/dL (ref 6.0–8.3)

## 2017-04-30 LAB — HEMOGLOBIN A1C: HEMOGLOBIN A1C: 6.6 % — AB (ref 4.6–6.5)

## 2017-04-30 MED ORDER — LISINOPRIL 10 MG PO TABS: 10.0000 mg | ORAL_TABLET | Freq: Every day | ORAL | 3 refills | Status: DC

## 2017-04-30 MED ORDER — SIMVASTATIN 20 MG PO TABS: 20.0000 mg | ORAL_TABLET | Freq: Every day | ORAL | 3 refills | Status: DC

## 2017-04-30 NOTE — Progress Notes (Signed)
Subjective:     Patient ID: [name], female   DOB: 1940-04-17, 77 y.o.   MRN: [id number]  HPI Patient seen for medical follow-up. She has history of type 2 diabetes, hypertension, hyperlipidemia. Medications reviewed. Compliant with all. Blood sugars been running slightly higher- around 130 fasting. A1c in past has consistently been good. She remains on lisinopril for hypertension and simvastatin for hyperlipidemia. Requesting refills. Needs follow-up labs.  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 She ended up having surgery. She had biopsies which showed she states "fibrocystic "changes. She's recently had some mild dyspnea when lying supine on her left side. She was concerned whether she may be having some recurrent problems. No cough. No fever.  Patient also recently relates some recent sharp pains radiating down her left arm extremity from the buttock area down toward the knee. Question intermittent weakness. No urine or stool incontinence. Pain is relatively mild. Very intermittent. Sometimes worse when she first starts to walk but improves with ambulation.  Past Medical History:  Diagnosis Date  . Abnormal chest x-ray   . Acute bronchitis   . Cancer ([place]) 2001   skin, nose  . Cataract   . Diabetes mellitus    Borderline/no meds  . Diverticulosis of colon   . DNR (do not resuscitate) 2009  . GERD (gastroesophageal reflux disease)   . Hyperlipidemia   . Hypertension   . Kidney stones    1 time  . Migraines    during menopause  . Osteoporosis 06/2016   T score -2 .7 stable/improved from prior DEXA  . Other diseases of lung, not elsewhere classified   . Pneumothorax on left   . Post-operative nausea and vomiting    1 time  . Skin tag of anus    Past Surgical History:  Procedure Laterality Date  . ABDOMINAL HYSTERECTOMY  1994   fibroids/ complete  . BUNIONECTOMY  2004   left  . CARPAL TUNNEL RELEASE  2000   right   . CATARACT EXTRACTION     Bil  .  LUNG SURGERY  2008   left lung VATS for biopsy and removal of blebs with pleurodesis  . OOPHORECTOMY     BSO  . TONSILLECTOMY     as child    reports that  has never smoked. she has never used smokeless tobacco. She reports that she does not drink alcohol or use drugs. family history includes Cancer in her father, mother, and sister; Diabetes in her sister; Emphysema in her maternal aunt; Hyperlipidemia in her sister; Ovarian cancer in her sister; Pulmonary embolism in her father. No Known Allergies   Review of Systems  Constitutional: Negative for fatigue.  Eyes: Negative for visual disturbance.  Respiratory: Positive for shortness of breath. Negative for cough, chest tightness and wheezing.   Cardiovascular: Negative for chest pain, palpitations and leg swelling.  Genitourinary: Negative for dysuria.  Neurological: Negative for dizziness, seizures, syncope, weakness, light-headedness and headaches.       Objective:   Physical Exam  Constitutional: She appears well-developed and well-nourished.  Eyes: Pupils are equal, round, and reactive to light.  Neck: Neck supple. No JVD present. No thyromegaly present.  Cardiovascular: Normal rate and regular rhythm. Exam reveals no gallop.  Pulmonary/Chest: Effort normal and breath sounds normal. No respiratory distress. She has no wheezes. She has no rales.  Musculoskeletal: She exhibits no edema.  Straight leg raise are negative. Great range of motion both hips. No point tenderness lateral  hip region.  Neurological: She is alert.  Full-strength lower extremities with symmetric reflexes       Assessment:     #1 type 2 diabetes history of good control  #2 hypertension stable and at goal  #3 dyslipidemia  #4 history of spontaneous pneumothorax. She's had some recent mild dyspnea when lying supine but not with activity. Nonfocal exam with good breath sounds throughout  #5 recent intermittent left lower extremity pain. Question  lumbar radiculitis    Plan:     -check labs with basic metabolic panel, hepatic panel, lipid panel, A1c -refill lisinopril and simvastatin -Obtain chest x-ray -Follow-up promptly for any lower extremity weakness or progressive pain  Eulas Post MD [facility]

## 2017-06-02 ENCOUNTER — Ambulatory Visit (INDEPENDENT_AMBULATORY_CARE_PROVIDER_SITE_OTHER): Payer: Medicare Other | Admitting: Gynecology

## 2017-06-02 ENCOUNTER — Encounter: Payer: Self-pay | Admitting: Gynecology

## 2017-06-02 ENCOUNTER — Other Ambulatory Visit: Payer: Self-pay | Admitting: *Deleted

## 2017-06-02 VITALS — BP 124/80 | Ht 60.0 in | Wt 127.0 lb

## 2017-06-02 DIAGNOSIS — Z01411 Encounter for gynecological examination (general) (routine) with abnormal findings: Secondary | ICD-10-CM | POA: Diagnosis not present

## 2017-06-02 DIAGNOSIS — N952 Postmenopausal atrophic vaginitis: Secondary | ICD-10-CM

## 2017-06-02 DIAGNOSIS — M810 Age-related osteoporosis without current pathological fracture: Secondary | ICD-10-CM

## 2017-06-02 DIAGNOSIS — Z79890 Hormone replacement therapy (postmenopausal): Secondary | ICD-10-CM

## 2017-06-02 MED ORDER — SIMVASTATIN 20 MG PO TABS: 20.0000 mg | ORAL_TABLET | Freq: Every day | ORAL | 3 refills | Status: DC

## 2017-06-02 MED ORDER — ESTRADIOL 0.075 MG/24HR TD PTWK: 0.0750 mg | MEDICATED_PATCH | TRANSDERMAL | 12 refills | Status: DC

## 2017-06-02 NOTE — Progress Notes (Signed)
    [name] April 04, 1941 [id number]        77 y.o.  G0P0 for breast and pelvic exam.  Doing well without complaints.  Several issues noted below.  Past medical history,surgical history, problem list, medications, allergies, family history and social history were all reviewed and documented as reviewed in the EPIC chart.  ROS:  Performed with pertinent positives and negatives included in the history, assessment and plan.   Additional significant findings : None   Exam: [name] assistant Vitals:   06/02/17 1359  BP: 124/80  Weight: 127 lb (57.6 kg)  Height: 5' (1.524 m)   Body mass index is 24.8 kg/m.  General appearance:  Normal affect, orientation and appearance. Skin: Grossly normal HEENT: Without gross lesions.  No cervical or supraclavicular adenopathy. Thyroid normal.  Lungs:  Clear without wheezing, rales or rhonchi Cardiac: RR, without RMG Abdominal:  Soft, nontender, without masses, guarding, rebound, organomegaly or hernia Breasts:  Examined lying and sitting without masses, retractions, discharge or axillary adenopathy. Pelvic:  Ext, BUS, Vagina: With atrophic changes  Adnexa: Without masses or tenderness    Anus and perineum: Normal   Rectovaginal: Normal sphincter tone without palpated masses or tenderness.    Assessment/Plan:  77 y.o. G0P0 female for breast and pelvic exam.   1. Postmenopausal/atrophic genital changes.  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  Using one half of a Climara 0.075 mg patch weekly.  Has unacceptable hot flushes and sweats when she tries to stop.  I reviewed the risks of HRT particularly with her age to include increased risk of stroke heart attack DVT.  We also discussed the breast cancer issue with HRT.  At this point the patient understands and accepts the risks.  Refill times 1 year provided. 2. Osteoporosis.  DEXA 2018 T score -2.7 stable/improved from prior DEXA.  Had been on Fosamax for approximately 4 years.  Discontinued last  year because she started having leg pain although notes that this seems to have continued despite stopping the medication and she thinks it is muscular.  We will plan on stopping her Fosamax now and repeating her bone density next year at 2-year interval and will see how she is doing and then rediscuss treatment options if needed.  Patient is comfortable with this approach. 3. Pap smear 2012.  No Pap smear done today.  No history of abnormal Pap smears.  We both agree to stop screening per current screening guidelines based on age and hysterectomy history. 4. Colonoscopy 2015.  Repeat at their recommended interval. 5. Health maintenance.  No routine lab work done as patient does this elsewhere.  Follow-up 1 year, sooner as needed.   Anastasio Auerbach MD, 2:29 PM 06/02/2017

## 2017-06-02 NOTE — Patient Instructions (Signed)
Follow up for annual exam in one year 

## 2017-06-26 DIAGNOSIS — R21 Rash and other nonspecific skin eruption: Secondary | ICD-10-CM | POA: Diagnosis not present

## 2017-06-26 DIAGNOSIS — Z7982 Long term (current) use of aspirin: Secondary | ICD-10-CM | POA: Diagnosis not present

## 2017-06-26 DIAGNOSIS — R2689 Other abnormalities of gait and mobility: Secondary | ICD-10-CM | POA: Diagnosis not present

## 2017-06-26 DIAGNOSIS — D649 Anemia, unspecified: Secondary | ICD-10-CM | POA: Diagnosis not present

## 2017-06-26 DIAGNOSIS — S299XXA Unspecified injury of thorax, initial encounter: Secondary | ICD-10-CM | POA: Diagnosis not present

## 2017-06-26 DIAGNOSIS — W010XXA Fall on same level from slipping, tripping and stumbling without subsequent striking against object, initial encounter: Secondary | ICD-10-CM | POA: Diagnosis not present

## 2017-06-26 DIAGNOSIS — D62 Acute posthemorrhagic anemia: Secondary | ICD-10-CM | POA: Diagnosis not present

## 2017-06-26 DIAGNOSIS — W1839XA Other fall on same level, initial encounter: Secondary | ICD-10-CM | POA: Diagnosis not present

## 2017-06-26 DIAGNOSIS — H04123 Dry eye syndrome of bilateral lacrimal glands: Secondary | ICD-10-CM | POA: Diagnosis not present

## 2017-06-26 DIAGNOSIS — T148XXA Other injury of unspecified body region, initial encounter: Secondary | ICD-10-CM | POA: Diagnosis not present

## 2017-06-26 DIAGNOSIS — L309 Dermatitis, unspecified: Secondary | ICD-10-CM | POA: Diagnosis not present

## 2017-06-26 DIAGNOSIS — M8000XA Age-related osteoporosis with current pathological fracture, unspecified site, initial encounter for fracture: Secondary | ICD-10-CM | POA: Diagnosis not present

## 2017-06-26 DIAGNOSIS — W19XXXA Unspecified fall, initial encounter: Secondary | ICD-10-CM | POA: Diagnosis not present

## 2017-06-26 DIAGNOSIS — S7222XA Displaced subtrochanteric fracture of left femur, initial encounter for closed fracture: Secondary | ICD-10-CM | POA: Diagnosis not present

## 2017-06-26 DIAGNOSIS — I10 Essential (primary) hypertension: Secondary | ICD-10-CM | POA: Diagnosis present

## 2017-06-26 DIAGNOSIS — M80052D Age-related osteoporosis with current pathological fracture, left femur, subsequent encounter for fracture with routine healing: Secondary | ICD-10-CM | POA: Diagnosis not present

## 2017-06-26 DIAGNOSIS — S7222XD Displaced subtrochanteric fracture of left femur, subsequent encounter for closed fracture with routine healing: Secondary | ICD-10-CM | POA: Diagnosis not present

## 2017-06-26 DIAGNOSIS — E119 Type 2 diabetes mellitus without complications: Secondary | ICD-10-CM | POA: Diagnosis present

## 2017-06-26 DIAGNOSIS — M810 Age-related osteoporosis without current pathological fracture: Secondary | ICD-10-CM | POA: Diagnosis not present

## 2017-06-26 DIAGNOSIS — Z9181 History of falling: Secondary | ICD-10-CM | POA: Diagnosis not present

## 2017-06-26 DIAGNOSIS — R0902 Hypoxemia: Secondary | ICD-10-CM | POA: Diagnosis not present

## 2017-06-26 DIAGNOSIS — E7800 Pure hypercholesterolemia, unspecified: Secondary | ICD-10-CM | POA: Diagnosis not present

## 2017-06-26 DIAGNOSIS — M79605 Pain in left leg: Secondary | ICD-10-CM | POA: Diagnosis not present

## 2017-06-26 DIAGNOSIS — M6281 Muscle weakness (generalized): Secondary | ICD-10-CM | POA: Diagnosis not present

## 2017-06-26 DIAGNOSIS — I440 Atrioventricular block, first degree: Secondary | ICD-10-CM | POA: Diagnosis not present

## 2017-06-26 DIAGNOSIS — L299 Pruritus, unspecified: Secondary | ICD-10-CM | POA: Diagnosis not present

## 2017-06-26 DIAGNOSIS — W19XXXD Unspecified fall, subsequent encounter: Secondary | ICD-10-CM | POA: Diagnosis not present

## 2017-06-26 DIAGNOSIS — M79604 Pain in right leg: Secondary | ICD-10-CM | POA: Diagnosis not present

## 2017-06-26 DIAGNOSIS — Y9343 Activity, gymnastics: Secondary | ICD-10-CM | POA: Diagnosis not present

## 2017-06-26 DIAGNOSIS — E785 Hyperlipidemia, unspecified: Secondary | ICD-10-CM | POA: Diagnosis present

## 2017-06-26 DIAGNOSIS — W1809XA Striking against other object with subsequent fall, initial encounter: Secondary | ICD-10-CM | POA: Diagnosis not present

## 2017-06-26 DIAGNOSIS — S79922A Unspecified injury of left thigh, initial encounter: Secondary | ICD-10-CM | POA: Diagnosis not present

## 2017-06-26 DIAGNOSIS — S7222XK Displaced subtrochanteric fracture of left femur, subsequent encounter for closed fracture with nonunion: Secondary | ICD-10-CM | POA: Diagnosis not present

## 2017-07-01 DIAGNOSIS — R5381 Other malaise: Secondary | ICD-10-CM | POA: Diagnosis not present

## 2017-07-01 DIAGNOSIS — S7222XD Displaced subtrochanteric fracture of left femur, subsequent encounter for closed fracture with routine healing: Secondary | ICD-10-CM | POA: Diagnosis not present

## 2017-07-01 DIAGNOSIS — W19XXXD Unspecified fall, subsequent encounter: Secondary | ICD-10-CM | POA: Diagnosis not present

## 2017-07-01 DIAGNOSIS — W19XXXA Unspecified fall, initial encounter: Secondary | ICD-10-CM | POA: Diagnosis not present

## 2017-07-01 DIAGNOSIS — M25572 Pain in left ankle and joints of left foot: Secondary | ICD-10-CM | POA: Diagnosis not present

## 2017-07-01 DIAGNOSIS — L299 Pruritus, unspecified: Secondary | ICD-10-CM | POA: Diagnosis not present

## 2017-07-01 DIAGNOSIS — M25552 Pain in left hip: Secondary | ICD-10-CM | POA: Diagnosis not present

## 2017-07-01 DIAGNOSIS — M80052D Age-related osteoporosis with current pathological fracture, left femur, subsequent encounter for fracture with routine healing: Secondary | ICD-10-CM | POA: Diagnosis not present

## 2017-07-01 DIAGNOSIS — I10 Essential (primary) hypertension: Secondary | ICD-10-CM | POA: Diagnosis not present

## 2017-07-01 DIAGNOSIS — Z9181 History of falling: Secondary | ICD-10-CM | POA: Diagnosis not present

## 2017-07-01 DIAGNOSIS — M8000XA Age-related osteoporosis with current pathological fracture, unspecified site, initial encounter for fracture: Secondary | ICD-10-CM | POA: Diagnosis not present

## 2017-07-01 DIAGNOSIS — E785 Hyperlipidemia, unspecified: Secondary | ICD-10-CM | POA: Diagnosis not present

## 2017-07-01 DIAGNOSIS — E7800 Pure hypercholesterolemia, unspecified: Secondary | ICD-10-CM | POA: Diagnosis not present

## 2017-07-01 DIAGNOSIS — H04123 Dry eye syndrome of bilateral lacrimal glands: Secondary | ICD-10-CM | POA: Diagnosis not present

## 2017-07-01 DIAGNOSIS — M810 Age-related osteoporosis without current pathological fracture: Secondary | ICD-10-CM | POA: Diagnosis not present

## 2017-07-01 DIAGNOSIS — M6281 Muscle weakness (generalized): Secondary | ICD-10-CM | POA: Diagnosis not present

## 2017-07-01 DIAGNOSIS — R262 Difficulty in walking, not elsewhere classified: Secondary | ICD-10-CM | POA: Diagnosis not present

## 2017-07-01 DIAGNOSIS — D62 Acute posthemorrhagic anemia: Secondary | ICD-10-CM | POA: Diagnosis not present

## 2017-07-01 DIAGNOSIS — E119 Type 2 diabetes mellitus without complications: Secondary | ICD-10-CM | POA: Diagnosis not present

## 2017-07-01 DIAGNOSIS — S7222XK Displaced subtrochanteric fracture of left femur, subsequent encounter for closed fracture with nonunion: Secondary | ICD-10-CM | POA: Diagnosis not present

## 2017-07-01 DIAGNOSIS — S72302D Unspecified fracture of shaft of left femur, subsequent encounter for closed fracture with routine healing: Secondary | ICD-10-CM | POA: Diagnosis not present

## 2017-07-01 DIAGNOSIS — R2689 Other abnormalities of gait and mobility: Secondary | ICD-10-CM | POA: Diagnosis not present

## 2017-07-01 DIAGNOSIS — L309 Dermatitis, unspecified: Secondary | ICD-10-CM | POA: Diagnosis not present

## 2017-07-01 DIAGNOSIS — Y33XXXD Other specified events, undetermined intent, subsequent encounter: Secondary | ICD-10-CM | POA: Diagnosis not present

## 2017-07-01 MED ORDER — ACETAMINOPHEN 325 MG PO TABS
975.00 | ORAL_TABLET | ORAL | Status: DC
Start: ? — End: 2017-07-01

## 2017-07-01 MED ORDER — NALOXONE HCL 0.4 MG/ML IJ SOLN
0.40 | INTRAMUSCULAR | Status: DC
Start: ? — End: 2017-07-01

## 2017-07-01 MED ORDER — ONDANSETRON HCL 4 MG/2ML IJ SOLN
4.00 | INTRAMUSCULAR | Status: DC
Start: ? — End: 2017-07-01

## 2017-07-01 MED ORDER — CYCLOSPORINE 0.05 % OP EMUL
1.00 | OPHTHALMIC | Status: DC
Start: 2017-07-01 — End: 2017-07-01

## 2017-07-01 MED ORDER — SENNOSIDES-DOCUSATE SODIUM 8.6-50 MG PO TABS
1.00 | ORAL_TABLET | ORAL | Status: DC
Start: 2017-07-01 — End: 2017-07-01

## 2017-07-01 MED ORDER — LANOLIN ALCOHOL WAX
Status: DC
Start: 2017-07-01 — End: 2017-07-01

## 2017-07-01 MED ORDER — SIMETHICONE 80 MG PO CHEW
80.00 | CHEWABLE_TABLET | ORAL | Status: DC
Start: ? — End: 2017-07-01

## 2017-07-01 MED ORDER — CAMPHOR-MENTHOL 0.5-0.5 % EX LOTN
TOPICAL_LOTION | CUTANEOUS | Status: DC
Start: 2017-07-01 — End: 2017-07-01

## 2017-07-01 MED ORDER — MELATONIN 3 MG PO TABS
3.00 | ORAL_TABLET | ORAL | Status: DC
Start: 2017-07-01 — End: 2017-07-01

## 2017-07-01 MED ORDER — INSULIN LISPRO 100 UNIT/ML ~~LOC~~ SOLN
.00 | SUBCUTANEOUS | Status: DC
Start: 2017-07-01 — End: 2017-07-01

## 2017-07-01 MED ORDER — DEXTROSE 50 % IV SOLN
12.50 | INTRAVENOUS | Status: DC
Start: ? — End: 2017-07-01

## 2017-07-01 MED ORDER — SIMVASTATIN 20 MG PO TABS
20.00 | ORAL_TABLET | ORAL | Status: DC
Start: 2017-07-01 — End: 2017-07-01

## 2017-07-01 MED ORDER — TRAMADOL HCL 50 MG PO TABS
25.00 | ORAL_TABLET | ORAL | Status: DC
Start: ? — End: 2017-07-01

## 2017-07-01 MED ORDER — ENOXAPARIN SODIUM 30 MG/0.3ML ~~LOC~~ SOLN
30.00 | SUBCUTANEOUS | Status: DC
Start: 2017-07-02 — End: 2017-07-01

## 2017-07-01 MED ORDER — CALCIUM CARBONATE-VITAMIN D 600-400 MG-UNIT PO TABS
1.00 | ORAL_TABLET | ORAL | Status: DC
Start: 2017-07-02 — End: 2017-07-01

## 2017-07-01 MED ORDER — GLUCAGON HCL RDNA (DIAGNOSTIC) 1 MG IJ SOLR
1.00 | INTRAMUSCULAR | Status: DC
Start: ? — End: 2017-07-01

## 2017-07-08 DIAGNOSIS — L309 Dermatitis, unspecified: Secondary | ICD-10-CM | POA: Diagnosis not present

## 2017-07-08 DIAGNOSIS — L299 Pruritus, unspecified: Secondary | ICD-10-CM | POA: Diagnosis not present

## 2017-07-08 DIAGNOSIS — H04123 Dry eye syndrome of bilateral lacrimal glands: Secondary | ICD-10-CM | POA: Diagnosis not present

## 2017-07-12 DIAGNOSIS — R262 Difficulty in walking, not elsewhere classified: Secondary | ICD-10-CM | POA: Diagnosis not present

## 2017-07-12 DIAGNOSIS — R5381 Other malaise: Secondary | ICD-10-CM | POA: Diagnosis not present

## 2017-07-12 DIAGNOSIS — M6281 Muscle weakness (generalized): Secondary | ICD-10-CM | POA: Diagnosis not present

## 2017-07-12 DIAGNOSIS — M25552 Pain in left hip: Secondary | ICD-10-CM | POA: Diagnosis not present

## 2017-07-13 DIAGNOSIS — S72302D Unspecified fracture of shaft of left femur, subsequent encounter for closed fracture with routine healing: Secondary | ICD-10-CM | POA: Diagnosis not present

## 2017-07-13 DIAGNOSIS — S7222XD Displaced subtrochanteric fracture of left femur, subsequent encounter for closed fracture with routine healing: Secondary | ICD-10-CM | POA: Diagnosis not present

## 2017-07-13 DIAGNOSIS — Y33XXXD Other specified events, undetermined intent, subsequent encounter: Secondary | ICD-10-CM | POA: Diagnosis not present

## 2017-07-13 DIAGNOSIS — M810 Age-related osteoporosis without current pathological fracture: Secondary | ICD-10-CM | POA: Diagnosis not present

## 2017-07-13 DIAGNOSIS — E119 Type 2 diabetes mellitus without complications: Secondary | ICD-10-CM | POA: Diagnosis not present

## 2017-07-13 DIAGNOSIS — I10 Essential (primary) hypertension: Secondary | ICD-10-CM | POA: Diagnosis not present

## 2017-07-14 ENCOUNTER — Telehealth: Payer: Self-pay | Admitting: Family Medicine

## 2017-07-14 NOTE — Telephone Encounter (Signed)
Ok for verbal orders ?

## 2017-07-14 NOTE — Telephone Encounter (Signed)
Left message for Stanton Kidney to call back for verbal orders.  CRM created

## 2017-07-14 NOTE — Telephone Encounter (Signed)
Copied from [place] [id number]. Topic: Inquiry >> Jul 14, 2017 12:10 PM Scherrie Gerlach wrote: Reason for CRM: Stanton Kidney with [facility] states pt is dc'd today from [facility] They need a verbal for home health skilled nursing med management.  Stanton Kidney states they need to see the pt within 48 hours so needs asap.

## 2017-07-15 DIAGNOSIS — E119 Type 2 diabetes mellitus without complications: Secondary | ICD-10-CM | POA: Diagnosis not present

## 2017-07-15 DIAGNOSIS — I10 Essential (primary) hypertension: Secondary | ICD-10-CM | POA: Diagnosis not present

## 2017-07-15 DIAGNOSIS — M810 Age-related osteoporosis without current pathological fracture: Secondary | ICD-10-CM | POA: Diagnosis not present

## 2017-07-15 DIAGNOSIS — S7222XK Displaced subtrochanteric fracture of left femur, subsequent encounter for closed fracture with nonunion: Secondary | ICD-10-CM | POA: Diagnosis not present

## 2017-07-15 DIAGNOSIS — E785 Hyperlipidemia, unspecified: Secondary | ICD-10-CM | POA: Diagnosis not present

## 2017-07-15 DIAGNOSIS — D649 Anemia, unspecified: Secondary | ICD-10-CM | POA: Diagnosis not present

## 2017-07-15 NOTE — Telephone Encounter (Signed)
Copied from [place] [id number]. Topic: Quick Communication - See Telephone Encounter >> Jul 15, 2017  2:47 PM Synthia Innocent wrote: CRM for notification. See Telephone encounter for: 07/15/17. Home Health Calling, pt fell on 06/26/17 had surgery, DC from Rehab on 07/14/17, 152/84 BP today, lisinopril (PRINIVIL,ZESTRIL) 10 MG tablet was stopped in hospital. Start back? Needing Verbal orders for skilled nursing, 1x a week for 3 weeks. Please advise

## 2017-07-16 DIAGNOSIS — S7222XK Displaced subtrochanteric fracture of left femur, subsequent encounter for closed fracture with nonunion: Secondary | ICD-10-CM | POA: Diagnosis not present

## 2017-07-16 DIAGNOSIS — E119 Type 2 diabetes mellitus without complications: Secondary | ICD-10-CM | POA: Diagnosis not present

## 2017-07-16 DIAGNOSIS — E785 Hyperlipidemia, unspecified: Secondary | ICD-10-CM | POA: Diagnosis not present

## 2017-07-16 DIAGNOSIS — I10 Essential (primary) hypertension: Secondary | ICD-10-CM | POA: Diagnosis not present

## 2017-07-16 DIAGNOSIS — M810 Age-related osteoporosis without current pathological fracture: Secondary | ICD-10-CM | POA: Diagnosis not present

## 2017-07-16 DIAGNOSIS — D649 Anemia, unspecified: Secondary | ICD-10-CM | POA: Diagnosis not present

## 2017-07-16 NOTE — Telephone Encounter (Signed)
Left detailed message on machine for [facility] with verbal orders

## 2017-07-16 NOTE — Telephone Encounter (Signed)
Ok to restart lisinopril 10 mg daily.  Rosalia for verbal orders

## 2017-07-19 ENCOUNTER — Telehealth: Payer: Self-pay | Admitting: *Deleted

## 2017-07-19 ENCOUNTER — Telehealth: Payer: Self-pay | Admitting: Family Medicine

## 2017-07-19 NOTE — Telephone Encounter (Signed)
Ok to order PT as requested.

## 2017-07-19 NOTE — Telephone Encounter (Signed)
Copied from [place] [id number]. Topic: Quick Communication - See Telephone Encounter >> Jul 19, 2017  8:29 AM Robina Ade, Helene Kelp D wrote: CRM for notification. See Telephone encounter for: 07/19/17. Evelina Dun fron [facility] called to request PT order for patient as follow: Pt 1X for 1week, 3X for 2 week,1X for 1week. He can be reached at [phone].

## 2017-07-19 NOTE — Telephone Encounter (Signed)
Prior authorization done via cover my meds for estradiol patch 0.075mg  approved until 04/05/18. Pharmacy informed as well.

## 2017-07-20 DIAGNOSIS — E119 Type 2 diabetes mellitus without complications: Secondary | ICD-10-CM | POA: Diagnosis not present

## 2017-07-20 DIAGNOSIS — I10 Essential (primary) hypertension: Secondary | ICD-10-CM | POA: Diagnosis not present

## 2017-07-20 DIAGNOSIS — D649 Anemia, unspecified: Secondary | ICD-10-CM | POA: Diagnosis not present

## 2017-07-20 DIAGNOSIS — E785 Hyperlipidemia, unspecified: Secondary | ICD-10-CM | POA: Diagnosis not present

## 2017-07-20 DIAGNOSIS — S7222XK Displaced subtrochanteric fracture of left femur, subsequent encounter for closed fracture with nonunion: Secondary | ICD-10-CM | POA: Diagnosis not present

## 2017-07-20 DIAGNOSIS — M810 Age-related osteoporosis without current pathological fracture: Secondary | ICD-10-CM | POA: Diagnosis not present

## 2017-07-20 NOTE — Telephone Encounter (Signed)
Left detailed message on machine for patient with verbal orders for PT

## 2017-07-21 DIAGNOSIS — E119 Type 2 diabetes mellitus without complications: Secondary | ICD-10-CM | POA: Diagnosis not present

## 2017-07-21 DIAGNOSIS — I10 Essential (primary) hypertension: Secondary | ICD-10-CM | POA: Diagnosis not present

## 2017-07-21 DIAGNOSIS — S7222XK Displaced subtrochanteric fracture of left femur, subsequent encounter for closed fracture with nonunion: Secondary | ICD-10-CM | POA: Diagnosis not present

## 2017-07-21 DIAGNOSIS — M810 Age-related osteoporosis without current pathological fracture: Secondary | ICD-10-CM | POA: Diagnosis not present

## 2017-07-21 DIAGNOSIS — D649 Anemia, unspecified: Secondary | ICD-10-CM | POA: Diagnosis not present

## 2017-07-21 DIAGNOSIS — E785 Hyperlipidemia, unspecified: Secondary | ICD-10-CM | POA: Diagnosis not present

## 2017-07-23 DIAGNOSIS — E785 Hyperlipidemia, unspecified: Secondary | ICD-10-CM | POA: Diagnosis not present

## 2017-07-23 DIAGNOSIS — S7222XK Displaced subtrochanteric fracture of left femur, subsequent encounter for closed fracture with nonunion: Secondary | ICD-10-CM | POA: Diagnosis not present

## 2017-07-23 DIAGNOSIS — I10 Essential (primary) hypertension: Secondary | ICD-10-CM | POA: Diagnosis not present

## 2017-07-23 DIAGNOSIS — E119 Type 2 diabetes mellitus without complications: Secondary | ICD-10-CM | POA: Diagnosis not present

## 2017-07-23 DIAGNOSIS — D649 Anemia, unspecified: Secondary | ICD-10-CM | POA: Diagnosis not present

## 2017-07-23 DIAGNOSIS — M810 Age-related osteoporosis without current pathological fracture: Secondary | ICD-10-CM | POA: Diagnosis not present

## 2017-07-26 ENCOUNTER — Other Ambulatory Visit: Payer: Self-pay

## 2017-07-26 ENCOUNTER — Encounter: Payer: Self-pay | Admitting: Family Medicine

## 2017-07-26 ENCOUNTER — Ambulatory Visit (INDEPENDENT_AMBULATORY_CARE_PROVIDER_SITE_OTHER): Payer: Medicare Other | Admitting: Family Medicine

## 2017-07-26 VITALS — BP 140/78 | HR 76 | Temp 98.2°F | Resp 16 | Ht 60.0 in | Wt 121.3 lb

## 2017-07-26 DIAGNOSIS — I10 Essential (primary) hypertension: Secondary | ICD-10-CM

## 2017-07-26 DIAGNOSIS — E785 Hyperlipidemia, unspecified: Secondary | ICD-10-CM

## 2017-07-26 DIAGNOSIS — E119 Type 2 diabetes mellitus without complications: Secondary | ICD-10-CM | POA: Diagnosis not present

## 2017-07-26 DIAGNOSIS — S7225XD Nondisplaced subtrochanteric fracture of left femur, subsequent encounter for closed fracture with routine healing: Secondary | ICD-10-CM | POA: Diagnosis not present

## 2017-07-26 NOTE — Progress Notes (Signed)
Subjective:     Patient ID: [name], female   DOB: December 30, 1940, 77 y.o.   MRN: [id number]  HPI Patient seen regarding recent left side trochanteric femur fracture. This occurred on March 23. She was at an event at a gymnasium down in [facility]. They had some type of tarp on the floor but there was laxity and she apparently caught her foot on the tarp and and went down. She states that she felt sharp severe pain left femur and heard a popping sound before she actually hit the ground. She was promptly taken into [facility] for further evaluation and diagnosed with fracture and had surgery that day. Surgery was uneventful. She was discharged 5 days later to rehabilitation center where she stayed until April ninth.  She did well in physical therapy and is still getting some home physical therapy this time. She has a walker which she uses at night when she gets up but she is mostly using a cane at this point. She feels that she is ready to resume driving.  She has type 2 diabetes and blood sugars have remained relatively stable. She was taken off lisinopril during her rehabilitation and hospitalization because her blood pressures were very stable. Post operative hemoglobin 10.0. No dizziness. No fatigue. No dyspnea  Past Medical History:  Diagnosis Date  . Abnormal chest x-ray   . Acute bronchitis   . Cancer ([place]) 2001   skin, nose  . Cataract   . Diabetes mellitus    Borderline/no meds  . Diverticulosis of colon   . DNR (do not resuscitate) 2009  . GERD (gastroesophageal reflux disease)   . Hyperlipidemia   . Hypertension   . Kidney stones    1 time  . Migraines    during menopause  . Osteoporosis 06/2016   T score -2 .7 stable/improved from prior DEXA  . Other diseases of lung, not elsewhere classified   . Pneumothorax on left   . Post-operative nausea and vomiting    1 time  . Skin tag of anus    Past Surgical History:  Procedure Laterality Date  . ABDOMINAL  HYSTERECTOMY  1994   fibroids/ complete  . BUNIONECTOMY  2004   left  . CARPAL TUNNEL RELEASE  2000   right   . CATARACT EXTRACTION     Bil  . FEMUR FRACTURE SURGERY    . LUNG SURGERY  2008   left lung VATS for biopsy and removal of blebs with pleurodesis  . OOPHORECTOMY     BSO  . TONSILLECTOMY     as child    reports that she has never smoked. She has never used smokeless tobacco. She reports that she does not drink alcohol or use drugs. family history includes Cancer in her father, mother, and sister; Diabetes in her sister; Emphysema in her maternal aunt; Hyperlipidemia in her sister; Ovarian cancer in her sister; Pulmonary embolism in her father. No Known Allergies   Review of Systems  Constitutional: Negative for chills and fever.  Respiratory: Negative for shortness of breath.   Cardiovascular: Negative for chest pain.  Gastrointestinal: Negative for abdominal pain.  Genitourinary: Negative for dysuria.  Neurological: Negative for dizziness, weakness and headaches.  Hematological: Negative for adenopathy. Does not bruise/bleed easily.       Objective:   Physical Exam  Constitutional: She is oriented to person, place, and time. She appears well-developed and well-nourished.  Neck: Neck supple. No thyromegaly present.  Cardiovascular: Normal rate  and regular rhythm.  Pulmonary/Chest: Effort normal and breath sounds normal. No respiratory distress. She has no wheezes. She has no rales.  Musculoskeletal: She exhibits no edema.  Neurological: She is alert and oriented to person, place, and time.       Assessment:     #1 recent left side trochanteric femur fracture healing well  #2 history of hypertension. Borderline high blood pressure today  #3 history of hyperlipidemia  #4 type 2 diabetes    Plan:     -Continue with home physical therapy -She has follow-up with orthopedic surgeon in early June -We recommend she go ahead and start back lisinopril 10 mg  daily as blood pressure has been climbing up into mild elevation range  Eulas Post MD [facility] at [facility]

## 2017-07-26 NOTE — Patient Instructions (Signed)
Get back on the Lisinopril 10 mg daily.

## 2017-07-27 DIAGNOSIS — I10 Essential (primary) hypertension: Secondary | ICD-10-CM | POA: Diagnosis not present

## 2017-07-27 DIAGNOSIS — E785 Hyperlipidemia, unspecified: Secondary | ICD-10-CM | POA: Diagnosis not present

## 2017-07-27 DIAGNOSIS — D649 Anemia, unspecified: Secondary | ICD-10-CM | POA: Diagnosis not present

## 2017-07-27 DIAGNOSIS — E119 Type 2 diabetes mellitus without complications: Secondary | ICD-10-CM | POA: Diagnosis not present

## 2017-07-27 DIAGNOSIS — S7222XK Displaced subtrochanteric fracture of left femur, subsequent encounter for closed fracture with nonunion: Secondary | ICD-10-CM | POA: Diagnosis not present

## 2017-07-27 DIAGNOSIS — M810 Age-related osteoporosis without current pathological fracture: Secondary | ICD-10-CM | POA: Diagnosis not present

## 2017-07-28 ENCOUNTER — Other Ambulatory Visit: Payer: Self-pay | Admitting: Gynecology

## 2017-07-28 DIAGNOSIS — M810 Age-related osteoporosis without current pathological fracture: Secondary | ICD-10-CM | POA: Diagnosis not present

## 2017-07-28 DIAGNOSIS — E785 Hyperlipidemia, unspecified: Secondary | ICD-10-CM | POA: Diagnosis not present

## 2017-07-28 DIAGNOSIS — E119 Type 2 diabetes mellitus without complications: Secondary | ICD-10-CM | POA: Diagnosis not present

## 2017-07-28 DIAGNOSIS — S7222XK Displaced subtrochanteric fracture of left femur, subsequent encounter for closed fracture with nonunion: Secondary | ICD-10-CM | POA: Diagnosis not present

## 2017-07-28 DIAGNOSIS — Z1231 Encounter for screening mammogram for malignant neoplasm of breast: Secondary | ICD-10-CM

## 2017-07-28 DIAGNOSIS — D649 Anemia, unspecified: Secondary | ICD-10-CM | POA: Diagnosis not present

## 2017-07-28 DIAGNOSIS — I10 Essential (primary) hypertension: Secondary | ICD-10-CM | POA: Diagnosis not present

## 2017-07-30 DIAGNOSIS — D649 Anemia, unspecified: Secondary | ICD-10-CM | POA: Diagnosis not present

## 2017-07-30 DIAGNOSIS — M810 Age-related osteoporosis without current pathological fracture: Secondary | ICD-10-CM | POA: Diagnosis not present

## 2017-07-30 DIAGNOSIS — E785 Hyperlipidemia, unspecified: Secondary | ICD-10-CM | POA: Diagnosis not present

## 2017-07-30 DIAGNOSIS — E119 Type 2 diabetes mellitus without complications: Secondary | ICD-10-CM | POA: Diagnosis not present

## 2017-07-30 DIAGNOSIS — I10 Essential (primary) hypertension: Secondary | ICD-10-CM | POA: Diagnosis not present

## 2017-07-30 DIAGNOSIS — S7222XK Displaced subtrochanteric fracture of left femur, subsequent encounter for closed fracture with nonunion: Secondary | ICD-10-CM | POA: Diagnosis not present

## 2017-08-23 DIAGNOSIS — D1801 Hemangioma of skin and subcutaneous tissue: Secondary | ICD-10-CM | POA: Diagnosis not present

## 2017-08-23 DIAGNOSIS — D225 Melanocytic nevi of trunk: Secondary | ICD-10-CM | POA: Diagnosis not present

## 2017-08-23 DIAGNOSIS — L821 Other seborrheic keratosis: Secondary | ICD-10-CM | POA: Diagnosis not present

## 2017-08-23 DIAGNOSIS — Z85828 Personal history of other malignant neoplasm of skin: Secondary | ICD-10-CM | POA: Diagnosis not present

## 2017-09-06 ENCOUNTER — Ambulatory Visit
Admission: RE | Admit: 2017-09-06 | Discharge: 2017-09-06 | Disposition: A | Discharge status: Home / Self Care | Payer: Medicare Other | Source: Ambulatory Visit | Attending: Gynecology | Admitting: Gynecology

## 2017-09-06 DIAGNOSIS — Z1231 Encounter for screening mammogram for malignant neoplasm of breast: Secondary | ICD-10-CM | POA: Diagnosis not present

## 2017-09-14 DIAGNOSIS — S7222XD Displaced subtrochanteric fracture of left femur, subsequent encounter for closed fracture with routine healing: Secondary | ICD-10-CM | POA: Diagnosis not present

## 2017-09-14 DIAGNOSIS — Y33XXXD Other specified events, undetermined intent, subsequent encounter: Secondary | ICD-10-CM | POA: Diagnosis not present

## 2017-10-26 ENCOUNTER — Ambulatory Visit (INDEPENDENT_AMBULATORY_CARE_PROVIDER_SITE_OTHER): Payer: Medicare Other | Admitting: Family Medicine

## 2017-10-26 ENCOUNTER — Encounter: Payer: Self-pay | Admitting: Family Medicine

## 2017-10-26 VITALS — BP 102/70 | HR 69 | Temp 98.0°F | Wt 120.7 lb

## 2017-10-26 DIAGNOSIS — E785 Hyperlipidemia, unspecified: Secondary | ICD-10-CM | POA: Diagnosis not present

## 2017-10-26 DIAGNOSIS — E119 Type 2 diabetes mellitus without complications: Secondary | ICD-10-CM

## 2017-10-26 DIAGNOSIS — I10 Essential (primary) hypertension: Secondary | ICD-10-CM | POA: Diagnosis not present

## 2017-10-26 LAB — POCT GLYCOSYLATED HEMOGLOBIN (HGB A1C): HEMOGLOBIN A1C: 6.2 % — AB (ref 4.0–5.6)

## 2017-10-26 NOTE — Progress Notes (Signed)
Subjective:     Patient ID: [name], female   DOB: [birth year], 77 y.o.   MRN: [id number]  HPI Patient seen for medical follow-up. She has type 2 diabetes, hyperlipidemia, hypertension. Still recovering from recent left femoral fracture. Still has some pain but overall improved. She has increased her ambulation. Ambulating with a cane for additional support.  Type 2 diabetes. Last A1c 6.6% and improved today to 6.2%. Has had some recent poor compliance with diet. Weight stable. She remains on lisinopril for hypertension and simvastatin for hyperlipidemia. Recent labs were stable. Blood pressure stable. No orthostatic symptoms. No dizziness. No chest pains.  Past Medical History:  Diagnosis Date  . Abnormal chest x-ray   . Acute bronchitis   . Cancer ([place]) 2001   skin, nose  . Cataract   . Diabetes mellitus    Borderline/no meds  . Diverticulosis of colon   . DNR (do not resuscitate) 2009  . GERD (gastroesophageal reflux disease)   . Hyperlipidemia   . Hypertension   . Kidney stones    1 time  . Migraines    during menopause  . Osteoporosis 06/2016   T score -2 .7 stable/improved from prior DEXA  . Other diseases of lung, not elsewhere classified   . Pneumothorax on left   . Post-operative nausea and vomiting    1 time  . Skin tag of anus    Past Surgical History:  Procedure Laterality Date  . ABDOMINAL HYSTERECTOMY  1994   fibroids/ complete  . BUNIONECTOMY  2004   left  . CARPAL TUNNEL RELEASE  2000   right   . CATARACT EXTRACTION     Bil  . FEMUR FRACTURE SURGERY    . LUNG SURGERY  2008   left lung VATS for biopsy and removal of blebs with pleurodesis  . OOPHORECTOMY     BSO  . TONSILLECTOMY     as child    reports that she has never smoked. She has never used smokeless tobacco. She reports that she does not drink alcohol or use drugs. family history includes Cancer in her father, mother, and sister; Diabetes in her sister; Emphysema in her maternal aunt;  Hyperlipidemia in her sister; Ovarian cancer in her sister; Pulmonary embolism in her father. No Known Allergies   Review of Systems  Constitutional: Negative for fatigue.  Eyes: Negative for visual disturbance.  Respiratory: Negative for cough, chest tightness, shortness of breath and wheezing.   Cardiovascular: Negative for chest pain, palpitations and leg swelling.  Endocrine: Negative for polydipsia and polyuria.  Neurological: Negative for dizziness, seizures, syncope, weakness, light-headedness and headaches.       Objective:   Physical Exam  Constitutional: She appears well-developed and well-nourished.  Eyes: Pupils are equal, round, and reactive to light.  Neck: Neck supple. No JVD present. No thyromegaly present.  Cardiovascular: Normal rate and regular rhythm. Exam reveals no gallop.  Pulmonary/Chest: Effort normal and breath sounds normal. No respiratory distress. She has no wheezes. She has no rales.  Musculoskeletal: She exhibits no edema.  Neurological: She is alert.       Assessment:     #1 type 2 diabetes well controlled with A1c 6.2%  #2 hypertension stable and at goal  #3 hyperlipidemia stable on simvastatin    Plan:     -Continue current medications. Hopefully she can gradually increase her activities over the next several months as recovers from left hip/femur surgery. -Routine follow-up in 6 months and sooner as  needed  Eulas Post MD [facility] at [facility]

## 2017-12-01 DIAGNOSIS — Z23 Encounter for immunization: Secondary | ICD-10-CM | POA: Diagnosis not present

## 2017-12-21 DIAGNOSIS — Y33XXXD Other specified events, undetermined intent, subsequent encounter: Secondary | ICD-10-CM | POA: Diagnosis not present

## 2017-12-21 DIAGNOSIS — S7222XD Displaced subtrochanteric fracture of left femur, subsequent encounter for closed fracture with routine healing: Secondary | ICD-10-CM | POA: Diagnosis not present

## 2017-12-28 ENCOUNTER — Telehealth: Payer: Self-pay | Admitting: Family Medicine

## 2017-12-28 NOTE — Telephone Encounter (Signed)
Calcium 1200 mg/day and vitamin D 800 to 1000 international units/day

## 2017-12-28 NOTE — Telephone Encounter (Signed)
Copied from [place] [id number]. Topic: Quick Communication - See Telephone Encounter >> Dec 28, 2017  2:53 PM Sheran Luz wrote: CRM for notification. See Telephone encounter for: 12/28/17.  Pt called inquiring the appropriate dosage of calcium and vitamin D that she should be taking. Pt is requesting a call back from Dr. Anastasio Auerbach nurse to discuss/advise.

## 2017-12-28 NOTE — Telephone Encounter (Signed)
Called patient and gave her the message from Dr. Elease Hashimoto and she verbalized an understanding.

## 2017-12-28 NOTE — Telephone Encounter (Signed)
Please see message. Please advise on dosage for patient and I will call her back. Thank you!

## 2018-03-08 DIAGNOSIS — D485 Neoplasm of uncertain behavior of skin: Secondary | ICD-10-CM | POA: Diagnosis not present

## 2018-03-08 DIAGNOSIS — L570 Actinic keratosis: Secondary | ICD-10-CM | POA: Diagnosis not present

## 2018-03-08 DIAGNOSIS — L821 Other seborrheic keratosis: Secondary | ICD-10-CM | POA: Diagnosis not present

## 2018-03-08 DIAGNOSIS — C44722 Squamous cell carcinoma of skin of right lower limb, including hip: Secondary | ICD-10-CM | POA: Diagnosis not present

## 2018-03-08 DIAGNOSIS — Z85828 Personal history of other malignant neoplasm of skin: Secondary | ICD-10-CM | POA: Diagnosis not present

## 2018-03-11 ENCOUNTER — Telehealth: Payer: Self-pay

## 2018-03-11 NOTE — Telephone Encounter (Signed)
Copied from [place] [id number]. Topic: General - Other >> Mar 11, 2018  4:07 PM Yvette Rack wrote: Reason for CRM: Rose with [employer] called to see if fax from 03/09/18 was received. Rose stated she will fax the request again and they will call back on Tuesday 03/15/18 to verify it was received. Cb# [phone]

## 2018-03-11 NOTE — Telephone Encounter (Signed)
Might want to make sure this is not one of those scams....Marland KitchenMarland Kitchen

## 2018-03-15 NOTE — Telephone Encounter (Signed)
Rose called to f/u on RX request that was faxed 12/4. Please advise.

## 2018-03-16 NOTE — Telephone Encounter (Signed)
This was completed and faxed yesterday.

## 2018-03-28 DIAGNOSIS — H10023 Other mucopurulent conjunctivitis, bilateral: Secondary | ICD-10-CM | POA: Diagnosis not present

## 2018-03-28 DIAGNOSIS — D0471 Carcinoma in situ of skin of right lower limb, including hip: Secondary | ICD-10-CM | POA: Diagnosis not present

## 2018-04-29 ENCOUNTER — Other Ambulatory Visit: Payer: Self-pay

## 2018-04-29 ENCOUNTER — Ambulatory Visit (INDEPENDENT_AMBULATORY_CARE_PROVIDER_SITE_OTHER): Payer: Medicare Other | Admitting: Family Medicine

## 2018-04-29 ENCOUNTER — Encounter: Payer: Self-pay | Admitting: Family Medicine

## 2018-04-29 VITALS — BP 118/72 | HR 68 | Temp 97.7°F | Ht 60.0 in | Wt 122.1 lb

## 2018-04-29 DIAGNOSIS — I10 Essential (primary) hypertension: Secondary | ICD-10-CM | POA: Diagnosis not present

## 2018-04-29 DIAGNOSIS — E119 Type 2 diabetes mellitus without complications: Secondary | ICD-10-CM | POA: Diagnosis not present

## 2018-04-29 DIAGNOSIS — E785 Hyperlipidemia, unspecified: Secondary | ICD-10-CM | POA: Diagnosis not present

## 2018-04-29 LAB — LIPID PANEL
Cholesterol: 152 mg/dL (ref 0–200)
HDL: 61.9 mg/dL (ref >39.00)
LDL Cholesterol: 63 mg/dL (ref 0–99)
NONHDL: 90.48
Total CHOL/HDL Ratio: 2
Triglycerides: 136 mg/dL (ref 0.0–149.0)
VLDL: 27.2 mg/dL (ref 0.0–40.0)

## 2018-04-29 LAB — BASIC METABOLIC PANEL
BUN: 17 mg/dL (ref 6–23)
CHLORIDE: 104 meq/L (ref 96–112)
CO2: 30 meq/L (ref 19–32)
Calcium: 10.1 mg/dL (ref 8.4–10.5)
Creatinine, Ser: 0.7 mg/dL (ref 0.40–1.20)
GFR: 81.03 mL/min (ref >60.00)
GLUCOSE: 126 mg/dL — AB (ref 70–99)
POTASSIUM: 5 meq/L (ref 3.5–5.1)
SODIUM: 143 meq/L (ref 135–145)

## 2018-04-29 LAB — HEPATIC FUNCTION PANEL
ALT: 18 U/L (ref 0–35)
AST: 16 U/L (ref 0–37)
Albumin: 4.6 g/dL (ref 3.5–5.2)
Alkaline Phosphatase: 42 U/L (ref 39–117)
Bilirubin, Direct: 0.1 mg/dL (ref 0.0–0.3)
Total Bilirubin: 0.8 mg/dL (ref 0.2–1.2)
Total Protein: 6.6 g/dL (ref 6.0–8.3)

## 2018-04-29 LAB — POCT GLYCOSYLATED HEMOGLOBIN (HGB A1C): Hemoglobin A1C: 5.8 % — AB (ref 4.0–5.6)

## 2018-04-29 MED ORDER — SIMVASTATIN 20 MG PO TABS: 20.0000 mg | ORAL_TABLET | Freq: Every day | ORAL | 3 refills | Status: DC

## 2018-04-29 MED ORDER — LISINOPRIL 10 MG PO TABS: 10.0000 mg | ORAL_TABLET | Freq: Every day | ORAL | 3 refills | Status: DC

## 2018-04-29 NOTE — Progress Notes (Signed)
Subjective:     Patient ID: [name], female   DOB: [birth year], 78 y.o.   MRN: [id number]  HPI Patient is seen for medical follow-up.  She has type 2 diabetes, hyperlipidemia, hypertension.  She had femur fracture from spontaneous fracture last spring and has recovered well since then.  She is not on any further falls.  Her immunizations are up-to-date with exception she is not had the new shingles vaccine.  She would like to consider that.  She has had her flu vaccine.  Medications reviewed.  Denies any side effects from medications.  Compliant with therapy.  Past Medical History:  Diagnosis Date  . Abnormal chest x-ray   . Acute bronchitis   . Cancer ([place]) 2001   skin, nose  . Cataract   . Diabetes mellitus    Borderline/no meds  . Diverticulosis of colon   . DNR (do not resuscitate) 2009  . GERD (gastroesophageal reflux disease)   . Hyperlipidemia   . Hypertension   . Kidney stones    1 time  . Migraines    during menopause  . Osteoporosis 06/2016   T score -2 .7 stable/improved from prior DEXA  . Other diseases of lung, not elsewhere classified   . Pneumothorax on left   . Post-operative nausea and vomiting    1 time  . Skin tag of anus    Past Surgical History:  Procedure Laterality Date  . ABDOMINAL HYSTERECTOMY  1994   fibroids/ complete  . BUNIONECTOMY  2004   left  . CARPAL TUNNEL RELEASE  2000   right   . CATARACT EXTRACTION     Bil  . FEMUR FRACTURE SURGERY    . LUNG SURGERY  2008   left lung VATS for biopsy and removal of blebs with pleurodesis  . OOPHORECTOMY     BSO  . TONSILLECTOMY     as child    reports that she has never smoked. She has never used smokeless tobacco. She reports that she does not drink alcohol or use drugs. family history includes Cancer in her father, mother, and sister; Diabetes in her sister; Emphysema in her maternal aunt; Hyperlipidemia in her sister; Ovarian cancer in her sister; Pulmonary embolism in her father. No Known  Allergies   Review of Systems  Constitutional: Negative for fatigue.  Eyes: Negative for visual disturbance.  Respiratory: Negative for cough, chest tightness, shortness of breath and wheezing.   Cardiovascular: Negative for chest pain, palpitations and leg swelling.  Endocrine: Negative for polydipsia and polyuria.  Neurological: Negative for dizziness, seizures, syncope, weakness, light-headedness and headaches.       Objective:   Physical Exam Constitutional:      Appearance: She is well-developed.  Eyes:     Pupils: Pupils are equal, round, and reactive to light.  Neck:     Musculoskeletal: Neck supple.     Thyroid: No thyromegaly.     Vascular: No JVD.  Cardiovascular:     Rate and Rhythm: Normal rate and regular rhythm.     Heart sounds: No gallop.   Pulmonary:     Effort: Pulmonary effort is normal. No respiratory distress.     Breath sounds: Normal breath sounds. No wheezing or rales.  Neurological:     Mental Status: She is alert.        Assessment:     #1 hypertension stable and at goal  #2   Type 2 diabetes well-controlled with hemoglobin A1c 5.8%  #3  Dyslipidemia    Plan:     -Patient will check with pharmacy regarding shingles vaccine -Refill lisinopril and simvastatin for 1 year -Check labs today with basic metabolic panel, hepatic panel, lipid panel  Eulas Post MD [facility] at [facility]

## 2018-05-25 DIAGNOSIS — E119 Type 2 diabetes mellitus without complications: Secondary | ICD-10-CM | POA: Diagnosis not present

## 2018-05-25 LAB — HM DIABETES EYE EXAM

## 2018-06-14 ENCOUNTER — Ambulatory Visit (INDEPENDENT_AMBULATORY_CARE_PROVIDER_SITE_OTHER): Payer: Medicare Other | Admitting: Gynecology

## 2018-06-14 ENCOUNTER — Encounter: Payer: Self-pay | Admitting: Gynecology

## 2018-06-14 VITALS — BP 118/74 | Ht 60.0 in | Wt 121.0 lb

## 2018-06-14 DIAGNOSIS — N952 Postmenopausal atrophic vaginitis: Secondary | ICD-10-CM

## 2018-06-14 DIAGNOSIS — Z01419 Encounter for gynecological examination (general) (routine) without abnormal findings: Secondary | ICD-10-CM | POA: Diagnosis not present

## 2018-06-14 DIAGNOSIS — M810 Age-related osteoporosis without current pathological fracture: Secondary | ICD-10-CM

## 2018-06-14 MED ORDER — ESTRADIOL 0.075 MG/24HR TD PTWK: 0.0750 mg | MEDICATED_PATCH | TRANSDERMAL | 12 refills | Status: DC

## 2018-06-14 NOTE — Progress Notes (Signed)
    CHONG JANUARY 1941/01/02 [id number]        78 y.o.  G0P0 for breast and pelvic exam.  Several issues noted below.  Past medical history,surgical history, problem list, medications, allergies, family history and social history were all reviewed and documented as reviewed in the EPIC chart.  ROS:  Performed with pertinent positives and negatives included in the history, assessment and plan.   Additional significant findings : None   Exam: [name] assistant Vitals:   06/14/18 1102  BP: 118/74  Weight: 121 lb (54.9 kg)  Height: 5' (1.524 m)   Body mass index is 23.63 kg/m.  General appearance:  Normal affect, orientation and appearance. Skin: Grossly normal HEENT: Without gross lesions.  No cervical or supraclavicular adenopathy. Thyroid normal.  Lungs:  Clear without wheezing, rales or rhonchi Cardiac: RR, without RMG Abdominal:  Soft, nontender, without masses, guarding, rebound, organomegaly or hernia Breasts:  Examined lying and sitting without masses, retractions, discharge or axillary adenopathy. Pelvic:  Ext, BUS, Vagina: With atrophic changes  Adnexa: Without masses or tenderness    Anus and perineum: Normal   Rectovaginal: Normal sphincter tone without palpated masses or tenderness.    Assessment/Plan:  78 y.o. G0P0 female for breast and pelvic exam  1. Postmenopausal.  Status post TAH BSO for leiomyoma.  Using one half of a Climara 0.075 patch weekly.  Doing well with this and wants to continue.  We have reviewed the risks versus benefits to include increased risk of thrombosis such as stroke heart attack DVT in the breast cancer issue.  At this point the patient wants to continue and I refilled her x1 year. 2. Osteoporosis.  DEXA 2018 T score -2.7 stable from prior DEXA.  Had been on Fosamax for 3 years but discontinued in 2018.  She subsequently developed a femur fracture 06/2017 that they attributed to bisphosphate's.  She has a bone density scheduled in the next  several weeks.  She will follow-up for this and then we will discuss options.  Possible referral to Dr Cruzita Lederer discussed. 3. Colonoscopy 2015.  Repeat at their recommended interval. 4. Mammography 09/2017.  Continue with annual mammography when due.  Breast exam normal today. 5. Pap smear 2012.  No Pap smear done today.  No history of abnormal Pap smears.  We both agree to stop screening per current screening guidelines based on age and hysterectomy history. 6. Health maintenance.  No routine lab work done as patient does this elsewhere.  Follow-up for bone density and discussion afterwards.  Follow-up in 1 year for annual exam.   Anastasio Auerbach MD, 11:37 AM 06/14/2018

## 2018-06-14 NOTE — Patient Instructions (Signed)
Follow-up after the bone density for discussion of treatment options.  Follow-up in 1 year for annual exam

## 2018-07-14 ENCOUNTER — Encounter: Payer: Self-pay | Admitting: Family Medicine

## 2018-08-01 ENCOUNTER — Other Ambulatory Visit: Payer: Self-pay | Admitting: Gynecology

## 2018-08-01 DIAGNOSIS — Z1231 Encounter for screening mammogram for malignant neoplasm of breast: Secondary | ICD-10-CM

## 2018-08-19 ENCOUNTER — Other Ambulatory Visit: Payer: Self-pay

## 2018-08-19 ENCOUNTER — Ambulatory Visit (HOSPITAL_COMMUNITY)
Admission: EM | Admit: 2018-08-19 | Discharge: 2018-08-19 | Disposition: A | Discharge status: Home / Self Care | Payer: Medicare Other | Attending: Family Medicine | Admitting: Family Medicine

## 2018-08-19 ENCOUNTER — Ambulatory Visit (HOSPITAL_COMMUNITY): Payer: Medicare Other

## 2018-08-19 ENCOUNTER — Ambulatory Visit (INDEPENDENT_AMBULATORY_CARE_PROVIDER_SITE_OTHER): Payer: Medicare Other

## 2018-08-19 ENCOUNTER — Encounter (HOSPITAL_COMMUNITY): Payer: Self-pay

## 2018-08-19 DIAGNOSIS — R52 Pain, unspecified: Secondary | ICD-10-CM | POA: Diagnosis not present

## 2018-08-19 DIAGNOSIS — M7061 Trochanteric bursitis, right hip: Secondary | ICD-10-CM

## 2018-08-19 DIAGNOSIS — M79651 Pain in right thigh: Secondary | ICD-10-CM | POA: Diagnosis not present

## 2018-08-19 DIAGNOSIS — M25551 Pain in right hip: Secondary | ICD-10-CM | POA: Diagnosis not present

## 2018-08-19 DIAGNOSIS — Z8739 Personal history of other diseases of the musculoskeletal system and connective tissue: Secondary | ICD-10-CM

## 2018-08-19 NOTE — Discharge Instructions (Signed)
Consider aleve 2 tabs morning and night with food for a few days Ice to bursa when it hurts Follow up with your PCP

## 2018-08-19 NOTE — ED Triage Notes (Signed)
Pt c/o rt upper leg pain for over a month, denies injury. States she fx'd her lt leg last year in same area with no injury.

## 2018-08-19 NOTE — ED Provider Notes (Signed)
[place]    CSN: [id number] Arrival date & time: 08/19/18  1930     History   Chief Complaint Chief Complaint  Patient presents with  . Leg Pain    HPI KYLII ENNIS is a 78 y.o. female.   HPI  78 year old woman who lives alone.  History of osteoporosis.  She states that she was on "bone building medication" and she had a fracture of her left femur.  It is her believe that  the femur fracture was because of her medications so she was taken off of it.  (Alendronate from care everywhere chart) she does not member the name of the medicine.  She was told that it is possible for the other femur fracture similarly.  This worries her.  She has not decreased activity level because of this.  She states that over the last month or more she has noticed that she is having a deep pain in her left femur.  She thinks it is similar to the way her right leg felt before it fractured. Change in activity.  No change in medications.  No trauma or fall.  Past Medical History:  Diagnosis Date  . Broken femur ([place])   . Cancer ([place]) 2001   skin, nose  . Cataract   . Diabetes mellitus    Borderline/no meds  . Diverticulosis of colon   . DNR (do not resuscitate) 2009  . GERD (gastroesophageal reflux disease)   . Hyperlipidemia   . Hypertension   . Kidney stones    1 time  . Migraines    during menopause  . Osteoporosis 06/2016   T score -2 .7 stable/improved from prior DEXA  . Pneumothorax on left     Patient Active Problem List   Diagnosis Date Noted  . Type 2 diabetes mellitus, controlled ([place]) 06/19/2010  . Essential hypertension 06/19/2010  . Osteoporosis 04/28/2010  . PHARYNGITIS 08/01/2008  . DYSPNEA 07/30/2008  . Hyperlipidemia 05/31/2008  . MIGRAINE HEADACHE 09/19/2007  . GERD 09/19/2007  . DIVERTICULOSIS, COLON 09/19/2007  . 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 SPONTANEOUS PNEUMOTHORAX 05/16/2007    Past Surgical History:  Procedure  Laterality Date  . ABDOMINAL HYSTERECTOMY  1994   fibroids/ complete  . BUNIONECTOMY  2004   left  . CARPAL TUNNEL RELEASE  2000   right   . CATARACT EXTRACTION     Bil  . FEMUR FRACTURE SURGERY    . LUNG SURGERY  2008   left lung VATS for biopsy and removal of blebs with pleurodesis  . OOPHORECTOMY     BSO  . TONSILLECTOMY     as child    OB History    Gravida  0   Para      Term      Preterm      AB      Living        SAB      TAB      Ectopic      Multiple      Live Births               Home Medications    Prior to Admission medications   Medication Sig Start Date End Date Taking? Authorizing Provider  aspirin EC 81 MG tablet Take 81 mg by mouth daily.    [provider]  Calcium Citrate-Vitamin D (CALCIUM CITRATE + D3 PO) 800mg  calcium 1000 IU Vit. D. Take by mouth  daily.    [provider]  Chromium-Cinnamon (CINNAMON PLUS CHROMIUM PO) 2000 mg cinnamon 200 mcg Chromium Take by mouth daily.    [provider]  cycloSPORINE (RESTASIS) 0.05 % ophthalmic emulsion Place 1 drop into both eyes 2 (two) times daily.    [provider]  estradiol (CLIMARA - DOSED IN MG/24 HR) 0.075 mg/24hr patch Place 1 patch (0.075 mg total) onto the skin once a week. 06/14/18   Fontaine, Belinda Block, MD  glucose blood test strip Use as instructed 04/28/12   Burchette, Alinda Sierras, MD  lisinopril (PRINIVIL,ZESTRIL) 10 MG tablet Take 1 tablet (10 mg total) by mouth daily. Per Dr Elease Hashimoto 04/29/18   Burchette, Alinda Sierras, MD  Multiple Vitamin (MULTIVITAMIN PO) Take 1 tablet by mouth daily.     [provider]  OVER THE COUNTER MEDICATION Omega - 3 1040 mg take one tab daily    [provider]  simvastatin (ZOCOR) 20 MG tablet Take 1 tablet (20 mg total) by mouth daily. 04/29/18   Burchette, Alinda Sierras, MD  Ultilet Classic Lancets MISC Testing BS once daily 04/28/12   Burchette, Alinda Sierras, MD    Family History Family History  Problem  Relation Age of Onset  . Cancer Mother        bone  . Diabetes Sister   . Hyperlipidemia Sister   . Ovarian cancer Sister   . Pulmonary embolism Father   . Cancer Father        Facial  . Cancer Sister        melanoma  . Emphysema Maternal Aunt   . Breast cancer Neg Hx     Social History Social History   Tobacco Use  . Smoking status: Never Smoker  . Smokeless tobacco: Never Used  Substance Use Topics  . Alcohol use: No    Alcohol/week: 0.0 standard drinks  . Drug use: No     Allergies   Patient has no known allergies.   Review of Systems Review of Systems  Constitutional: Negative for chills and fever.  HENT: Negative for ear pain and sore throat.   Eyes: Negative for pain and visual disturbance.  Respiratory: Negative for cough and shortness of breath.   Cardiovascular: Negative for chest pain and palpitations.  Gastrointestinal: Negative for abdominal pain and vomiting.  Genitourinary: Negative for dysuria and hematuria.  Musculoskeletal: Positive for gait problem. Negative for arthralgias and back pain.       Pain  Skin: Negative for color change and rash.  Neurological: Negative for seizures and syncope.  All other systems reviewed and are negative.    Physical Exam Triage Vital Signs ED Triage Vitals  Enc Vitals Group     BP 08/19/18 1943 137/74     Pulse Rate 08/19/18 1943 86     Resp 08/19/18 1943 18     Temp 08/19/18 1943 98.7 F (37.1 C)     Temp Source 08/19/18 1943 Oral     SpO2 08/19/18 1943 97 %     Weight --      Height --      Head Circumference --      Peak Flow --      Pain Score 08/19/18 1944 2     Pain Loc --      Pain Edu? --      Excl. in [place]? --    No data found.  Updated Vital Signs BP 137/74 (BP Location: Left Arm)   Pulse 86  Temp 98.7 F (37.1 C) (Oral)   Resp 18   SpO2 97%      Physical Exam Constitutional:      General: She is not in acute distress.    Appearance: She is well-developed and normal weight.   HENT:     Head: Normocephalic and atraumatic.  Eyes:     Conjunctiva/sclera: Conjunctivae normal.     Pupils: Pupils are equal, round, and reactive to light.  Neck:     Musculoskeletal: Normal range of motion.  Cardiovascular:     Rate and Rhythm: Normal rate.  Pulmonary:     Effort: Pulmonary effort is normal. No respiratory distress.  Abdominal:     General: There is no distension.     Palpations: Abdomen is soft.  Musculoskeletal: Normal range of motion.     Comments: Minimally antalgic gait.  There is tenderness directly palpating the right greater trochanter.  Hip range of motion is good.  No bony tenderness to palpation of the thigh or deep palpation of femur.  Full range of motion of hip and knee  Skin:    General: Skin is warm and dry.  Neurological:     Mental Status: She is alert.      UC Treatments / Results  Labs (all labs ordered are listed, but only abnormal results are displayed) Labs Reviewed - No data to display  EKG None  Radiology No results found.  Procedures Procedures (including critical care time)  Medications Ordered in UC Medications - No data to display  Initial Impression / Assessment and Plan / UC Course  I have reviewed the triage vital signs and the nursing notes.  Pertinent labs & imaging results that were available during my care of the patient were reviewed by me and considered in my medical decision making (see chart for details).     I discussed with the patient that although her x-ray was negative, a stress fracture of a femur might not show on initial films.  She needs to follow-up with her physician for pain persists.  With most of her pain over the trochanteric bursa, I believe she has bursitis.  Treatment of bursitis was discussed with patient. Final Clinical Impressions(s) / UC Diagnoses   Final diagnoses:  Pain  Pain in right hip  History of osteoporosis  Trochanteric bursitis of right hip     Discharge Instructions      Consider aleve 2 tabs morning and night with food for a few days Ice to bursa when it hurts Follow up with your PCP    ED Prescriptions    None     Controlled Substance Prescriptions [place] Controlled Substance Registry consulted? Not Applicable   Raylene Everts, MD 08/22/18 1254

## 2018-09-22 ENCOUNTER — Other Ambulatory Visit: Payer: Self-pay

## 2018-09-22 ENCOUNTER — Encounter (INDEPENDENT_AMBULATORY_CARE_PROVIDER_SITE_OTHER): Payer: Medicare Other

## 2018-09-22 DIAGNOSIS — M810 Age-related osteoporosis without current pathological fracture: Secondary | ICD-10-CM

## 2018-09-22 DIAGNOSIS — M818 Other osteoporosis without current pathological fracture: Secondary | ICD-10-CM

## 2018-09-22 DIAGNOSIS — Z780 Asymptomatic menopausal state: Secondary | ICD-10-CM | POA: Diagnosis not present

## 2018-09-23 ENCOUNTER — Other Ambulatory Visit: Payer: Self-pay | Admitting: Gynecology

## 2018-09-23 DIAGNOSIS — M810 Age-related osteoporosis without current pathological fracture: Secondary | ICD-10-CM

## 2018-09-23 DIAGNOSIS — Z780 Asymptomatic menopausal state: Secondary | ICD-10-CM

## 2018-09-26 ENCOUNTER — Encounter: Payer: Self-pay | Admitting: Gynecology

## 2018-09-26 ENCOUNTER — Telehealth: Payer: Self-pay | Admitting: Gynecology

## 2018-09-26 DIAGNOSIS — M810 Age-related osteoporosis without current pathological fracture: Secondary | ICD-10-CM

## 2018-09-26 NOTE — Telephone Encounter (Signed)
Tell patient her recent bone density continues to show osteoporosis.  My recommendation given her history would be a consult with Dr Cruzita Lederer.  I think she should be on some medication but knowing she had a femoral fracture on Fosamax complicates the decision.

## 2018-09-27 ENCOUNTER — Ambulatory Visit
Admission: RE | Admit: 2018-09-27 | Discharge: 2018-09-27 | Disposition: A | Discharge status: Home / Self Care | Payer: Medicare Other | Source: Ambulatory Visit | Attending: Gynecology | Admitting: Gynecology

## 2018-09-27 DIAGNOSIS — Z1231 Encounter for screening mammogram for malignant neoplasm of breast: Secondary | ICD-10-CM

## 2018-09-28 NOTE — Telephone Encounter (Signed)
Left message for pt to call.

## 2018-09-28 NOTE — Telephone Encounter (Signed)
Patient informed, referral placed at [facility] they will call to schedule.

## 2018-09-30 DIAGNOSIS — D692 Other nonthrombocytopenic purpura: Secondary | ICD-10-CM | POA: Diagnosis not present

## 2018-09-30 DIAGNOSIS — D485 Neoplasm of uncertain behavior of skin: Secondary | ICD-10-CM | POA: Diagnosis not present

## 2018-09-30 DIAGNOSIS — L814 Other melanin hyperpigmentation: Secondary | ICD-10-CM | POA: Diagnosis not present

## 2018-10-05 ENCOUNTER — Encounter: Payer: Self-pay | Admitting: Internal Medicine

## 2018-10-14 DIAGNOSIS — L821 Other seborrheic keratosis: Secondary | ICD-10-CM | POA: Diagnosis not present

## 2018-10-14 DIAGNOSIS — D692 Other nonthrombocytopenic purpura: Secondary | ICD-10-CM | POA: Diagnosis not present

## 2018-10-14 DIAGNOSIS — L820 Inflamed seborrheic keratosis: Secondary | ICD-10-CM | POA: Diagnosis not present

## 2018-10-14 DIAGNOSIS — Z85828 Personal history of other malignant neoplasm of skin: Secondary | ICD-10-CM | POA: Diagnosis not present

## 2018-10-14 DIAGNOSIS — L814 Other melanin hyperpigmentation: Secondary | ICD-10-CM | POA: Diagnosis not present

## 2018-10-14 DIAGNOSIS — D1801 Hemangioma of skin and subcutaneous tissue: Secondary | ICD-10-CM | POA: Diagnosis not present

## 2018-10-27 NOTE — Telephone Encounter (Signed)
patient scheduled on 12/01/18 @ 11:15am

## 2018-10-28 ENCOUNTER — Ambulatory Visit (INDEPENDENT_AMBULATORY_CARE_PROVIDER_SITE_OTHER): Payer: Medicare Other | Admitting: Family Medicine

## 2018-10-28 ENCOUNTER — Other Ambulatory Visit: Payer: Self-pay

## 2018-10-28 DIAGNOSIS — E119 Type 2 diabetes mellitus without complications: Secondary | ICD-10-CM | POA: Diagnosis not present

## 2018-10-28 DIAGNOSIS — I10 Essential (primary) hypertension: Secondary | ICD-10-CM | POA: Diagnosis not present

## 2018-10-28 DIAGNOSIS — E785 Hyperlipidemia, unspecified: Secondary | ICD-10-CM | POA: Diagnosis not present

## 2018-10-28 NOTE — Progress Notes (Signed)
Patient ID: [name], female   DOB: October 11, 1940, 78 y.o.   MRN: [id number]  This visit type was conducted due to national recommendations for restrictions regarding the COVID-19 pandemic in an effort to limit this patient's exposure and mitigate transmission in our community.   Virtual Visit via Telephone Note  I connected with [name] on 10/28/18 at 11:00 AM EDT by telephone and verified that I am speaking with the correct person using two identifiers.   I discussed the limitations, risks, security and privacy concerns of performing an evaluation and management service by telephone and the availability of in person appointments. I also discussed with the patient that there may be a patient responsible charge related to this service. The patient expressed understanding and agreed to proceed.  Location patient: home Location provider: work or home office Participants present for the call: patient, provider Patient did not have a visit in the prior 7 days to address this/these issue(s).   History of Present Illness: Medical follow-up for her hypertension, hyperlipidemia, and type 2 diabetes.  Her current medications include estradiol patch which she gets through GYN, lisinopril, and simvastatin.  Her diabetes is been controlled through diet.  Her blood sugars are stable.  Last A1c 5.8%.  No recent falls.  She had some recent hip pain and went to the ER and femur films were unremarkable.  Suspected bursitis.  Symptoms stable.  She denies any recent chest pains, dizziness, dyspnea, fever, cough  Past Medical History:  Diagnosis Date  . Broken femur ([place])   . Cancer ([place]) 2001   skin, nose  . Cataract   . Diabetes mellitus    Borderline/no meds  . Diverticulosis of colon   . DNR (do not resuscitate) 2009  . GERD (gastroesophageal reflux disease)   . Hyperlipidemia   . Hypertension   . Kidney stones    1 time  . Migraines    during menopause  . Osteoporosis 09/2018   T score -2.7  stable from prior DEXA.  [name] Pneumothorax on left    Past Surgical History:  Procedure Laterality Date  . ABDOMINAL HYSTERECTOMY  1994   fibroids/ complete  . BUNIONECTOMY  2004   left  . CARPAL TUNNEL RELEASE  2000   right   . CATARACT EXTRACTION     Bil  . FEMUR FRACTURE SURGERY    . LUNG SURGERY  2008   left lung VATS for biopsy and removal of blebs with pleurodesis  . OOPHORECTOMY     BSO  . TONSILLECTOMY     as child    reports that she has never smoked. She has never used smokeless tobacco. She reports that she does not drink alcohol or use drugs. family history includes Cancer in her father, mother, and sister; Diabetes in her sister; Emphysema in her maternal aunt; Hyperlipidemia in her sister; Ovarian cancer in her sister; Pulmonary embolism in her father. No Known Allergies    Observations/Objective: Patient sounds cheerful and well on the phone. I do not appreciate any SOB. Speech and thought processing are grossly intact. Patient reported vitals:  Assessment and Plan:  #1 hypertension which has been stable -Continue lisinopril  #2 dyslipidemia.  Patient on simvastatin -We will plan lab work with lipid panel and chemistries when she comes back for follow-up in January  #3 history of osteoporosis   Follow Up Instructions:  -5 to 88-month follow-up in January   [id number] 5-10 [id number] 11-20 [id number] 21-30 I did not refer this  patient for an OV in the next 24 hours for this/these issue(s).  I discussed the assessment and treatment plan with the patient. The patient was provided an opportunity to ask questions and all were answered. The patient agreed with the plan and demonstrated an understanding of the instructions.   The patient was advised to call back or seek an in-person evaluation if the symptoms worsen or if the condition fails to improve as anticipated.  I provided 17 minutes of non-face-to-face time during this encounter.   [name], MD

## 2018-12-01 ENCOUNTER — Other Ambulatory Visit: Payer: Self-pay

## 2018-12-01 ENCOUNTER — Encounter: Payer: Self-pay | Admitting: Internal Medicine

## 2018-12-01 ENCOUNTER — Ambulatory Visit (INDEPENDENT_AMBULATORY_CARE_PROVIDER_SITE_OTHER): Payer: Medicare Other | Admitting: Internal Medicine

## 2018-12-01 VITALS — BP 102/60 | HR 87 | Ht 60.0 in | Wt 124.0 lb

## 2018-12-01 DIAGNOSIS — M810 Age-related osteoporosis without current pathological fracture: Secondary | ICD-10-CM

## 2018-12-01 LAB — TSH: TSH: 1.55 u[IU]/mL (ref 0.35–4.50)

## 2018-12-01 LAB — BASIC METABOLIC PANEL WITH GFR
BUN: 16 mg/dL (ref 7–25)
CO2: 29 mmol/L (ref 20–32)
Calcium: 10 mg/dL (ref 8.6–10.4)
Chloride: 103 mmol/L (ref 98–110)
Creat: 0.66 mg/dL (ref 0.60–0.93)
GFR, Est African American: 98 mL/min/{1.73_m2} (ref >=60)
GFR, Est Non African American: 85 mL/min/{1.73_m2} (ref >=60)
Glucose, Bld: 104 mg/dL — ABNORMAL HIGH (ref 65–99)
Potassium: 4.7 mmol/L (ref 3.5–5.3)
Sodium: 139 mmol/L (ref 135–146)

## 2018-12-01 LAB — VITAMIN D 25 HYDROXY (VIT D DEFICIENCY, FRACTURES): VITD: 45.51 ng/mL (ref 30.00–100.00)

## 2018-12-01 NOTE — Progress Notes (Signed)
Patient ID: MIRL FYE, female   DOB: 06-30-1940, 78 y.o.   MRN: [id number]    HPI  [name] is a 78 y.o.-year-old female, referred by Dr. [name], for management of osteoporosis (OP).  Pt was dx with OP in 2018.  I reviewed pt's DXA scan reports: Date L1-L4 T score FN T score 33% distal Radius (R)  09/23/2018 ([facility])  -2.7  RFN: -1.1 (+4.6%*) LFN: n/a  -2.2  06/23/2016 ([facility])  -2.7 (+3.1%) RFN: -1.4 (+3.3%) LFN: -0.8 (+11.5%*) n/a  04/24/2014 ([facility])  -2.9 RFN: -1.6 LFN: -1.5 n/a   She has a history of left hip fracture 06/26/2017  - tripped, followed by left hip surgery with rod + screw.  No dizziness/vertigo/orthostasis/poor vision.   Previous OP treatments:   Fosamax - started "years ago" - off and on  Fosamax - started 2015-2018 as her L hip started to hurt   + h/o vitamin D deficiency. reviewed available vit D levels: 06/27/2017: 45 No results found for: VD25OH  Pt is on: - calcium 1200 mg - vitamin D 2000 U  No weight bearing exercises. She was walking until her fracture.  She does some exercises at home now.  She does not take high vitamin A doses.  Menopause was at 78 y/o. On Estradiol  - patch 0.375 mg.   FH of osteoporosis in mother who had bilateral hip fractures and wrist fracture and bone cancer. Also, 2 sisters with decreased bne density.   She has been on Prednisone in the past (2019). She had a partially collapsed lung.  No h/o hyper/hypocalcemia or hyperparathyroidism. No h/o kidney stones. Lab Results  Component Value Date   CALCIUM 10.1 04/29/2018   CALCIUM 9.4 04/30/2017   CALCIUM 9.3 04/28/2016   CALCIUM 9.9 07/25/2012   CALCIUM 9.9 07/09/2010   CALCIUM 8.7 04/07/2007   CALCIUM 8.6 04/06/2007   CALCIUM 7.6 (L) 04/05/2007   No h/o thyrotoxicosis. Reviewed TSH recent levels:  Lab Results  Component Value Date   TSH 1.29 07/28/2012   No h/o CKD. Last BUN/Cr: Lab Results  Component Value Date   BUN 17 04/29/2018   CREATININE 0.70  04/29/2018   She also has well controlled DM, managed by PCP, managed by PC: Lab Results  Component Value Date   HGBA1C 5.8 (A) 04/29/2018   HGBA1C 6.2 (A) 10/26/2017   HGBA1C 6.6 (H) 04/30/2017   HGBA1C 6.1 10/27/2016   HGBA1C 6.2 04/28/2016   HGBA1C 6.0 10/23/2015   HGBA1C 6.1 04/17/2015   HGBA1C 5.8 10/11/2014   HGBA1C 6.2 04/10/2014   HGBA1C 5.9 10/04/2013  2012: HbA1c 6.6%  She had HL, HTN, GERD, history of skin cancer.   ROS: Constitutional: no weight gain, no weight loss, no fatigue, no subjective hyperthermia, no subjective hypothermia, no nocturia Eyes: no blurry vision, no xerophthalmia ENT: no sore throat, no nodules palpated in neck, no dysphagia, no odynophagia, no hoarseness, no tinnitus, no hypoacusis Cardiovascular: no CP, no SOB, no palpitations, no leg swelling Respiratory: no cough, no SOB, no wheezing Gastrointestinal: no N, no V, no D, no C, no acid reflux Musculoskeletal: no muscle, + joint aches Skin: no rash, + hair thinning, + easy bruising Neurological: no tremors, no numbness or tingling/no dizziness/no HAs Psychiatric: no depression, no anxiety  I reviewed pt's medications, allergies, PMH, social hx, family hx, and changes were documented in the history of present illness. Otherwise, unchanged from my initial visit note.  Past Medical History:  Diagnosis Date  . Broken  femur ([place])   . Cancer ([place]) 2001   skin, nose  . Cataract   . Diabetes mellitus    Borderline/no meds  . Diverticulosis of colon   . DNR (do not resuscitate) 2009  . GERD (gastroesophageal reflux disease)   . Hyperlipidemia   . Hypertension   . Kidney stones    1 time  . Migraines    during menopause  . Osteoporosis 09/2018   T score -2.7 stable from prior DEXA.  [name] Pneumothorax on left    Past Surgical History:  Procedure Laterality Date  . ABDOMINAL HYSTERECTOMY  1994   fibroids/ complete  . BUNIONECTOMY  2004   left  . CARPAL TUNNEL RELEASE  2000   right   .  CATARACT EXTRACTION     Bil  . FEMUR FRACTURE SURGERY    . LUNG SURGERY  2008   left lung VATS for biopsy and removal of blebs with pleurodesis  . OOPHORECTOMY     BSO  . TONSILLECTOMY     as child   Social History   Socioeconomic History  . Marital status: Divorced    Spouse name: Not on file  . Number of children: 0  . Years of education: Not on file  . Highest education level: Not on file  Occupational History  .  Divorced  Social Needs  . Financial resource strain: Not on file  . Food insecurity    Worry: Not on file    Inability: Not on file  . Transportation needs    Medical: Not on file    Non-medical: Not on file  Tobacco Use  . Smoking status: Never Smoker  . Smokeless tobacco: Never Used  Substance and Sexual Activity  . Alcohol use: No    Alcohol/week: 0.0 standard drinks  . Drug use: No  . Sexual activity: Not Currently    Comment: 1st intercourse 78 yo-Fewer than 5 partners   Current Outpatient Medications on File Prior to Visit  Medication Sig Dispense Refill  . aspirin EC 81 MG tablet Take 81 mg by mouth daily.    . Calcium Citrate-Vitamin D (CALCIUM CITRATE + D3 PO) 800mg  calcium 1000 IU Vit. D. Take by mouth daily.    . Chromium-Cinnamon (CINNAMON PLUS CHROMIUM PO) 2000 mg cinnamon 200 mcg Chromium Take by mouth daily.    . cycloSPORINE (RESTASIS) 0.05 % ophthalmic emulsion Place 1 drop into both eyes 2 (two) times daily.    [name] estradiol (CLIMARA - DOSED IN MG/24 HR) 0.075 mg/24hr patch Place 1 patch (0.075 mg total) onto the skin once a week. 4 patch 12  . glucose blood test strip Use as instructed 100 each 11  . lisinopril (PRINIVIL,ZESTRIL) 10 MG tablet Take 1 tablet (10 mg total) by mouth daily. Per Dr Elease Hashimoto 90 tablet 3  . Multiple Vitamin (MULTIVITAMIN PO) Take 1 tablet by mouth daily.     [name] OVER THE COUNTER MEDICATION Omega - 3 1040 mg take one tab daily    . simvastatin (ZOCOR) 20 MG tablet Take 1 tablet (20 mg total) by mouth daily. 90  tablet 3  . Ultilet Classic Lancets MISC Testing BS once daily 100 each 11   No current facility-administered medications on file prior to visit.    No Known Allergies Family History  Problem Relation Age of Onset  . Cancer Mother        bone  . Diabetes Sister   . Hyperlipidemia Sister   . Ovarian cancer Sister   .  Pulmonary embolism Father   . Cancer Father        Facial  . Cancer Sister        melanoma  . Emphysema Maternal Aunt   . Breast cancer Neg Hx     PE: BP 102/60   Pulse 87   Ht 5' (1.524 m)   Wt 124 lb (56.2 kg)   SpO2 95%   BMI 24.22 kg/m  Wt Readings from Last 3 Encounters:  12/01/18 124 lb (56.2 kg)  06/14/18 121 lb (54.9 kg)  04/29/18 122 lb 1.6 oz (55.4 kg)   Constitutional: Normal weight, in NAD. No kyphosis.  No pain on percussion of the spine. Eyes: PERRLA, EOMI, no exophthalmos ENT: moist mucous membranes, no thyromegaly, no cervical lymphadenopathy Cardiovascular: RRR, No MRG Respiratory: CTA B Gastrointestinal: abdomen soft, NT, ND, BS+ Musculoskeletal: no deformities, strength intact in all 4 Skin: moist, warm, no rashes Neurological: no tremor with outstretched hands, DTR normal in all 4  Assessment: 1. Osteoporosis  Plan: 1. Osteoporosis - likely postmenopausal/age-related, she has FH of OP - Discussed about increased risk of fracture, depending on the T score, greatly increased when the T score is lower than -2.5, but it is actually a continuum and -2.5 should not be regarded as an absolute threshold. We reviewed her labs 3 DXA scan reports together, and I explained that based on the T scores, she has an increased risk for fractures.  However, between 2018 and 2020 her bone density scores were stable.  -Unfortunately, patient has a history of bisphosphonate use and atypical fracture after 3 years of alendronate.  She developed pain in her left hip in 02/2017 and finally had a fracture in 06/2017.  Of note, she stopped alendronate due to  left hip pain in 02/2017.  She had surgery with rod implanted (images brought by patient was reviewed along with her) and also had bilateral imaging at that time that did not show an abnormality in her left hip.  She does still continue to have occasional pain in the left hip but not in the right hip. - we reviewed her dietary and supplemental calcium and vitamin D intake. I recommended to make sure she gets 1000-1200 mg of calcium daily preferentially from diet and I will check vit D today to see if she needs further supplementation.  As of now, she is taking 2000 units vitamin D daily - discussed fall precautions   - given handout from [place] Re: weight bearing exercises - advised to do this every day or at least 5/7 days - we discussed about maintaining a good amount of protein in her diet. The recommended daily protein intake is ~0.8 g per kilogram per day. I advised her to try to aim for this amount, since a diet low in proteins can exacerbate osteoporosis. Also, avoid smoking or >2 drinks of alcohol a day. - I recommended the following book for the concept of low acid eating.  She is doing a good job with her diet including plenty of veggies, fruit, whole grains but also eats cheese.  - We discussed about the different medication classes, benefits and side effects (including atypical fractures and ONJ - no dental workup in progress or planned).  -We discussed that antiresorptive medicines do have atypical femoral fractures (AFF) and side effects but Prolia (denosumab) would be preferred in this situation since the incidence of AFF is lower.  I explained that this is injected subcutaneously every 6 months for 6  to 10 years.  I would not suggest to go ahead with bisphosphonates for her, but we did discuss that if she does decide to start Prolia, we will need to follow-up despite bisphosphonates after she finishes the treatment so that she does not lose bone density.  I would  suggest teriparatide/abaloparatide and Romosozumab as a last resort.  She was also not very excited about daily injections for the first or the higher risk for stroke for the latter.  - Pt was given reading information about Prolia, and I explained the mechanism of action and expected benefits.  - As of now, she is undecided about starting Prolia and we decided to check a bone turnover marker (C-telopeptide).  If this is elevated, I would definitely suggest to start treatment, but if this is suppressed, maybe we can delay treatment for a few months. - Will check the following labs today: Orders Placed This Encounter  Procedures  . BASIC METABOLIC PANEL WITH GFR  . VITAMIN D 25 Hydroxy (Vit-D Deficiency, Fractures)  . TSH  . C-Telopeptide, Serum  -If we do end up starting Prolia, we discussed that we would check a new DXA scan in 2 years after starting the medication-  I explained that the first indication that the treatment is working is her not having anymore fractures. DXA scan changes are secondary: unchanged or slightly higher T-scores are desirable - will see pt back in 6 months, however, I advised her to let me know if she develops hip pain before next visit  - time spent with the patient: 1 hour, of which >50% was spent in obtaining information about her symptoms, reviewing her previous labs, evaluations, and treatments, counseling her about her condition (please see the discussed topics above), and developing a plan to further investigate and treat it; she had a number of questions which I addressed.  Component     Latest Ref Rng & Units 12/01/2018  Glucose     65 - 99 mg/dL 104 (H)  BUN     7 - 25 mg/dL 16  Creatinine     0.60 - 0.93 mg/dL 0.66  GFR, Est Non African American     > OR = 60 mL/min/1.64m2 85  GFR, Est African American     > OR = 60 mL/min/1.66m2 98  BUN/Creatinine Ratio     6 - 22 (calc) NOT APPLICABLE  Sodium     [id number] - 146 mmol/L 139  Potassium     3.5 - 5.3  mmol/L 4.7  Chloride     98 - 110 mmol/L 103  CO2     20 - 32 mmol/L 29  Calcium     8.6 - 10.4 mg/dL 10.0  VITD     30.00 - 100.00 ng/mL 45.51  TSH     0.35 - 4.50 uIU/mL 1.55  C-Telopeptide, Serum     pg/mL 125   Labs are normal including vitamin D, TSH, kidney function.  Glucose slightly high.  CTX is in the lower part of the normal interval.  Therefore, she can probably wait another 6 months to a year until making a decision for antiresorptive therapy.  Philemon Kingdom, MD PhD [facility] Endocrinology

## 2018-12-01 NOTE — Patient Instructions (Addendum)
Please stop at the lab.  Please come back for a follow-up appointment in 6 month.  How Can I Prevent Falls? Men and women with osteoporosis need to take care not to fall down. Falls can break bones. Some reasons people fall are: Poor vision  Poor balance  Certain diseases that affect how you walk  Some types of medicine, such as sleeping pills.  Some tips to help prevent falls outdoors are: Use a cane or walker  Wear rubber-soled shoes so you don't slip  Walk on grass when sidewalks are slippery  In winter, put salt or kitty litter on icy sidewalks.  Some ways to help prevent falls indoors are: Keep rooms free of clutter, especially on floors  Use plastic or carpet runners on slippery floors  Wear low-heeled shoes that provide good support  Do not walk in socks, stockings, or slippers  Be sure carpets and area rugs have skid-proof backs or are tacked to the floor  Be sure stairs are well lit and have rails on both sides  Put grab bars on bathroom walls near tub, shower, and toilet  Use a rubber bath mat in the shower or tub  Keep a flashlight next to your bed  Use a sturdy step stool with a handrail and wide steps  Add more lights in rooms (and night lights) Buy a cordless phone to keep with you so that you don't have to rush to the phone       when it rings and so that you can call for help if you fall.   (adapted from http://www.niams.NightlifePreviews.se)  Please check out the following book about best diet for bone health:    Exercise for Strong Bones (from [place]) There are two types of exercises that are important for building and maintaining bone density:  weight-bearing and muscle-strengthening exercises. Weight-bearing Exercises These exercises include activities that make you move against gravity while staying upright. Weight-bearing exercises can be high-impact or low-impact. High-impact weight-bearing  exercises help build bones and keep them strong. If you have broken a bone due to osteoporosis or are at risk of breaking a bone, you may need to avoid high-impact exercises. If youre not sure, you should check with your healthcare provider. Examples of high-impact weight-bearing exercises are:  Dancing  Doing high-impact aerobics  Hiking  Jogging/running  Jumping Rope  Stair climbing  Tennis Low-impact weight-bearing exercises can also help keep bones strong and are a safe alternative if you cannot do high-impact exercises. Examples of low-impact weight-bearing exercises are:  Using elliptical training machines  Doing low-impact aerobics  Using stair-step machines  Fast walking on a treadmill or outside Muscle-Strengthening Exercises These exercises include activities where you move your body, a weight or some other resistance against gravity. They are also known as resistance exercises and include:  Lifting weights  Using elastic exercise bands  Using weight machines  Lifting your own body weight  Functional movements, such as standing and rising up on your toes Yoga and Pilates can also improve strength, balance and flexibility. However, certain positions may not be safe for people with osteoporosis or those at increased risk of broken bones. For example, exercises that have you bend forward may increase the chance of breaking a bone in the spine. A physical therapist should be able to help you learn which exercises are safe and appropriate for you. Non-Impact Exercises Non-impact exercises can help you to improve balance, posture and how well you move in everyday activities.  These exercises can also help to increase muscle strength and decrease the risk of falls and broken bones. Some of these exercises include:  Balance exercises that strengthen your legs and test your balance, such as Tai Chi, can decrease your risk of falls.  Posture exercises that improve your  posture and reduce rounded or sloping shoulders can help you decrease the chance of breaking a bone, especially in the spine.  Functional exercises that improve how well you move can help you with everyday activities and decrease your chance of falling and breaking a bone. For example, if you have trouble getting up from a chair or climbing stairs, you should do these activities as exercises. A physical therapist can teach you balance, posture and functional exercises. Starting a New Exercise Program If you havent exercised regularly for a while, check with your healthcare provider before beginning a new exercise program--particularly if you have health problems such as heart disease, diabetes or high blood pressure. If youre at high risk of breaking a bone, you should work with a physical therapist to develop a safe exercise program. Once you have your healthcare providers approval, start slowly. If youve already broken bones in the spine because of osteoporosis, be very careful to avoid activities that require reaching down, bending forward, rapid twisting motions, heavy lifting and those that increase your chance of a fall. As you get started, your muscles may feel sore for a day or two after you exercise. If soreness lasts longer, you may be working too hard and need to ease up. Exercises should be done in a pain-free range of motion. How Much Exercise Do You Need? Weight-bearing exercises 30 minutes on most days of the week. Do a 30-minutesession or multiple sessions spread out throughout the day. The benefits to your bones are the same.   Muscle-strengthening exercises Two to three days per week. If you dont have much time for strengthening/resistance training, do small amounts at a time. You can do just one body part each day. For example do arms one day, legs the next and trunk the next. You can also spread these exercises out during your normal day.  Balance, posture and functional  exercises Every day or as often as needed. You may want to focus on one area more than the others. If you have fallen or lose your balance, spend time doing balance exercises. If you are getting rounded shoulders, work more on posture exercises. If you have trouble climbing stairs or getting up from the couch, do more functional exercises. You can also perform these exercises at one time or spread them during your day. Work with a phyiscal therapist to learn the right exercises for you.   Denosumab: Patient drug information (Up-to-date) Copyright (779)[phone] [place] rights reserved.  Brand Names: [place]  ProliaDelton See What is this drug used for?  It is used to treat soft, brittle bones (osteoporosis).  It is used for bone growth.  It is used when treating some cancers.  It may be given to you for other reasons. Talk with the doctor. What do I need to tell my doctor BEFORE I take this drug?  All products:  If you have an allergy to denosumab or any other part of this drug.  If you are allergic to any drugs like this one, any other drugs, foods, or other substances. Tell your doctor about the allergy and what signs you had, like rash; hives; itching; shortness of breath; wheezing; cough; swelling  of face, lips, tongue, or throat; or any other signs.  If you have low calcium levels.  Prolia:  If you are pregnant or may be pregnant. Do not take this drug if you are pregnant.  This is not a list of all drugs or health problems that interact with this drug.  Tell your doctor and pharmacist about all of your drugs (prescription or OTC, natural products, vitamins) and health problems. You must check to make sure that it is safe for you to take this drug with all of your drugs and health problems. Do not start, stop, or change the dose of any drug without checking with your doctor. What are some things I need to know or do while I take this drug?  All products:  Tell dentists,  surgeons, and other doctors that you use this drug.  This drug may raise the chance of a broken leg. Talk with your doctor.  Have your blood work checked. Talk with your doctor.  Have a bone density test. Talk with your doctor.  Take calcium and vitamin D as you were told by your doctor.  Have a dental exam before starting this drug.  Take good care of your teeth. See a dentist often.  If you smoke, talk with your doctor.  Do not give to a child. Talk with your doctor.  Tell your doctor if you are breast-feeding. You will need to talk about any risks to your baby.  Delton See:  This drug may cause harm to the unborn baby if you take it while you are pregnant. If you get pregnant while taking this drug, call your doctor right away.  Prolia:  Very bad infections have been reported with use of this drug. If you have any infection, are taking antibiotics now or in the recent past, or have many infections, talk with your doctor.  You may have more chance of getting an infection. Wash hands often. Stay away from people with infections, colds, or flu.  Use birth control that you can trust to prevent pregnancy while taking this drug.  If you are a man and your sex partner is pregnant or gets pregnant at any time while you are being treated, talk with your doctor. What are some side effects that I need to call my doctor about right away?  WARNING/CAUTION: Even though it may be rare, some people may have very bad and sometimes deadly side effects when taking a drug. Tell your doctor or get medical help right away if you have any of the following signs or symptoms that may be related to a very bad side effect:  All products:  Signs of an allergic reaction, like rash; hives; itching; red, swollen, blistered, or peeling skin with or without fever; wheezing; tightness in the chest or throat; trouble breathing or talking; unusual hoarseness; or swelling of the mouth, face, lips, tongue, or throat.    Signs of low calcium levels like muscle cramps or spasms, numbness and tingling, or seizures.  Mouth sores.  Any new or strange groin, hip, or thigh pain.  This drug may cause jawbone problems. The chance may be higher the longer you take this drug. The chance may be higher if you have cancer, dental problems, dentures that do not fit well, anemia, blood clotting problems, or an infection. The chance may also be higher if you are having dental work or if you are getting chemo, some steroid drugs, or radiation. Call your doctor right away if you  have jaw swelling or pain.  Xgeva:  Not hungry.  Muscle pain or weakness.  Seizures.  Shortness of breath.  Prolia:  Signs of infection. These include a fever of 100.64F (38C) or higher, chills, very bad sore throat, ear or sinus pain, cough, more sputum or change in color of sputum, pain with passing urine, mouth sores, wound that will not heal, or anal itching or pain.  Signs of a pancreas problem (pancreatitis) like very bad stomach pain, very bad back pain, or very bad upset stomach or throwing up.  Chest pain.  A heartbeat that does not feel normal.  Very bad skin irritation.  Feeling very tired or weak.  Bladder pain or pain when passing urine or change in how much urine is passed.  Passing urine often.  Swelling in the arms or legs. What are some other side effects of this drug?  All drugs may cause side effects. However, many people have no side effects or only have minor side effects. Call your doctor or get medical help if any of these side effects or any other side effects bother you or do not go away:  Xgeva:  Feeling tired or weak.  Headache.  Upset stomach or throwing up.  Loose stools (diarrhea).  Cough.  Prolia:  Back pain.  Muscle or joint pain.  Sore throat.  Runny nose.  Pain in arms or legs.  These are not all of the side effects that may occur. If you have questions about side effects,  call your doctor. Call your doctor for medical advice about side effects.  You may report side effects to your national health agency. How is this drug best taken?  Use this drug as ordered by your doctor. Read and follow the dosing on the label closely.  It is given as a shot into the fatty part of the skin. What do I do if I miss a dose?  Call the doctor to find out what to do. How do I store and/or throw out this drug?  This drug will be given to you in a hospital or doctor's office. You will not store it at home.  Keep all drugs out of the reach of children and pets.  Check with your pharmacist about how to throw out unused drugs.  General drug facts  If your symptoms or health problems do not get better or if they become worse, call your doctor.  Do not share your drugs with others and do not take anyone else's drugs.  Keep a list of all your drugs (prescription, natural products, vitamins, OTC) with you. Give this list to your doctor.  Talk with the doctor before starting any new drug, including prescription or OTC, natural products, or vitamins.  Some drugs may have another patient information leaflet. If you have any questions about this drug, please talk with your doctor, pharmacist, or other health care provider.  If you think there has been an overdose, call your poison control center or get medical care right away. Be ready to tell or show what was taken, how much, and when it happened.

## 2018-12-04 LAB — C-TELOPEPTIDE, SERUM: C-Telopeptide, Serum: 125 pg/mL

## 2018-12-09 ENCOUNTER — Telehealth: Payer: Self-pay | Admitting: Internal Medicine

## 2018-12-09 NOTE — Telephone Encounter (Signed)
Patient ph# [phone] called re: patient received copy of lab results and on page 2 in regards to Vitamin D-it shows value as 45.51. Is that too low.? Should patient increase Vitamin D intake. Please call patient at the ph# listed above to advise.Patient is presently taking 2,000 IU Vitamin D daily. Also, in 1st paragraph it states : you can probably wait another 6 months to a year until making a decision for Antiresorptive therapy. Patient wants to know if she should push out the appointment for June 06, 2019.(Push it out for 1 year).

## 2018-12-09 NOTE — Telephone Encounter (Signed)
The vitamin D is excellent, and I would not change her supplement dose for now. Regarding the appointment, we can definitely proceed out to 1 year.

## 2018-12-09 NOTE — Telephone Encounter (Signed)
Notified patient of message from Dr. Gherghe, patient expressed understanding and agreement. No further questions.  

## 2018-12-15 DIAGNOSIS — Z23 Encounter for immunization: Secondary | ICD-10-CM | POA: Diagnosis not present

## 2018-12-16 ENCOUNTER — Telehealth: Payer: Self-pay | Admitting: Family Medicine

## 2018-12-16 NOTE — Telephone Encounter (Signed)
noted 

## 2018-12-16 NOTE — Telephone Encounter (Signed)
Patient is calling to notify Dr. Elease Hashimoto that she got her flu shot yesterday 12/15/2018 at [facility].

## 2019-01-12 ENCOUNTER — Encounter: Payer: Self-pay | Admitting: Gynecology

## 2019-03-13 ENCOUNTER — Telehealth: Payer: Self-pay

## 2019-03-13 NOTE — Telephone Encounter (Signed)
Copied from [place] #[id number]. Topic: General - Inquiry >> Mar 13, 2019 12:42 PM Alease Frame wrote: Reason for CRM: patient is requesting blood work A1C work . Please advise

## 2019-03-13 NOTE — Telephone Encounter (Signed)
Called patient and I have scheduled her for her 6 month follow up at 10:30am with Dr. Elease Hashimoto on 04/28/19. Patient verbalized an understanding.

## 2019-03-23 IMAGING — MG DIGITAL SCREENING BILATERAL MAMMOGRAM WITH TOMO AND CAD
8 series · 9 of 24 positions shown · non-contrast
Comparison: Previous exam(s).

CLINICAL DATA: Screening.

EXAM:
DIGITAL SCREENING BILATERAL MAMMOGRAM WITH TOMO AND CAD

[L CC synth-2D]
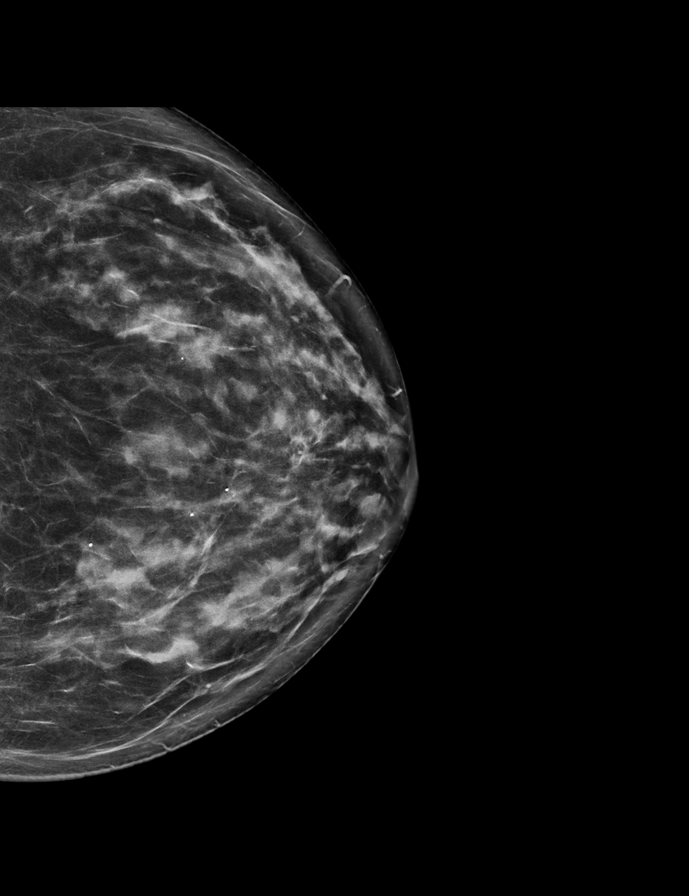

[R CC synth-2D]
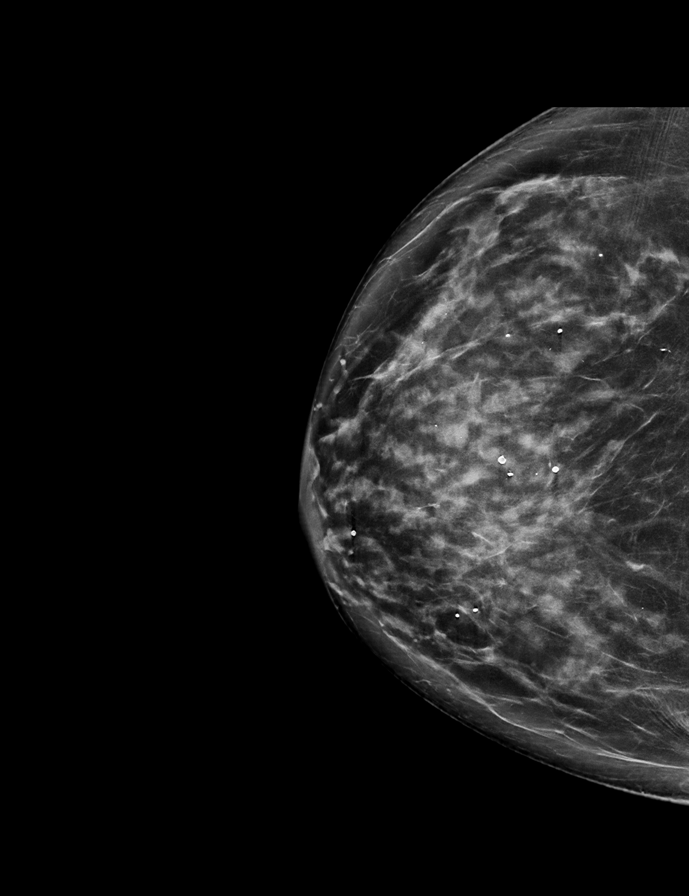

[R MLO synth-2D]
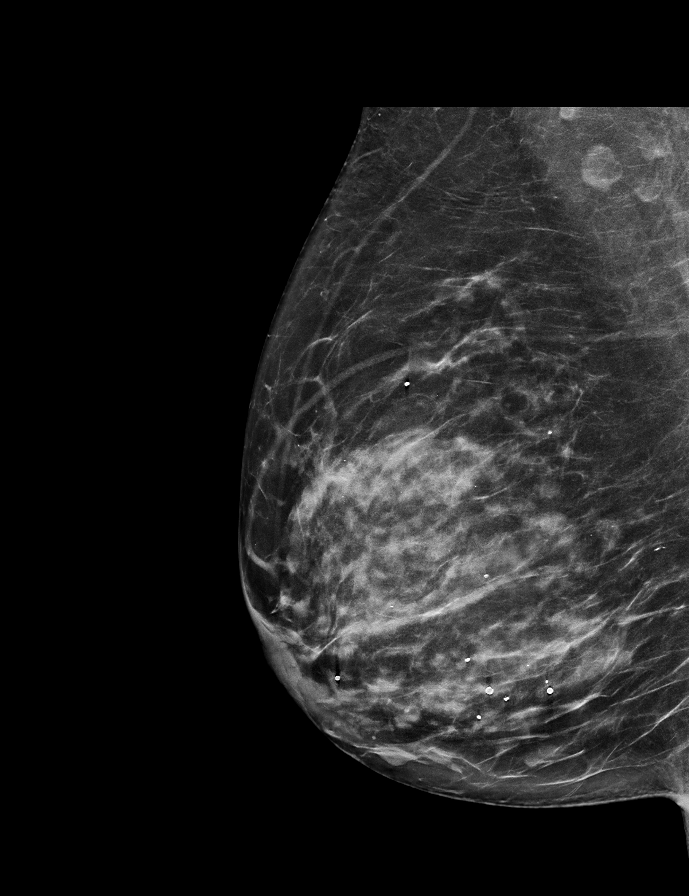

[L MLO synth-2D]
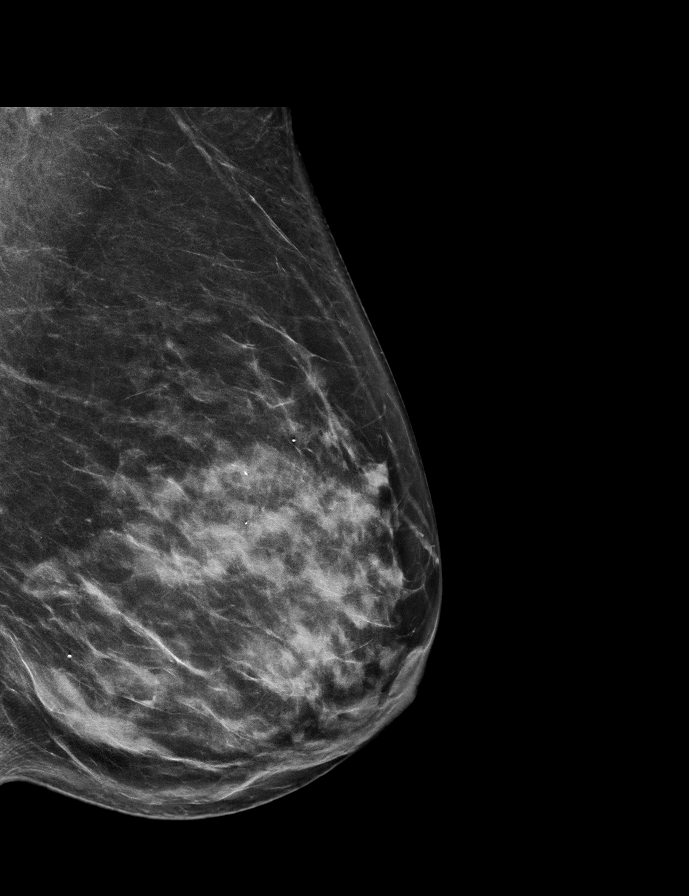

[L MLO tomo · 2 of 73 frames shown]
[frame 24/73]
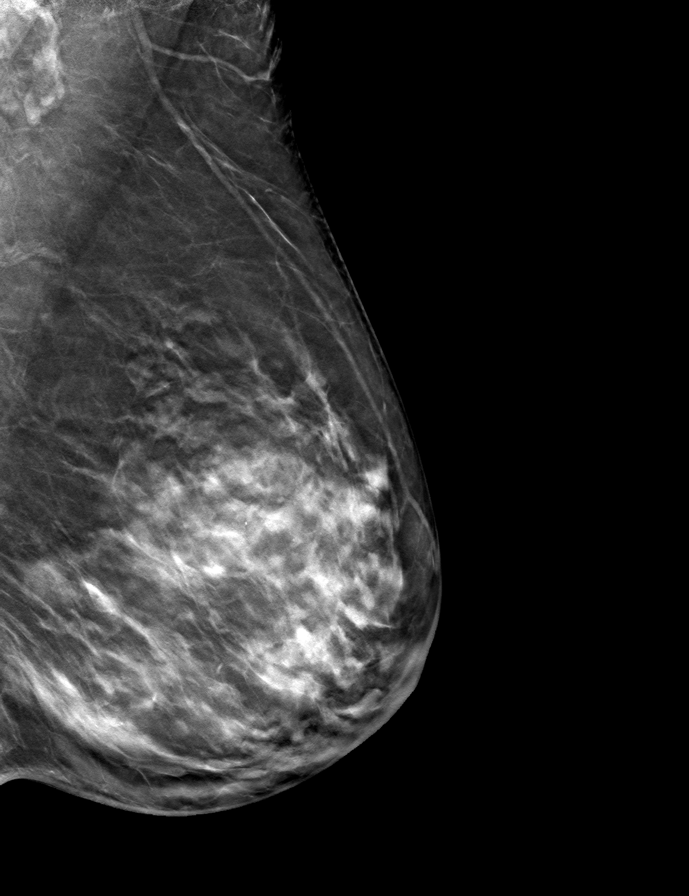
[frame 37/73]
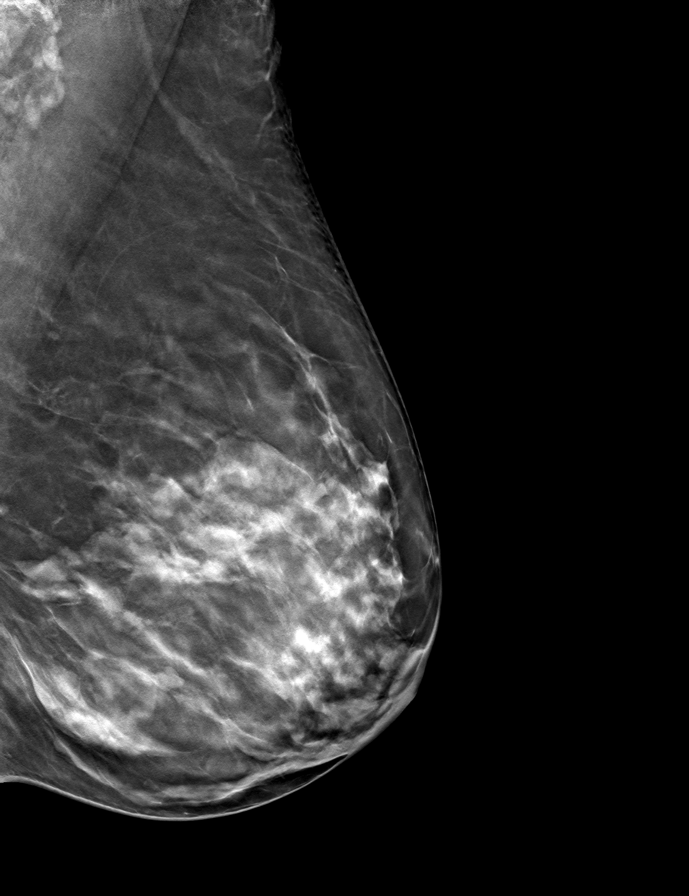

[R CC tomo · tomo slice 39/77.0]
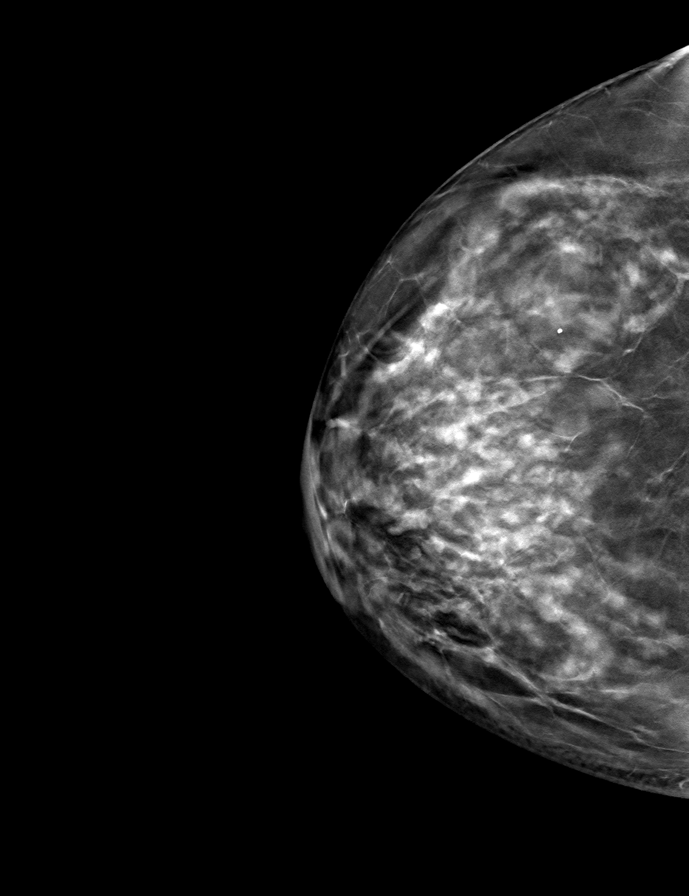

[L CC tomo · tomo slice 36/71.0]
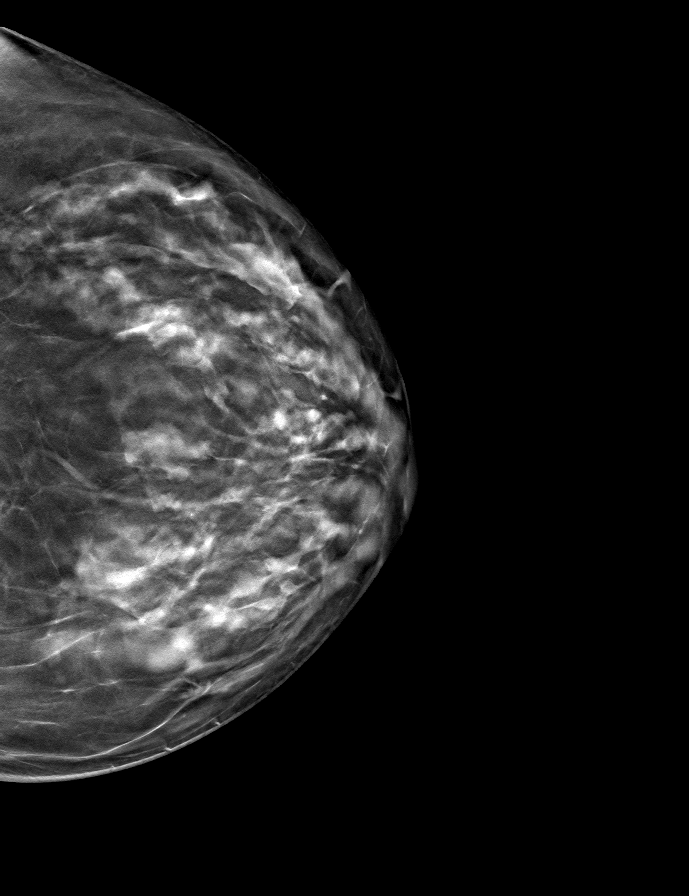

[R MLO tomo · tomo slice 35/70.0]
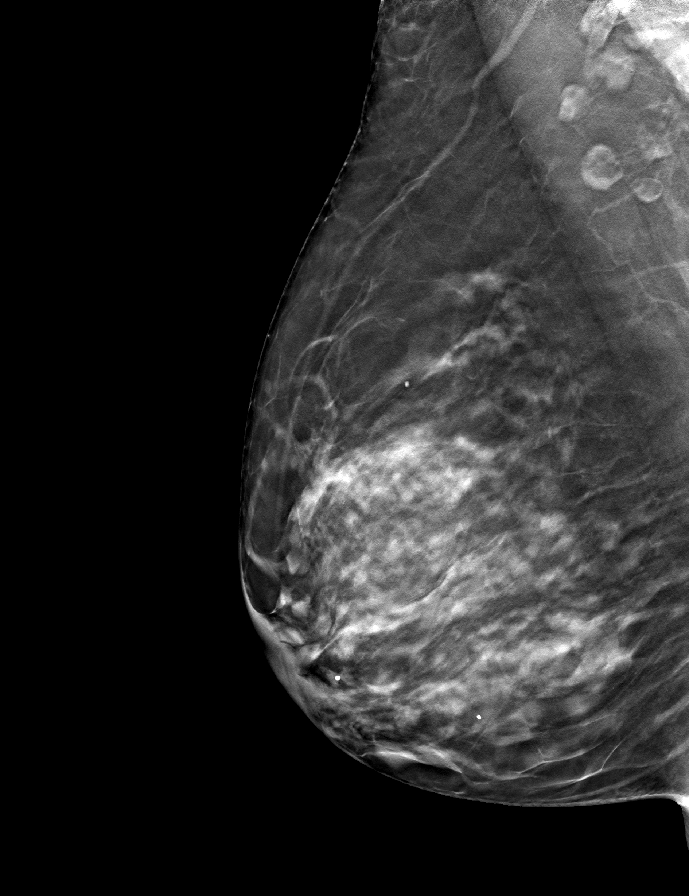

[9 of 24 positions shown; findings below may reference images not displayed]

ACR Breast Density Category c: The breast tissue is heterogeneously
dense, which may obscure small masses.
FINDINGS: There are no findings suspicious for malignancy. Images were
processed with CAD.
IMPRESSION: No mammographic evidence of malignancy. A result letter of this
screening mammogram will be mailed directly to the patient.

RECOMMENDATION:
Screening mammogram in one year. (Code:[id number])

BI-RADS CATEGORY  1: Negative.

## 2019-03-30 ENCOUNTER — Telehealth: Payer: Self-pay | Admitting: *Deleted

## 2019-03-30 NOTE — Telephone Encounter (Signed)
Copied from [place] [id number]. Topic: General - Other >> Mar 30, 2019 10:33 AM Leward Quan A wrote: Reason for CRM: Noemi with [employer] called to inquire about a form that was faxed over on 03/23/2019 to request testing supplies for the patient. She will fax form again today.Ph# [phone] for questions

## 2019-04-04 NOTE — Telephone Encounter (Signed)
Called patient to verify that she is using this company and she stated that she is using [facility] for her test strips, lancets, and a bottle of solution.  Paperwork placed in red folder. Please return when complete so I can fax. Thanks!

## 2019-04-21 ENCOUNTER — Ambulatory Visit: Payer: Medicare Other | Attending: Internal Medicine

## 2019-04-21 DIAGNOSIS — L821 Other seborrheic keratosis: Secondary | ICD-10-CM | POA: Diagnosis not present

## 2019-04-21 DIAGNOSIS — Z85828 Personal history of other malignant neoplasm of skin: Secondary | ICD-10-CM | POA: Diagnosis not present

## 2019-04-21 DIAGNOSIS — L820 Inflamed seborrheic keratosis: Secondary | ICD-10-CM | POA: Diagnosis not present

## 2019-04-21 DIAGNOSIS — Z23 Encounter for immunization: Secondary | ICD-10-CM | POA: Diagnosis not present

## 2019-04-21 DIAGNOSIS — D225 Melanocytic nevi of trunk: Secondary | ICD-10-CM | POA: Diagnosis not present

## 2019-04-21 DIAGNOSIS — L905 Scar conditions and fibrosis of skin: Secondary | ICD-10-CM | POA: Diagnosis not present

## 2019-04-21 DIAGNOSIS — D1801 Hemangioma of skin and subcutaneous tissue: Secondary | ICD-10-CM | POA: Diagnosis not present

## 2019-04-21 DIAGNOSIS — B078 Other viral warts: Secondary | ICD-10-CM | POA: Diagnosis not present

## 2019-04-21 NOTE — Progress Notes (Signed)
   Covid-19 Vaccination Clinic  Name:  [name]    MRN: [id number] DOB: 1940/12/08  04/21/2019  [name] was observed post Covid-19 immunization for 15 minutes without incidence. She was provided with Vaccine Information Sheet and instruction to access the V-Safe system.   [name] was instructed to call 911 with any severe reactions post vaccine: [name] Difficulty breathing  . Swelling of your face and throat  . A fast heartbeat  . A bad rash all over your body  . Dizziness and weakness    Immunizations Administered    Name Date Dose VIS Date Route   Pfizer COVID-19 Vaccine 04/21/2019  9:27 AM 0.3 mL 03/17/2019 Intramuscular   Manufacturer: [place]   Lot: [id number]   [place]: [id number]

## 2019-04-28 ENCOUNTER — Ambulatory Visit (INDEPENDENT_AMBULATORY_CARE_PROVIDER_SITE_OTHER): Payer: Medicare Other | Admitting: Family Medicine

## 2019-04-28 ENCOUNTER — Encounter: Payer: Self-pay | Admitting: Family Medicine

## 2019-04-28 ENCOUNTER — Other Ambulatory Visit: Payer: Self-pay

## 2019-04-28 VITALS — BP 102/68 | HR 65 | Temp 97.5°F | Wt 124.1 lb

## 2019-04-28 DIAGNOSIS — I7300 Raynaud's syndrome without gangrene: Secondary | ICD-10-CM

## 2019-04-28 DIAGNOSIS — E785 Hyperlipidemia, unspecified: Secondary | ICD-10-CM | POA: Diagnosis not present

## 2019-04-28 DIAGNOSIS — I10 Essential (primary) hypertension: Secondary | ICD-10-CM | POA: Diagnosis not present

## 2019-04-28 DIAGNOSIS — E119 Type 2 diabetes mellitus without complications: Secondary | ICD-10-CM

## 2019-04-28 DIAGNOSIS — M810 Age-related osteoporosis without current pathological fracture: Secondary | ICD-10-CM

## 2019-04-28 LAB — HEPATIC FUNCTION PANEL
ALT: 17 U/L (ref 0–35)
AST: 17 U/L (ref 0–37)
Albumin: 4.6 g/dL (ref 3.5–5.2)
Alkaline Phosphatase: 44 U/L (ref 39–117)
Bilirubin, Direct: 0.1 mg/dL (ref 0.0–0.3)
Total Bilirubin: 0.7 mg/dL (ref 0.2–1.2)
Total Protein: 6.6 g/dL (ref 6.0–8.3)

## 2019-04-28 LAB — LIPID PANEL
Cholesterol: 151 mg/dL (ref 0–200)
HDL: 59.2 mg/dL (ref >39.00)
LDL Cholesterol: 60 mg/dL (ref 0–99)
NonHDL: 92.17
Total CHOL/HDL Ratio: 3
Triglycerides: 163 mg/dL — ABNORMAL HIGH (ref 0.0–149.0)
VLDL: 32.6 mg/dL (ref 0.0–40.0)

## 2019-04-28 LAB — BASIC METABOLIC PANEL
BUN: 19 mg/dL (ref 6–23)
CO2: 29 mEq/L (ref 19–32)
Calcium: 9.8 mg/dL (ref 8.4–10.5)
Chloride: 104 mEq/L (ref 96–112)
Creatinine, Ser: 0.73 mg/dL (ref 0.40–1.20)
GFR: 77 mL/min (ref >60.00)
Glucose, Bld: 138 mg/dL — ABNORMAL HIGH (ref 70–99)
Potassium: 4.7 mEq/L (ref 3.5–5.1)
Sodium: 141 mEq/L (ref 135–145)

## 2019-04-28 LAB — HEMOGLOBIN A1C: Hgb A1c MFr Bld: 6.6 % — ABNORMAL HIGH (ref 4.6–6.5)

## 2019-04-28 MED ORDER — SIMVASTATIN 20 MG PO TABS: 20.0000 mg | ORAL_TABLET | Freq: Every day | ORAL | 3 refills | Status: DC

## 2019-04-28 MED ORDER — LISINOPRIL 10 MG PO TABS: 10.0000 mg | ORAL_TABLET | Freq: Every day | ORAL | 3 refills | Status: DC

## 2019-04-28 NOTE — Patient Instructions (Signed)
Raynaud Phenomenon ° °Raynaud phenomenon is a condition that affects the blood vessels (arteries) that carry blood to your fingers and toes. The arteries that supply blood to your ears, lips, nipples, or the tip of your nose might also be affected. Raynaud phenomenon causes the arteries to become narrow temporarily (spasm). As a result, the flow of blood to the affected areas is temporarily decreased. This usually occurs in response to cold temperatures or stress. During an attack, the skin in the affected areas turns white, then blue, and finally red. You may also feel tingling or numbness in those areas. °Attacks usually last for only a brief period, and then the blood flow to the area returns to normal. In most cases, Raynaud phenomenon does not cause serious health problems. °What are the causes? °In many cases, the cause of this condition is not known. The condition may occur on its own (primary Raynaud phenomenon) or may be associated with other diseases or factors (secondary Raynaud phenomenon). °Possible causes may include: °· Diseases or medical conditions that damage the arteries. °· Injuries and repetitive actions that hurt the hands or feet. °· Being exposed to certain chemicals. °· Taking medicines that narrow the arteries. °· Other medical conditions, such as lupus, scleroderma, rheumatoid arthritis, thyroid problems, blood disorders, Sjogren syndrome, or atherosclerosis. °What increases the risk? °The following factors may make you more likely to develop this condition: °· Being 20-40 years old. °· Being female. °· Having a family history of Raynaud phenomenon. °· Living in a cold climate. °· Smoking. °What are the signs or symptoms? °Symptoms of this condition usually occur when you are exposed to cold temperatures or when you have emotional stress. The symptoms may last for a few minutes or up to several hours. They usually affect your fingers but may also affect your toes, nipples, lips, ears, or  the tip of your nose. Symptoms may include: °· Changes in skin color. The skin in the affected areas will turn pale or white. The skin may then change from white to bluish to red as normal blood flow returns to the area. °· Numbness, tingling, or pain in the affected areas. °In severe cases, symptoms may include: °· Skin sores. °· Tissues decaying and dying (gangrene). °How is this diagnosed? °This condition may be diagnosed based on: °· Your symptoms and medical history. °· A physical exam. During the exam, you may be asked to put your hands in cold water to check for a reaction to cold temperature. °· Tests, such as: °? Blood tests to check for other diseases or conditions. °? A test to check the movement of blood through your arteries and veins (vascular ultrasound). °? A test in which the skin at the base of your fingernail is examined under a microscope (nailfold capillaroscopy). °How is this treated? °Treatment for this condition often involves making lifestyle changes and taking steps to control your exposure to cold temperatures. For more severe cases, medicine (calcium channel blockers) may be used to improve blood flow. Surgery is sometimes done to block the nerves that control the affected arteries, but this is rare. °Follow these instructions at home: °Avoiding cold temperatures °Take these steps to avoid exposure to cold: °· If possible, stay indoors during cold weather. °· When you go outside during cold weather, dress in layers and wear mittens, a hat, a scarf, and warm footwear. °· Wear mittens or gloves when handling ice or frozen food. °· Use holders for glasses or cans containing cold drinks. °·   Let warm water run for a while before taking a shower or bath. °· Warm up the car before driving in cold weather. °Lifestyle ° °· If possible, avoid stressful and emotional situations. Try to find ways to manage your stress, such as: °? Exercise. °? Yoga. °? Meditation. °? Biofeedback. °· Do not use any  products that contain nicotine or tobacco, such as cigarettes and e-cigarettes. If you need help quitting, ask your health care provider. °· Avoid secondhand smoke. °· Limit your use of caffeine. °? Switch to decaffeinated coffee, tea, and soda. °? Avoid chocolate. °· Avoid vibrating tools and machinery. °General instructions °· Protect your hands and feet from injuries, cuts, or bruises. °· Avoid wearing tight rings or wristbands. °· Wear loose fitting socks and comfortable, roomy shoes. °· Take over-the-counter and prescription medicines only as told by your health care provider. °Contact a health care provider if: °· Your discomfort becomes worse despite lifestyle changes. °· You develop sores on your fingers or toes that do not heal. °· Your fingers or toes turn black. °· You have breaks in the skin on your fingers or toes. °· You have a fever. °· You have pain or swelling in your joints. °· You have a rash. °· Your symptoms occur on only one side of your body. °Summary °· Raynaud phenomenon is a condition that affects the arteries that carry blood to your fingers, toes, ears, lips, nipples, or the tip of your nose. °· In many cases, the cause of this condition is not known. °· Symptoms of this condition include changes in skin color, and numbness and tingling of the affected area. °· Treatment for this condition includes lifestyle changes, reducing exposure to cold temperatures, and using medicines for severe cases of the condition. °· Contact your health care provider if your condition worsens despite treatment. °This information is not intended to replace advice given to you by your health care provider. Make sure you discuss any questions you have with your health care provider. °Document Revised: 03/26/2017 Document Reviewed: 05/04/2016 °Elsevier Patient Education © 2020 [employer] ° °

## 2019-04-28 NOTE — Progress Notes (Signed)
Subjective:     Patient ID: [name], female   DOB: Aug 20, 1940, 79 y.o.   MRN: [id number]  HPI [name] is seen for medical follow-up.  She has history of hypertension, GERD, type 2 diabetes, osteoporosis, hyperlipidemia.  She had off-site fracturing her femur when she was taking bisphosphonate and she has seen endocrinologist.  There is been recommendation apparently for Prolia but patient is concerned about taking this.  She is on low-dose estrogen patch per her gynecologist and is inquiring whether we can take that over.  She needs refills today of lisinopril and simvastatin.  She is compliant with therapy.  No recent dizziness or headaches.  No chest pains.  She has had interest in Raynaud's phenomenon involving her toes and fingers intermittently.  No major joint pains.  This occurs in cold weather and fingers become red or purplish with reperfusion.  No necrosis or gangrene type changes  Past Medical History:  Diagnosis Date  . Broken femur ([place])   . Cancer ([place]) 2001   skin, nose  . Cataract   . Diabetes mellitus    Borderline/no meds  . Diverticulosis of colon   . DNR (do not resuscitate) 2009  . GERD (gastroesophageal reflux disease)   . Hyperlipidemia   . Hypertension   . Kidney stones    1 time  . Migraines    during menopause  . Osteoporosis 09/2018   T score -2.7 stable from prior DEXA.  [name] Pneumothorax on left    Past Surgical History:  Procedure Laterality Date  . ABDOMINAL HYSTERECTOMY  1994   fibroids/ complete  . BUNIONECTOMY  2004   left  . CARPAL TUNNEL RELEASE  2000   right   . CATARACT EXTRACTION     Bil  . FEMUR FRACTURE SURGERY    . LUNG SURGERY  2008   left lung VATS for biopsy and removal of blebs with pleurodesis  . OOPHORECTOMY     BSO  . TONSILLECTOMY     as child    reports that she has never smoked. She has never used smokeless tobacco. She reports that she does not drink alcohol or use drugs. family history includes Cancer in her father,  mother, and sister; Diabetes in her sister; Emphysema in her maternal aunt; Hyperlipidemia in her sister; Ovarian cancer in her sister; Pulmonary embolism in her father. No Known Allergies   Review of Systems  Constitutional: Negative for appetite change, fatigue and unexpected weight change.  Eyes: Negative for visual disturbance.  Respiratory: Negative for cough, chest tightness, shortness of breath and wheezing.   Cardiovascular: Negative for chest pain, palpitations and leg swelling.  Gastrointestinal: Negative for abdominal pain.  Endocrine: Negative for polydipsia and polyuria.  Neurological: Negative for dizziness, seizures, syncope, weakness, light-headedness and headaches.       Objective:   Physical Exam Vitals reviewed.  Constitutional:      Appearance: Normal appearance.  Cardiovascular:     Rate and Rhythm: Normal rate and regular rhythm.  Pulmonary:     Effort: Pulmonary effort is normal.     Breath sounds: Normal breath sounds.  Skin:    Comments: Does have some Raynaud's involving the right third toe currently on exam.  Distal foot pulses in dorsalis pedis and posterior tibial are 1+  Neurological:     Mental Status: She is alert.        Assessment:     #1 hypertension stable and at goal  #2 hyperlipidemia treated with simvastatin  #  3 history of type 2 diabetes which has been diet controlled  #4 Raynaud's phenomenon involving various digits of the upper and lower extremities    Plan:     -Pain follow-up labs with lipid, hepatic, basic metabolic panel, [id number]  -Discussed possible low-dose calcium channel blocker for Raynaud's but at this point her symptoms are more of a nuisance and she would like to wait  -She had questions whether we could take over her low-dose estrogen patch.  She would like to be maintained on this because of her bone density issues.  She does not have any contraindications.  She does get regular mammograms.  We explained we be happy  to take this over  Eulas Post MD [facility] at [facility]

## 2019-05-11 ENCOUNTER — Ambulatory Visit: Payer: Medicare Other | Attending: Internal Medicine

## 2019-05-11 DIAGNOSIS — Z23 Encounter for immunization: Secondary | ICD-10-CM | POA: Insufficient documentation

## 2019-05-11 NOTE — Progress Notes (Signed)
   Covid-19 Vaccination Clinic  Name:  [name]    MRN: [id number] DOB: 12/02/1940  05/11/2019  [name] was observed post Covid-19 immunization for 15 minutes without incidence. She was provided with Vaccine Information Sheet and instruction to access the V-Safe system.   [name] was instructed to call 911 with any severe reactions post vaccine: [name] Difficulty breathing  . Swelling of your face and throat  . A fast heartbeat  . A bad rash all over your body  . Dizziness and weakness    Immunizations Administered    Name Date Dose VIS Date Route   Pfizer COVID-19 Vaccine 05/11/2019 11:36 AM 0.3 mL 03/17/2019 Intramuscular   Manufacturer: [place]   Lot: [id number]   [place]: [id number]

## 2019-06-06 ENCOUNTER — Ambulatory Visit: Payer: Medicare Other | Admitting: Internal Medicine

## 2019-06-16 ENCOUNTER — Encounter: Payer: Medicare Other | Admitting: Obstetrics and Gynecology

## 2019-07-28 ENCOUNTER — Ambulatory Visit (INDEPENDENT_AMBULATORY_CARE_PROVIDER_SITE_OTHER): Payer: Medicare Other | Admitting: Family Medicine

## 2019-07-28 ENCOUNTER — Encounter: Payer: Self-pay | Admitting: Family Medicine

## 2019-07-28 ENCOUNTER — Other Ambulatory Visit: Payer: Self-pay

## 2019-07-28 VITALS — BP 120/68 | HR 77 | Temp 97.6°F | Wt 123.2 lb

## 2019-07-28 DIAGNOSIS — E119 Type 2 diabetes mellitus without complications: Secondary | ICD-10-CM

## 2019-07-28 DIAGNOSIS — Z5181 Encounter for therapeutic drug level monitoring: Secondary | ICD-10-CM | POA: Diagnosis not present

## 2019-07-28 DIAGNOSIS — I10 Essential (primary) hypertension: Secondary | ICD-10-CM | POA: Diagnosis not present

## 2019-07-28 DIAGNOSIS — Z79890 Hormone replacement therapy (postmenopausal): Secondary | ICD-10-CM | POA: Diagnosis not present

## 2019-07-28 DIAGNOSIS — E785 Hyperlipidemia, unspecified: Secondary | ICD-10-CM

## 2019-07-28 LAB — POCT GLYCOSYLATED HEMOGLOBIN (HGB A1C): Hemoglobin A1C: 6.1 % — AB (ref 4.0–5.6)

## 2019-07-28 MED ORDER — ESTRADIOL 0.075 MG/24HR TD PTWK: 0.0750 mg | MEDICATED_PATCH | TRANSDERMAL | 12 refills | Status: DC

## 2019-07-28 NOTE — Progress Notes (Signed)
Subjective:     Patient ID: [name], female   DOB: 1940/06/02, 79 y.o.   MRN: [id number]  HPI [name] has history of type 2 diabetes, hypertension, history of migraine headaches, GERD, osteoporosis.  She has had prior history of femoral fracture on bisphosphonate.  She has not been walking much for exercise.  Her last A1c was 6.6%.  She had been poorly compliant with diet during pandemic.  She has tightened up things since then.  Blood sugar stable.  She is on simvastatin for hyperlipidemia.  Recent lipids reviewed with patient.  No myalgias.  She has hypertension treated with lisinopril.  Blood pressure stable.  No dizziness.  No recent headaches.  She is on topical estrogen replacement with Climara and is requesting refills.  She gets mammograms every summer.  Her GYN just retired  Past Medical History:  Diagnosis Date  . Broken femur ([place])   . Cancer ([place]) 2001   skin, nose  . Cataract   . Diabetes mellitus    Borderline/no meds  . Diverticulosis of colon   . DNR (do not resuscitate) 2009  . GERD (gastroesophageal reflux disease)   . Hyperlipidemia   . Hypertension   . Kidney stones    1 time  . Migraines    during menopause  . Osteoporosis 09/2018   T score -2.7 stable from prior DEXA.  [name] Pneumothorax on left    Past Surgical History:  Procedure Laterality Date  . ABDOMINAL HYSTERECTOMY  1994   fibroids/ complete  . BUNIONECTOMY  2004   left  . CARPAL TUNNEL RELEASE  2000   right   . CATARACT EXTRACTION     Bil  . FEMUR FRACTURE SURGERY    . LUNG SURGERY  2008   left lung VATS for biopsy and removal of blebs with pleurodesis  . OOPHORECTOMY     BSO  . TONSILLECTOMY     as child    reports that she has never smoked. She has never used smokeless tobacco. She reports that she does not drink alcohol or use drugs. family history includes Cancer in her father, mother, and sister; Diabetes in her sister; Emphysema in her maternal aunt; Hyperlipidemia in her sister;  Ovarian cancer in her sister; Pulmonary embolism in her father. No Known Allergies   Review of Systems  Constitutional: Negative for fatigue and unexpected weight change.  Eyes: Negative for visual disturbance.  Respiratory: Negative for cough, chest tightness, shortness of breath and wheezing.   Cardiovascular: Negative for chest pain, palpitations and leg swelling.  Endocrine: Negative for polydipsia and polyuria.  Neurological: Negative for dizziness, seizures, syncope, weakness, light-headedness and headaches.       Objective:   Physical Exam Constitutional:      Appearance: She is well-developed.  Eyes:     Pupils: Pupils are equal, round, and reactive to light.  Neck:     Thyroid: No thyromegaly.     Vascular: No JVD.  Cardiovascular:     Rate and Rhythm: Normal rate and regular rhythm.     Heart sounds: No gallop.   Pulmonary:     Effort: Pulmonary effort is normal. No respiratory distress.     Breath sounds: Normal breath sounds. No wheezing or rales.  Musculoskeletal:     Cervical back: Neck supple.     Right lower leg: No edema.     Left lower leg: No edema.  Neurological:     Mental Status: She is alert.  Assessment:     #1 type 2 diabetes very well controlled with A1c 6.1%  #2 hypertension stable and controlled on lisinopril  #3 hyperlipidemia treated with simvastatin with recent lipids at goal  #4 postmenopausal estrogen replacement therapy    Plan:     -Continue current medication regimen. -Refilled her Climara patch which she has been on for several years -Continue with yearly mammogram -We discussed importance of regular weightbearing exercise -Schedule 23-month follow-up and sooner as needed -We had a long discussion regarding pros and cons of estrogen replacement.  She would like to remain on this.  It would seem in looking over her situation that benefits outweigh potential risk.  She has no history of DVT or pulmonary emboli  Eulas Post MD [facility] at [facility]

## 2019-08-08 ENCOUNTER — Other Ambulatory Visit: Payer: Self-pay | Admitting: Family Medicine

## 2019-08-08 DIAGNOSIS — Z1231 Encounter for screening mammogram for malignant neoplasm of breast: Secondary | ICD-10-CM

## 2019-08-14 ENCOUNTER — Other Ambulatory Visit: Payer: Self-pay

## 2019-08-14 ENCOUNTER — Encounter: Payer: Self-pay | Admitting: Family Medicine

## 2019-08-14 ENCOUNTER — Ambulatory Visit (INDEPENDENT_AMBULATORY_CARE_PROVIDER_SITE_OTHER): Payer: Medicare Other | Admitting: Family Medicine

## 2019-08-14 VITALS — BP 106/60 | HR 73 | Temp 98.0°F | Wt 123.3 lb

## 2019-08-14 DIAGNOSIS — K5900 Constipation, unspecified: Secondary | ICD-10-CM | POA: Diagnosis not present

## 2019-08-14 DIAGNOSIS — K625 Hemorrhage of anus and rectum: Secondary | ICD-10-CM | POA: Diagnosis not present

## 2019-08-14 DIAGNOSIS — R1084 Generalized abdominal pain: Secondary | ICD-10-CM | POA: Diagnosis not present

## 2019-08-14 NOTE — Patient Instructions (Signed)
Constipation, Adult Constipation is when a person has fewer bowel movements in a week than normal, has difficulty having a bowel movement, or has stools that are dry, hard, or larger than normal. Constipation may be caused by an underlying condition. It may become worse with age if a person takes certain medicines and does not take in enough fluids. Follow these instructions at home: Eating and drinking   Eat foods that have a lot of fiber, such as fresh fruits and vegetables, whole grains, and beans.  Limit foods that are high in fat, low in fiber, or overly processed, such as french fries, hamburgers, cookies, candies, and soda.  Drink enough fluid to keep your urine clear or pale yellow. General instructions  Exercise regularly or as told by your health care provider.  Go to the restroom when you have the urge to go. Do not hold it in.  Take over-the-counter and prescription medicines only as told by your health care provider. These include any fiber supplements.  Practice pelvic floor retraining exercises, such as deep breathing while relaxing the lower abdomen and pelvic floor relaxation during bowel movements.  Watch your condition for any changes.  Keep all follow-up visits as told by your health care provider. This is important. Contact a health care provider if:  You have pain that gets worse.  You have a fever.  You do not have a bowel movement after 4 days.  You vomit.  You are not hungry.  You lose weight.  You are bleeding from the anus.  You have thin, pencil-like stools. Get help right away if:  You have a fever and your symptoms suddenly get worse.  You leak stool or have blood in your stool.  Your abdomen is bloated.  You have severe pain in your abdomen.  You feel dizzy or you faint. This information is not intended to replace advice given to you by your health care provider. Make sure you discuss any questions you have with your health care  provider. Document Revised: 03/05/2017 Document Reviewed: 09/11/2015 Elsevier Patient Education  2020 [place].  Follow up for any fever, recurrent abdominal pain, or recurrent rectal bleeding.

## 2019-08-14 NOTE — Progress Notes (Signed)
Subjective:     Patient ID: [name], female   DOB: 1940/05/20, 79 y.o.   MRN: [id number]  HPI   [name] has history of hypertension, diet-controlled type 2 diabetes, osteoporosis, and mixed restrictive and obstructive lung disease.  Seen today with some bilateral abdominal pain which she noted last night.  She has had some recent constipation.  This morning she strained some with bowel movement noted a little bit of bright red blood with wiping and in the toilet.  No exacerbating or alleviating factors  Her abdominal pain is actually better today.  Last night she felt like she may have had some mild subjective fever but none today.  No urinary symptoms.  Last colonoscopy was 2015 and did show right and left-sided diverticulosis.  She is not aware of any prior history of diverticulitis.  No recent appetite or weight changes.  She sometimes has alternating constipation and loose stools.  Past Medical History:  Diagnosis Date  . Broken femur ([place])   . Cancer ([place]) 2001   skin, nose  . Cataract   . Diabetes mellitus    Borderline/no meds  . Diverticulosis of colon   . DNR (do not resuscitate) 2009  . GERD (gastroesophageal reflux disease)   . Hyperlipidemia   . Hypertension   . Kidney stones    1 time  . Migraines    during menopause  . Osteoporosis 09/2018   T score -2.7 stable from prior DEXA.  [name] Pneumothorax on left    Past Surgical History:  Procedure Laterality Date  . ABDOMINAL HYSTERECTOMY  1994   fibroids/ complete  . BUNIONECTOMY  2004   left  . CARPAL TUNNEL RELEASE  2000   right   . CATARACT EXTRACTION     Bil  . FEMUR FRACTURE SURGERY    . LUNG SURGERY  2008   left lung VATS for biopsy and removal of blebs with pleurodesis  . OOPHORECTOMY     BSO  . TONSILLECTOMY     as child    reports that she has never smoked. She has never used smokeless tobacco. She reports that she does not drink alcohol or use drugs. family history includes Cancer in her father,  mother, and sister; Diabetes in her sister; Emphysema in her maternal aunt; Hyperlipidemia in her sister; Ovarian cancer in her sister; Pulmonary embolism in her father. No Known Allergies  Wt Readings from Last 3 Encounters:  08/14/19 123 lb 4.8 oz (55.9 kg)  07/28/19 123 lb 3.2 oz (55.9 kg)  04/28/19 124 lb 1.6 oz (56.3 kg)     Review of Systems  Constitutional: Negative for appetite change, chills, fever and unexpected weight change.  Respiratory: Negative for cough and shortness of breath.   Cardiovascular: Negative for chest pain.  Gastrointestinal: Positive for abdominal pain, anal bleeding and constipation. Negative for nausea and vomiting.  Genitourinary: Negative for dysuria.  Neurological: Negative for dizziness.       Objective:   Physical Exam Vitals reviewed.  Constitutional:      Appearance: Normal appearance.  Cardiovascular:     Rate and Rhythm: Normal rate and regular rhythm.  Pulmonary:     Effort: Pulmonary effort is normal.     Breath sounds: Normal breath sounds.  Abdominal:     General: Abdomen is flat. Bowel sounds are normal.     Palpations: There is no mass.     Tenderness: There is no abdominal tenderness. There is no guarding or rebound.  Genitourinary:  Comments: Rectal exam reveals small skin tag around 3 o'clock position.  Very small split in the skin around the 7 o'clock position.  No active bleeding.  Digital exam reveals no rectal masses.  Brown stool. Neurological:     Mental Status: She is alert.        Assessment:     #1 transient abdominal pain bilaterally last night.  Question related to constipation.  Doubt acute diverticulitis.  Abdominal pain symptoms are improved today.  She does describe some bright red blood per rectum also which may have been related to her straining.  #2 chronic intermittent constipation    Plan:     -Check CBC with differential -Discussed measures to reduce constipation -Plenty of fluid, at least 25  g fiber per day, increased exercise and activity and consider as needed use of MiraLAX -Follow-up immediately for any fever, recurrent abdominal pain, or recurrent bloody stools  Eulas Post MD [facility] at [facility]

## 2019-08-15 LAB — CBC WITH DIFFERENTIAL/PLATELET
Basophils Absolute: 0 10*3/uL (ref 0.0–0.1)
Basophils Relative: 0.7 % (ref 0.0–3.0)
Eosinophils Absolute: 0.1 10*3/uL (ref 0.0–0.7)
Eosinophils Relative: 1.5 % (ref 0.0–5.0)
HCT: 40 % (ref 36.0–46.0)
Hemoglobin: 13.6 g/dL (ref 12.0–15.0)
Lymphocytes Relative: 21.2 % (ref 12.0–46.0)
Lymphs Abs: 1.5 10*3/uL (ref 0.7–4.0)
MCHC: 33.9 g/dL (ref 30.0–36.0)
MCV: 97.3 fl (ref 78.0–100.0)
Monocytes Absolute: 0.5 10*3/uL (ref 0.1–1.0)
Monocytes Relative: 7.6 % (ref 3.0–12.0)
Neutro Abs: 5 10*3/uL (ref 1.4–7.7)
Neutrophils Relative %: 69 % (ref 43.0–77.0)
Platelets: 273 10*3/uL (ref 150.0–400.0)
RBC: 4.11 Mil/uL (ref 3.87–5.11)
RDW: 13.6 % (ref 11.5–15.5)
WBC: 7.2 10*3/uL (ref 4.0–10.5)

## 2019-08-17 ENCOUNTER — Encounter: Payer: Self-pay | Admitting: Internal Medicine

## 2019-08-17 DIAGNOSIS — E119 Type 2 diabetes mellitus without complications: Secondary | ICD-10-CM | POA: Diagnosis not present

## 2019-09-28 ENCOUNTER — Other Ambulatory Visit: Payer: Self-pay

## 2019-09-28 ENCOUNTER — Ambulatory Visit
Admission: RE | Admit: 2019-09-28 | Discharge: 2019-09-28 | Disposition: A | Discharge status: Home / Self Care | Payer: Medicare Other | Source: Ambulatory Visit | Attending: Family Medicine | Admitting: Family Medicine

## 2019-09-28 ENCOUNTER — Ambulatory Visit (AMBULATORY_SURGERY_CENTER): Payer: Self-pay | Admitting: *Deleted

## 2019-09-28 VITALS — Ht 60.0 in | Wt 125.0 lb

## 2019-09-28 DIAGNOSIS — Z86010 Personal history of colonic polyps: Secondary | ICD-10-CM

## 2019-09-28 DIAGNOSIS — Z1231 Encounter for screening mammogram for malignant neoplasm of breast: Secondary | ICD-10-CM

## 2019-09-28 MED ORDER — SUTAB 1479-225-188 MG PO TABS: 1.0000 | ORAL_TABLET | ORAL | 0 refills | Status: DC

## 2019-09-28 NOTE — Progress Notes (Signed)
Patient is here in-person for PV. Patient denies any allergies to eggs or soy. Patient denies any problems with anesthesia/sedation. Patient denies any oxygen use at home. Patient denies taking any diet/weight loss medications or blood thinners. Patient is not being treated for MRSA or C-diff. Patient is aware of our care-partner policy and QQIWL-79 safety protocol. Completed covid vaccines 05/11/19 per pt.Sutab Prep Prescription coupon given to the patient.

## 2019-10-04 ENCOUNTER — Other Ambulatory Visit: Payer: Self-pay | Admitting: Family Medicine

## 2019-10-04 DIAGNOSIS — R928 Other abnormal and inconclusive findings on diagnostic imaging of breast: Secondary | ICD-10-CM

## 2019-10-07 DIAGNOSIS — R0789 Other chest pain: Secondary | ICD-10-CM | POA: Diagnosis not present

## 2019-10-07 DIAGNOSIS — R079 Chest pain, unspecified: Secondary | ICD-10-CM | POA: Diagnosis not present

## 2019-10-10 ENCOUNTER — Telehealth: Payer: Self-pay

## 2019-10-10 NOTE — Telephone Encounter (Signed)
Given that she has had a formal medical evaluation and her symptom complex has resolved, okay to proceed as planned.  However, any further difficulties, she should see her PCP

## 2019-10-10 NOTE — Telephone Encounter (Signed)
Spoke with pt and she is aware.

## 2019-10-10 NOTE — Telephone Encounter (Signed)
Pt states she had pain over the weekend and had to call 911. States she had pain in her Left neck and shoulder, she called 911 because she googled and read that it could be heart related. EMT came and took her BP and pulse which were 130/76, pulse was 76. EKG was normal. The pain was gone on Monday. Pt just wanted to make sure she will still be ok for the procedure scheduled on 7/8. Please advise.

## 2019-10-12 ENCOUNTER — Ambulatory Visit (AMBULATORY_SURGERY_CENTER): Payer: Medicare Other | Admitting: Internal Medicine

## 2019-10-12 ENCOUNTER — Other Ambulatory Visit: Payer: Self-pay

## 2019-10-12 ENCOUNTER — Encounter: Payer: Self-pay | Admitting: Internal Medicine

## 2019-10-12 VITALS — BP 121/53 | HR 65 | Temp 97.7°F | Resp 18 | Ht 60.0 in | Wt 125.0 lb

## 2019-10-12 DIAGNOSIS — D120 Benign neoplasm of cecum: Secondary | ICD-10-CM

## 2019-10-12 DIAGNOSIS — Z86010 Personal history of colonic polyps: Secondary | ICD-10-CM | POA: Diagnosis present

## 2019-10-12 MED ORDER — SODIUM CHLORIDE 0.9 % IV SOLN: 500.0000 mL | Freq: Once | INTRAVENOUS | Status: DC

## 2019-10-12 NOTE — Op Note (Signed)
[facility] Patient Name: [name] Procedure Date: 10/12/2019 11:35 AM MRN: [id number] Endoscopist: Docia Chuck. Henrene Pastor , MD Age: 79 Referring MD:  Date of Birth: 1940-11-09 Gender: Female Account #: [id number] Procedure:                Colonoscopy Indications:              High risk colon cancer surveillance: Personal                            history of non-advanced adenoma. Previous                            examinations 2005, 2015 Medicines:                Monitored Anesthesia Care Procedure:                Pre-Anesthesia Assessment:                           - Prior to the procedure, a History and Physical                            was performed, and patient medications and                            allergies were reviewed. The patient's tolerance of                            previous anesthesia was also reviewed. The risks                            and benefits of the procedure and the sedation                            options and risks were discussed with the patient.                            All questions were answered, and informed consent                            was obtained. Prior Anticoagulants: The patient has                            taken no previous anticoagulant or antiplatelet                            agents. ASA Grade Assessment: II - A patient with                            mild systemic disease. After reviewing the risks                            and benefits, the patient was deemed in  satisfactory condition to undergo the procedure.                           After obtaining informed consent, the colonoscope                            was passed under direct vision. Throughout the                            procedure, the patient's blood pressure, pulse, and                            oxygen saturations were monitored continuously. The                            Colonoscope was introduced through the anus and                             advanced to the the cecum, identified by                            appendiceal orifice and ileocecal valve. The                            ileocecal valve, appendiceal orifice, and rectum                            were photographed. The quality of the bowel                            preparation was excellent. The colonoscopy was                            performed without difficulty. The patient tolerated                            the procedure well. The bowel preparation used was                            SUPREP via split dose instruction. Scope In: 11:42:59 AM Scope Out: 11:56:15 AM Scope Withdrawal Time: 0 hours 9 minutes 51 seconds  Total Procedure Duration: 0 hours 13 minutes 16 seconds  Findings:                 A 2 mm polyp was found in the cecum. The polyp was                            sessile. The polyp was removed with a cold snare.                            Resection and retrieval were complete.                           Multiple diverticula were found in the sigmoid  colon and right colon. Moderate internal                            hemorrhoids.                           The exam was otherwise without abnormality on                            direct and retroflexion views. Complications:            No immediate complications. Estimated blood loss:                            None. Estimated Blood Loss:     Estimated blood loss: none. Impression:               - One 2 mm polyp in the cecum, removed with a cold                            snare. Resected and retrieved.                           - Diverticulosis in the sigmoid colon and in the                            right colon.                           - The examination was otherwise normal on direct                            and retroflexion views. Recommendation:           - Repeat colonoscopy is not recommended for                            surveillance.                            - Patient has a contact number available for                            emergencies. The signs and symptoms of potential                            delayed complications were discussed with the                            patient. Return to normal activities tomorrow.                            Written discharge instructions were provided to the                            patient.                           -  Resume previous diet.                           - Continue present medications.                           - Await pathology results. Docia Chuck. Henrene Pastor, MD 10/12/2019 12:02:43 PM This report has been signed electronically.

## 2019-10-12 NOTE — Patient Instructions (Signed)
Information on polyps and diverticulosis given to you today.  Await pathology results.  Resume previous diet and medications.  YOU HAD AN ENDOSCOPIC PROCEDURE TODAY AT THE [facility]:   Refer to the procedure report that was given to you for any specific questions about what was found during the examination.  If the procedure report does not answer your questions, please call your gastroenterologist to clarify.  If you requested that your care partner not be given the details of your procedure findings, then the procedure report has been included in a sealed envelope for you to review at your convenience later.  YOU SHOULD EXPECT: Some feelings of bloating in the abdomen. Passage of more gas than usual.  Walking can help get rid of the air that was put into your GI tract during the procedure and reduce the bloating. If you had a lower endoscopy (such as a colonoscopy or flexible sigmoidoscopy) you may notice spotting of blood in your stool or on the toilet paper. If you underwent a bowel prep for your procedure, you may not have a normal bowel movement for a few days.  Please Note:  You might notice some irritation and congestion in your nose or some drainage.  This is from the oxygen used during your procedure.  There is no need for concern and it should clear up in a day or so.  SYMPTOMS TO REPORT IMMEDIATELY:   Following lower endoscopy (colonoscopy or flexible sigmoidoscopy):  Excessive amounts of blood in the stool  Significant tenderness or worsening of abdominal pains  Swelling of the abdomen that is new, acute  Fever of 100F or higher   For urgent or emergent issues, a gastroenterologist can be reached at any hour by calling [phone]. Do not use MyChart messaging for urgent concerns.    DIET:  We do recommend a small meal at first, but then you may proceed to your regular diet.  Drink plenty of fluids but you should avoid alcoholic beverages for 24  hours.  ACTIVITY:  You should plan to take it easy for the rest of today and you should NOT DRIVE or use heavy machinery until tomorrow (because of the sedation medicines used during the test).    FOLLOW UP: Our staff will call the number listed on your records 48-72 hours following your procedure to check on you and address any questions or concerns that you may have regarding the information given to you following your procedure. If we do not reach you, we will leave a message.  We will attempt to reach you two times.  During this call, we will ask if you have developed any symptoms of COVID 19. If you develop any symptoms (ie: fever, flu-like symptoms, shortness of breath, cough etc.) before then, please call [phone].  If you test positive for Covid 19 in the 2 weeks post procedure, please call and report this information to us.    If any biopsies were taken you will be contacted by phone or by letter within the next 1-3 weeks.  Please call us at [phone] if you have not heard about the biopsies in 3 weeks.    SIGNATURES/CONFIDENTIALITY: You and/or your care partner have signed paperwork which will be entered into your electronic medical record.  These signatures attest to the fact that that the information above on your After Visit Summary has been reviewed and is understood.  Full responsibility of the confidentiality of this discharge information lies with you and/or   your care-partner. 

## 2019-10-12 NOTE — Progress Notes (Signed)
Pt's states no medical or surgical changes since previsit or office visit.  CW 0 vitals

## 2019-10-12 NOTE — Progress Notes (Signed)
A/ox3, pleased with MAC, report to RN 

## 2019-10-12 NOTE — Progress Notes (Signed)
Called to room to assist during endoscopic procedure.  Patient ID and intended procedure confirmed with present staff. Received instructions for my participation in the procedure from the performing physician.  

## 2019-10-16 ENCOUNTER — Telehealth: Payer: Self-pay

## 2019-10-16 NOTE — Telephone Encounter (Signed)
  Follow up Call-  Call back number 10/12/2019  Post procedure Call Back phone  # [phone]  Permission to leave phone message Yes  Some recent data might be hidden     Patient questions:  Do you have a fever, pain , or abdominal swelling? No. Pain Score  0 *  Have you tolerated food without any problems? Yes.    Have you been able to return to your normal activities? Yes.    Do you have any questions about your discharge instructions: Diet   No. Medications  No. Follow up visit  No.  Do you have questions or concerns about your Care? No.  Actions: * If pain score is 4 or above: No action needed, pain <4.  1. Have you developed a fever since your procedure? no  2.   Have you had an respiratory symptoms (SOB or cough) since your procedure? no  3.   Have you tested positive for COVID 19 since your procedure no  4.   Have you had any family members/close contacts diagnosed with the COVID 19 since your procedure?  no   If yes to any of these questions please route to Joylene John, RN and Erenest Rasher, RN

## 2019-10-18 ENCOUNTER — Ambulatory Visit
Admission: RE | Admit: 2019-10-18 | Discharge: 2019-10-18 | Disposition: A | Discharge status: Home / Self Care | Payer: Medicare Other | Source: Ambulatory Visit | Attending: Family Medicine | Admitting: Family Medicine

## 2019-10-18 ENCOUNTER — Ambulatory Visit: Payer: Medicare Other

## 2019-10-18 DIAGNOSIS — R922 Inconclusive mammogram: Secondary | ICD-10-CM | POA: Diagnosis not present

## 2019-10-18 DIAGNOSIS — R928 Other abnormal and inconclusive findings on diagnostic imaging of breast: Secondary | ICD-10-CM

## 2019-10-20 ENCOUNTER — Encounter: Payer: Self-pay | Admitting: Internal Medicine

## 2019-10-27 ENCOUNTER — Ambulatory Visit: Payer: Medicare Other | Admitting: Family Medicine

## 2019-11-07 DIAGNOSIS — Z85828 Personal history of other malignant neoplasm of skin: Secondary | ICD-10-CM | POA: Diagnosis not present

## 2019-11-07 DIAGNOSIS — L820 Inflamed seborrheic keratosis: Secondary | ICD-10-CM | POA: Diagnosis not present

## 2019-11-07 DIAGNOSIS — L905 Scar conditions and fibrosis of skin: Secondary | ICD-10-CM | POA: Diagnosis not present

## 2019-11-07 DIAGNOSIS — L439 Lichen planus, unspecified: Secondary | ICD-10-CM | POA: Diagnosis not present

## 2019-11-07 DIAGNOSIS — D485 Neoplasm of uncertain behavior of skin: Secondary | ICD-10-CM | POA: Diagnosis not present

## 2019-11-27 DIAGNOSIS — Z20822 Contact with and (suspected) exposure to covid-19: Secondary | ICD-10-CM | POA: Diagnosis not present

## 2019-12-07 ENCOUNTER — Telehealth: Payer: Self-pay | Admitting: *Deleted

## 2019-12-07 NOTE — Telephone Encounter (Signed)
Error

## 2019-12-13 ENCOUNTER — Telehealth: Payer: Self-pay | Admitting: Family Medicine

## 2019-12-13 NOTE — Progress Notes (Signed)
  Chronic Care Management   Note  12/13/2019 Name: JENNETT TARBELL MRN: [id number] DOB: 07-Apr-1940  LASHUNTA FRIEDEN is a 79 y.o. year old female who is a primary care patient of Burchette, Alinda Sierras, MD. I reached out to Gloriajean Dell by phone today in response to a referral sent by Ms. Cheron Every PCP, Eulas Post, MD.   Ms. Bautch was given information about Chronic Care Management services today including:  1. CCM service includes personalized support from designated clinical staff supervised by her physician, including individualized plan of care and coordination with other care providers 2. 24/7 contact phone numbers for assistance for urgent and routine care needs. 3. Service will only be billed when office clinical staff spend 20 minutes or more in a month to coordinate care. 4. Only one practitioner may furnish and bill the service in a calendar month. 5. The patient may stop CCM services at any time (effective at the end of the month) by phone call to the office staff.   Patient agreed to services and verbal consent obtained.   Follow up plan:   Carley Perdue [employer] Scheduler

## 2019-12-18 ENCOUNTER — Other Ambulatory Visit: Payer: Self-pay

## 2019-12-18 ENCOUNTER — Ambulatory Visit (INDEPENDENT_AMBULATORY_CARE_PROVIDER_SITE_OTHER): Payer: Medicare Other

## 2019-12-18 DIAGNOSIS — Z0000 Encounter for general adult medical examination without abnormal findings: Secondary | ICD-10-CM | POA: Diagnosis not present

## 2019-12-18 NOTE — Progress Notes (Signed)
Subjective:   [name] is a 79 y.o. female who presents for Medicare Annual (Subsequent) preventive examination.  I connected with [name] today by telephone and verified that I am speaking with the correct person using two identifiers. Location patient: home Location provider: work Persons participating in the virtual visit: patient, provider.   I discussed the limitations, risks, security and privacy concerns of performing an evaluation and management service by telephone and the availability of in person appointments. I also discussed with the patient that there may be a patient responsible charge related to this service. The patient expressed understanding and verbally consented to this telephonic visit.    Interactive audio and video telecommunications were attempted between this provider and patient, however failed, due to patient having technical difficulties OR patient did not have access to video capability.  We continued and completed visit with audio only.     Review of Systems    N/A Cardiac Risk Factors include: advanced age (>65men, >45 women);diabetes mellitus;hypertension     Objective:    Today's Vitals   There is no height or weight on file to calculate BMI.  Advanced Directives 12/18/2019 12/02/2016  Does Patient Have a Medical Advance Directive? Yes Yes  Type of Paramedic of [place];Living will -  Does patient want to make changes to medical advance directive? No - Patient declined -  Copy of [place] in Chart? Yes - validated most recent copy scanned in chart (See row information) -    Current Medications (verified) Outpatient Encounter Medications as of 12/18/2019  Medication Sig  . APPLE CIDER VINEGAR PO Take by mouth.  [name] aspirin EC 81 MG tablet Take 81 mg by mouth daily.  . Calcium Citrate-Vitamin D (CALCIUM CITRATE + D3 PO) 800mg  calcium 1000 IU Vit. D. Take by mouth daily.  . Chromium-Cinnamon  (CINNAMON PLUS CHROMIUM PO) 2000 mg cinnamon 200 mcg Chromium Take by mouth daily.  . cycloSPORINE (RESTASIS) 0.05 % ophthalmic emulsion Place 1 drop into both eyes 2 (two) times daily.  [name] estradiol (CLIMARA - DOSED IN MG/24 HR) 0.075 mg/24hr patch Place 1 patch (0.075 mg total) onto the skin once a week.  [name] glucose blood test strip Use as instructed  . lisinopril (ZESTRIL) 10 MG tablet Take 1 tablet (10 mg total) by mouth daily. Per Dr Elease Hashimoto  . MAGNESIUM PO Take 250 mg by mouth daily.  . Multiple Vitamin (MULTIVITAMIN PO) Take 1 tablet by mouth daily.   [name] OVER THE COUNTER MEDICATION Omega - 3 1040 mg take one tab daily  . simvastatin (ZOCOR) 20 MG tablet Take 1 tablet (20 mg total) by mouth daily.  [name] Ultilet Classic Lancets MISC Testing BS once daily   Facility-Administered Encounter Medications as of 12/18/2019  Medication  . 0.9 %  sodium chloride infusion    Allergies (verified) Patient has no known allergies.   History: Past Medical History:  Diagnosis Date  . Broken femur ([place])   . Cancer ([place]) 2001   skin, nose  . Cataract   . Diabetes mellitus    Borderline/no meds  . Diverticulosis of colon   . DNR (do not resuscitate) 2009  . GERD (gastroesophageal reflux disease)   . Heart murmur   . Hyperlipidemia   . Hypertension   . Kidney stones    1 time  . Migraines    during menopause  . Osteoporosis 09/2018   T score -2.7 stable from prior DEXA.  [name]  Pneumothorax on left    Past Surgical History:  Procedure Laterality Date  . ABDOMINAL HYSTERECTOMY  1994   fibroids/ complete  . BUNIONECTOMY  2004   left  . CARPAL TUNNEL RELEASE  2000   right   . CATARACT EXTRACTION     Bil  . COLONOSCOPY  10/24/2013  . FEMUR FRACTURE SURGERY    . LUNG SURGERY  2008   left lung VATS for biopsy and removal of blebs with pleurodesis  . OOPHORECTOMY     BSO  . POLYPECTOMY    . TONSILLECTOMY     as child   Family History  Problem Relation Age of Onset  . Cancer Mother          bone  . Diabetes Sister   . Hyperlipidemia Sister   . Ovarian cancer Sister   . Pulmonary embolism Father   . Cancer Father        Facial  . Cancer Sister        melanoma  . Emphysema Maternal Aunt   . Breast cancer Neg Hx   . Colon cancer Neg Hx   . Colon polyps Neg Hx   . Esophageal cancer Neg Hx   . Rectal cancer Neg Hx   . Stomach cancer Neg Hx    Social History   Socioeconomic History  . Marital status: Divorced    Spouse name: Not on file  . Number of children: Not on file  . Years of education: Not on file  . Highest education level: Not on file  Occupational History  . Not on file  Tobacco Use  . Smoking status: Never Smoker  . Smokeless tobacco: Never Used  Vaping Use  . Vaping Use: Never used  Substance and Sexual Activity  . Alcohol use: No    Alcohol/week: 0.0 standard drinks  . Drug use: No  . Sexual activity: Not Currently    Comment: 1st intercourse 79 yo-Fewer than 5 partners  Other Topics Concern  . Not on file  Social History Narrative  . Not on file   Social Determinants of Health   Financial Resource Strain: Low Risk   . Difficulty of Paying Living Expenses: Not hard at all  Food Insecurity: No Food Insecurity  . Worried About Charity fundraiser in the Last Year: Never true  . Ran Out of Food in the Last Year: Never true  Transportation Needs: No Transportation Needs  . Lack of Transportation (Medical): No  . Lack of Transportation (Non-Medical): No  Physical Activity: Inactive  . Days of Exercise per Week: 0 days  . Minutes of Exercise per Session: 0 min  Stress: No Stress Concern Present  . Feeling of Stress : Not at all  Social Connections: Moderately Isolated  . Frequency of Communication with Friends and Family: More than three times a week  . Frequency of Social Gatherings with Friends and Family: Once a week  . Attends Religious Services: More than 4 times per year  . Active Member of Clubs or Organizations: No  .  Attends Archivist Meetings: Never  . Marital Status: Divorced    Tobacco Counseling Counseling given: Not Answered   Clinical Intake:  Pre-visit preparation completed: Yes  Pain : No/denies pain     Nutritional Risks: None Diabetes: Yes (Patient states checks glucose every morning) CBG done?: No Did pt. bring in CBG monitor from home?: No  How often do you need to have someone help you when you  read instructions, pamphlets, or other written materials from your doctor or pharmacy?: 1 - Never What is the last grade level you completed in school?: High School  Diabetic?Yes  Interpreter Needed?: No  Information entered by :: [place] of Daily Living In your present state of health, do you have any difficulty performing the following activities: 12/18/2019  Hearing? N  Vision? N  Difficulty concentrating or making decisions? N  Walking or climbing stairs? N  Dressing or bathing? N  Doing errands, shopping? N  Preparing Food and eating ? N  Using the Toilet? N  In the past six months, have you accidently leaked urine? Y  Comment has diminished bladder issues has occasional leakage  Do you have problems with loss of bowel control? N  Managing your Medications? N  Managing your Finances? N  Housekeeping or managing your Housekeeping? N  Some recent data might be hidden    Patient Care Team: Eulas Post, MD as PCP - General (Family Medicine) Erskin Burnet, [facility] as Pharmacist (Pharmacist)  Indicate any recent Medical Services you may have received from other than [facility] providers in the past year (date may be approximate).     Assessment:   This is a routine wellness examination for Candis.  Hearing/Vision screen  Hearing Screening   125Hz  250Hz  500Hz  1000Hz  2000Hz  3000Hz  4000Hz  6000Hz  8000Hz   Right ear:           Left ear:           Comments: Patient states gets eyes checked once per year   Dietary issues and exercise activities  discussed: Current Exercise Habits: The patient does not participate in regular exercise at present, Exercise limited by: orthopedic condition(s)  Goals    . Exercise 150 minutes per week (moderate activity)     Will walk more  Watch unhealthy carbs       Depression Screen PHQ 2/9 Scores 12/18/2019 07/28/2019 04/29/2018 12/02/2016 06/19/2016 10/23/2015 04/24/2015  PHQ - 2 Score 0 0 0 0 0 0 0  PHQ- 9 Score 0 - 0 - - - -    Fall Risk Fall Risk  12/18/2019 07/28/2019 04/29/2018 12/02/2016 06/19/2016  Falls in the past year? 0 0 1 No No  Number falls in past yr: 0 0 1 - -  Injury with Fall? 0 0 1 - -  Risk for fall due to : Medication side effect - - - -  Follow up Falls evaluation completed;Falls prevention discussed Falls evaluation completed - - -    Any stairs in or around the home? Yes  If so, are there any without handrails? No  Home free of loose throw rugs in walkways, pet beds, electrical cords, etc? Yes  Adequate lighting in your home to reduce risk of falls? Yes   ASSISTIVE DEVICES UTILIZED TO PREVENT FALLS:  Life alert? No  Use of a cane, walker or w/c? No  Grab bars in the bathroom? Yes  Shower chair or bench in shower? No  Elevated toilet seat or a handicapped toilet? No    Cognitive Function:    Cognitive screening not indicated based on direct observation    Immunizations Immunization History  Administered Date(s) Administered  . Fluad Quad(high Dose 65+) 12/15/2018  . Influenza Split 01/01/2012  . Influenza Whole 12/27/2007, 12/06/2010  . Influenza, High Dose Seasonal PF 01/11/2013, 12/07/2014, 12/06/2015, 12/02/2016  . Influenza,inj,Quad PF,6+ Mos 12/28/2013  . Influenza-Unspecified 12/01/2016, 12/28/2016, 12/15/2018  . PFIZER SARS-COV-2 Vaccination 04/21/2019, 05/11/2019  .  Pneumococcal Conjugate-13 04/06/2006, 10/11/2013  . Pneumococcal Polysaccharide-23 04/28/2016  . Tdap 01/29/2017  . Zoster 03/28/2007  . Zoster Recombinat (Shingrix) 10/26/2018,  10/26/2018, 12/30/2018, 12/30/2018    TDAP status: Up to date Flu Vaccine status: Up to date Pneumococcal vaccine status: Up to date Covid-19 vaccine status: Completed vaccines  Qualifies for Shingles Vaccine? Yes   Zostavax completed Yes   Shingrix Completed?: Yes  Screening Tests Health Maintenance  Topic Date Due  . Hepatitis C Screening  Never done  . OPHTHALMOLOGY EXAM  05/26/2019  . INFLUENZA VACCINE  11/05/2019  . HEMOGLOBIN A1C  01/27/2020  . FOOT EXAM  04/27/2020  . COLONOSCOPY  10/11/2024  . TETANUS/TDAP  01/30/2027  . DEXA SCAN  Completed  . COVID-19 Vaccine  Completed  . PNA vac Low Risk Adult  Completed    Health Maintenance  Health Maintenance Due  Topic Date Due  . Hepatitis C Screening  Never done  . OPHTHALMOLOGY EXAM  05/26/2019  . INFLUENZA VACCINE  11/05/2019    Colorectal cancer screening: No longer required.  Mammogram status: Completed 10/18/2019. Repeat every year Bone Density status: Completed 09/23/2018. Results reflect: Bone density results: OSTEOPOROSIS. Repeat every 2 years.  Lung Cancer Screening: (Low Dose CT Chest recommended if Age 35-80 years, 30 pack-year currently smoking OR have quit w/in 15years.) does not qualify.   Lung Cancer Screening Referral: N/A  Additional Screening:  Hepatitis C Screening: does not qualify;   Vision Screening: Recommended annual ophthalmology exams for early detection of glaucoma and other disorders of the eye. Is the patient up to date with their annual eye exam?  Yes  Who is the provider or what is the name of the office in which the patient attends annual eye exams? Dr. Macarthur Critchley If pt is not established with a provider, would they like to be referred to a provider to establish care? No .   Dental Screening: Recommended annual dental exams for proper oral hygiene  Community Resource Referral / Chronic Care Management: CRR required this visit?  No   CCM required this visit?  No      Plan:       I have personally reviewed and noted the following in the patient's chart:   . Medical and social history . Use of alcohol, tobacco or illicit drugs  . Current medications and supplements . Functional ability and status . Nutritional status . Physical activity . Advanced directives . List of other physicians . Hospitalizations, surgeries, and ER visits in previous 12 months . Vitals . Screenings to include cognitive, depression, and falls . Referrals and appointments  In addition, I have reviewed and discussed with patient certain preventive protocols, quality metrics, and best practice recommendations. A written personalized care plan for preventive services as well as general preventive health recommendations were provided to patient.     Ofilia Neas, LPN   05/06/8655   Nurse Notes: None

## 2019-12-18 NOTE — Patient Instructions (Signed)
[name] , Thank you for taking time to come for your Medicare Wellness Visit. I appreciate your ongoing commitment to your health goals. Please review the following plan we discussed and let me know if I can assist you in the future.   Screening recommendations/referrals: Colonoscopy: No longer required Mammogram: Up to date, next due 10/17/2020 Bone Density: Up to date, next due 09/22/2020 Recommended yearly ophthalmology/optometry visit for glaucoma screening and checkup Recommended yearly dental visit for hygiene and checkup  Vaccinations: Influenza vaccine: Currently due, you may receive this at our office or at your local pharmacy  Pneumococcal vaccine: Completed series Tdap vaccine: Up to date, next due 01/30/2027 Shingles vaccine: Completed series     Advanced directives: Copies on file   Conditions/risks identified: None   Next appointment: None    Preventive Care 65 Years and Older, Female Preventive care refers to lifestyle choices and visits with your health care provider that can promote health and wellness. What does preventive care include?  A yearly physical exam. This is also called an annual well check.  Dental exams once or twice a year.  Routine eye exams. Ask your health care provider how often you should have your eyes checked.  Personal lifestyle choices, including:  Daily care of your teeth and gums.  Regular physical activity.  Eating a healthy diet.  Avoiding tobacco and drug use.  Limiting alcohol use.  Practicing safe sex.  Taking low-dose aspirin every day.  Taking vitamin and mineral supplements as recommended by your health care provider. What happens during an annual well check? The services and screenings done by your health care provider during your annual well check will depend on your age, overall health, lifestyle risk factors, and family history of disease. Counseling  Your health care provider may ask you questions about  your:  Alcohol use.  Tobacco use.  Drug use.  Emotional well-being.  Home and relationship well-being.  Sexual activity.  Eating habits.  History of falls.  Memory and ability to understand (cognition).  Work and work Statistician.  Reproductive health. Screening  You may have the following tests or measurements:  Height, weight, and BMI.  Blood pressure.  Lipid and cholesterol levels. These may be checked every 5 years, or more frequently if you are over 32 years old.  Skin check.  Lung cancer screening. You may have this screening every year starting at age 23 if you have a 30-pack-year history of smoking and currently smoke or have quit within the past 15 years.  Fecal occult blood test (FOBT) of the stool. You may have this test every year starting at age 39.  Flexible sigmoidoscopy or colonoscopy. You may have a sigmoidoscopy every 5 years or a colonoscopy every 10 years starting at age 51.  Hepatitis C blood test.  Hepatitis B blood test.  Sexually transmitted disease (STD) testing.  Diabetes screening. This is done by checking your blood sugar (glucose) after you have not eaten for a while (fasting). You may have this done every 1-3 years.  Bone density scan. This is done to screen for osteoporosis. You may have this done starting at age 5.  Mammogram. This may be done every 1-2 years. Talk to your health care provider about how often you should have regular mammograms. Talk with your health care provider about your test results, treatment options, and if necessary, the need for more tests. Vaccines  Your health care provider may recommend certain vaccines, such as:  Influenza vaccine. This  is recommended every year.  Tetanus, diphtheria, and acellular pertussis (Tdap, Td) vaccine. You may need a Td booster every 10 years.  Zoster vaccine. You may need this after age 79.  Pneumococcal 13-valent conjugate (PCV13) vaccine. One dose is recommended  after age 27.  Pneumococcal polysaccharide (PPSV23) vaccine. One dose is recommended after age 67. Talk to your health care provider about which screenings and vaccines you need and how often you need them. This information is not intended to replace advice given to you by your health care provider. Make sure you discuss any questions you have with your health care provider. Document Released: 04/19/2015 Document Revised: 12/11/2015 Document Reviewed: 01/22/2015 Elsevier Interactive Patient Education  2017 [place] Prevention in the Home Falls can cause injuries. They can happen to people of all ages. There are many things you can do to make your home safe and to help prevent falls. What can I do on the outside of my home?  Regularly fix the edges of walkways and driveways and fix any cracks.  Remove anything that might make you trip as you walk through a door, such as a raised step or threshold.  Trim any bushes or trees on the path to your home.  Use bright outdoor lighting.  Clear any walking paths of anything that might make someone trip, such as rocks or tools.  Regularly check to see if handrails are loose or broken. Make sure that both sides of any steps have handrails.  Any raised decks and porches should have guardrails on the edges.  Have any leaves, snow, or ice cleared regularly.  Use sand or salt on walking paths during winter.  Clean up any spills in your garage right away. This includes oil or grease spills. What can I do in the bathroom?  Use night lights.  Install grab bars by the toilet and in the tub and shower. Do not use towel bars as grab bars.  Use non-skid mats or decals in the tub or shower.  If you need to sit down in the shower, use a plastic, non-slip stool.  Keep the floor dry. Clean up any water that spills on the floor as soon as it happens.  Remove soap buildup in the tub or shower regularly.  Attach bath mats securely with  double-sided non-slip rug tape.  Do not have throw rugs and other things on the floor that can make you trip. What can I do in the bedroom?  Use night lights.  Make sure that you have a light by your bed that is easy to reach.  Do not use any sheets or blankets that are too big for your bed. They should not hang down onto the floor.  Have a firm chair that has side arms. You can use this for support while you get dressed.  Do not have throw rugs and other things on the floor that can make you trip. What can I do in the kitchen?  Clean up any spills right away.  Avoid walking on wet floors.  Keep items that you use a lot in easy-to-reach places.  If you need to reach something above you, use a strong step stool that has a grab bar.  Keep electrical cords out of the way.  Do not use floor polish or wax that makes floors slippery. If you must use wax, use non-skid floor wax.  Do not have throw rugs and other things on the floor that can make you  trip. What can I do with my stairs?  Do not leave any items on the stairs.  Make sure that there are handrails on both sides of the stairs and use them. Fix handrails that are broken or loose. Make sure that handrails are as long as the stairways.  Check any carpeting to make sure that it is firmly attached to the stairs. Fix any carpet that is loose or worn.  Avoid having throw rugs at the top or bottom of the stairs. If you do have throw rugs, attach them to the floor with carpet tape.  Make sure that you have a light switch at the top of the stairs and the bottom of the stairs. If you do not have them, ask someone to add them for you. What else can I do to help prevent falls?  Wear shoes that:  Do not have high heels.  Have rubber bottoms.  Are comfortable and fit you well.  Are closed at the toe. Do not wear sandals.  If you use a stepladder:  Make sure that it is fully opened. Do not climb a closed stepladder.  Make  sure that both sides of the stepladder are locked into place.  Ask someone to hold it for you, if possible.  Clearly mark and make sure that you can see:  Any grab bars or handrails.  First and last steps.  Where the edge of each step is.  Use tools that help you move around (mobility aids) if they are needed. These include:  Canes.  Walkers.  Scooters.  Crutches.  Turn on the lights when you go into a dark area. Replace any light bulbs as soon as they burn out.  Set up your furniture so you have a clear path. Avoid moving your furniture around.  If any of your floors are uneven, fix them.  If there are any pets around you, be aware of where they are.  Review your medicines with your doctor. Some medicines can make you feel dizzy. This can increase your chance of falling. Ask your doctor what other things that you can do to help prevent falls. This information is not intended to replace advice given to you by your health care provider. Make sure you discuss any questions you have with your health care provider. Document Released: 01/17/2009 Document Revised: 08/29/2015 Document Reviewed: 04/27/2014 Elsevier Interactive Patient Education  2017 [employer].

## 2019-12-19 DIAGNOSIS — Z23 Encounter for immunization: Secondary | ICD-10-CM | POA: Diagnosis not present

## 2019-12-27 ENCOUNTER — Other Ambulatory Visit: Payer: Self-pay

## 2019-12-27 ENCOUNTER — Ambulatory Visit (INDEPENDENT_AMBULATORY_CARE_PROVIDER_SITE_OTHER)
Admission: RE | Admit: 2019-12-27 | Discharge: 2019-12-27 | Disposition: A | Discharge status: Home / Self Care | Payer: Medicare Other | Source: Ambulatory Visit | Attending: Family Medicine | Admitting: Family Medicine

## 2019-12-27 ENCOUNTER — Ambulatory Visit (INDEPENDENT_AMBULATORY_CARE_PROVIDER_SITE_OTHER): Payer: Medicare Other | Admitting: Family Medicine

## 2019-12-27 VITALS — BP 110/68 | HR 67 | Temp 98.2°F | Wt 126.2 lb

## 2019-12-27 DIAGNOSIS — M545 Low back pain: Secondary | ICD-10-CM | POA: Diagnosis not present

## 2019-12-27 DIAGNOSIS — M5418 Radiculopathy, sacral and sacrococcygeal region: Secondary | ICD-10-CM

## 2019-12-27 NOTE — Patient Instructions (Signed)
Go for X-ray at [facility] (basement).

## 2019-12-27 NOTE — Progress Notes (Signed)
Established Patient Office Visit  Subjective:  Patient ID: [name], female    DOB: [birth year]  Age: 79 y.o. MRN: [id number]  CC: No chief complaint on file.   HPI [name] presents for pain in her sacral region.  She first noticed this couple weeks ago.  Her main concern is that she had been on bone building therapy for osteoporosis and few years ago developed a stress fracture in her left femur which ended up in a full-fledged fracture.  Because of that episode she is concerned when she has musculoskeletal pain in her lower extremities.  She does have occasional radiation toward the right buttock.  Denies any lower extremity numbness or weakness.  Pain seems to be worse with sitting.  Denies any recent fall or injury.  Appetite and weight are stable.  No prior history of cancer.  No hip pain.  Past Medical History:  Diagnosis Date  . Broken femur ([place])   . Cancer ([place]) 2001   skin, nose  . Cataract   . Diabetes mellitus    Borderline/no meds  . Diverticulosis of colon   . DNR (do not resuscitate) 2009  . GERD (gastroesophageal reflux disease)   . Heart murmur   . Hyperlipidemia   . Hypertension   . Kidney stones    1 time  . Migraines    during menopause  . Osteoporosis 09/2018   T score -2.7 stable from prior DEXA.  [name] Pneumothorax on left     Past Surgical History:  Procedure Laterality Date  . ABDOMINAL HYSTERECTOMY  1994   fibroids/ complete  . BUNIONECTOMY  2004   left  . CARPAL TUNNEL RELEASE  2000   right   . CATARACT EXTRACTION     Bil  . COLONOSCOPY  10/24/2013  . FEMUR FRACTURE SURGERY    . LUNG SURGERY  2008   left lung VATS for biopsy and removal of blebs with pleurodesis  . OOPHORECTOMY     BSO  . POLYPECTOMY    . TONSILLECTOMY     as child    Family History  Problem Relation Age of Onset  . Cancer Mother        bone  . Diabetes Sister   . Hyperlipidemia Sister   . Ovarian cancer Sister   . Pulmonary embolism Father   . Cancer  Father        Facial  . Cancer Sister        melanoma  . Emphysema Maternal Aunt   . Breast cancer Neg Hx   . Colon cancer Neg Hx   . Colon polyps Neg Hx   . Esophageal cancer Neg Hx   . Rectal cancer Neg Hx   . Stomach cancer Neg Hx     Social History   Socioeconomic History  . Marital status: Divorced    Spouse name: Not on file  . Number of children: Not on file  . Years of education: Not on file  . Highest education level: Not on file  Occupational History  . Not on file  Tobacco Use  . Smoking status: Never Smoker  . Smokeless tobacco: Never Used  Vaping Use  . Vaping Use: Never used  Substance and Sexual Activity  . Alcohol use: No    Alcohol/week: 0.0 standard drinks  . Drug use: No  . Sexual activity: Not Currently    Comment: 1st intercourse 79 yo-Fewer than 5 partners  Other Topics Concern  . Not  on file  Social History Narrative  . Not on file   Social Determinants of Health   Financial Resource Strain: Low Risk   . Difficulty of Paying Living Expenses: Not hard at all  Food Insecurity: No Food Insecurity  . Worried About Charity fundraiser in the Last Year: Never true  . Ran Out of Food in the Last Year: Never true  Transportation Needs: No Transportation Needs  . Lack of Transportation (Medical): No  . Lack of Transportation (Non-Medical): No  Physical Activity: Inactive  . Days of Exercise per Week: 0 days  . Minutes of Exercise per Session: 0 min  Stress: No Stress Concern Present  . Feeling of Stress : Not at all  Social Connections: Moderately Isolated  . Frequency of Communication with Friends and Family: More than three times a week  . Frequency of Social Gatherings with Friends and Family: Once a week  . Attends Religious Services: More than 4 times per year  . Active Member of Clubs or Organizations: No  . Attends Archivist Meetings: Never  . Marital Status: Divorced  Human resources officer Violence: Not At Risk  . Fear of  Current or Ex-Partner: No  . Emotionally Abused: No  . Physically Abused: No  . Sexually Abused: No    Outpatient Medications Prior to Visit  Medication Sig Dispense Refill  . APPLE CIDER VINEGAR PO Take by mouth.    [name] aspirin EC 81 MG tablet Take 81 mg by mouth daily.    . Calcium Citrate-Vitamin D (CALCIUM CITRATE + D3 PO) 800mg  calcium 1000 IU Vit. D. Take by mouth daily.    . Chromium-Cinnamon (CINNAMON PLUS CHROMIUM PO) 2000 mg cinnamon 200 mcg Chromium Take by mouth daily.    . cycloSPORINE (RESTASIS) 0.05 % ophthalmic emulsion Place 1 drop into both eyes 2 (two) times daily.    [name] estradiol (CLIMARA - DOSED IN MG/24 HR) 0.075 mg/24hr patch Place 1 patch (0.075 mg total) onto the skin once a week. 4 patch 12  . glucose blood test strip Use as instructed 100 each 11  . lisinopril (ZESTRIL) 10 MG tablet Take 1 tablet (10 mg total) by mouth daily. Per Dr Elease Hashimoto 90 tablet 3  . MAGNESIUM PO Take 250 mg by mouth daily.    . Multiple Vitamin (MULTIVITAMIN PO) Take 1 tablet by mouth daily.     [name] OVER THE COUNTER MEDICATION Omega - 3 1040 mg take one tab daily    . simvastatin (ZOCOR) 20 MG tablet Take 1 tablet (20 mg total) by mouth daily. 90 tablet 3  . Ultilet Classic Lancets MISC Testing BS once daily 100 each 11   Facility-Administered Medications Prior to Visit  Medication Dose Route Frequency Provider Last Rate Last Admin  . 0.9 %  sodium chloride infusion  500 mL Intravenous Once Irene Shipper, MD        No Known Allergies  ROS Review of Systems  Constitutional: Negative for appetite change, chills, fever and unexpected weight change.  Respiratory: Negative for cough and shortness of breath.   Cardiovascular: Negative for chest pain.  Musculoskeletal: Positive for back pain.  Neurological: Negative for weakness and numbness.      Objective:    Physical Exam Constitutional:      Appearance: Normal appearance.  Cardiovascular:     Rate and Rhythm: Normal rate and  regular rhythm.  Pulmonary:     Effort: Pulmonary effort is normal.     Breath  sounds: Normal breath sounds.  Musculoskeletal:     Comments: Straight leg raises are negative.  She has some mild tenderness over her upper to mid sacral region.  No lumbar tenderness. Full range of motion right hip  Neurological:     Mental Status: She is alert.     Comments: Full strength lower extremities.  2+ reflexes knee and ankle bilaterally.  Normal sensory function throughout to touch     BP 110/68 (BP Location: Left Arm, Patient Position: Sitting, Cuff Size: Normal)   Pulse 67   Temp 98.2 F (36.8 C) (Oral)   Wt 126 lb 3.2 oz (57.2 kg)   SpO2 96%   BMI 24.65 kg/m  Wt Readings from Last 3 Encounters:  12/27/19 126 lb 3.2 oz (57.2 kg)  10/12/19 125 lb (56.7 kg)  09/28/19 125 lb (56.7 kg)     Health Maintenance Due  Topic Date Due  . Hepatitis C Screening  Never done  . OPHTHALMOLOGY EXAM  05/26/2019    There are no preventive care reminders to display for this patient.  Lab Results  Component Value Date   TSH 1.55 12/01/2018   Lab Results  Component Value Date   WBC 7.2 08/14/2019   HGB 13.6 08/14/2019   HCT 40.0 08/14/2019   MCV 97.3 08/14/2019   PLT 273.0 08/14/2019   Lab Results  Component Value Date   NA 141 04/28/2019   K 4.7 04/28/2019   CO2 29 04/28/2019   GLUCOSE 138 (H) 04/28/2019   BUN 19 04/28/2019   CREATININE 0.73 04/28/2019   BILITOT 0.7 04/28/2019   ALKPHOS 44 04/28/2019   AST 17 04/28/2019   ALT 17 04/28/2019   PROT 6.6 04/28/2019   ALBUMIN 4.6 04/28/2019   CALCIUM 9.8 04/28/2019   GFR 77.00 04/28/2019   Lab Results  Component Value Date   CHOL 151 04/28/2019   Lab Results  Component Value Date   HDL 59.20 04/28/2019   Lab Results  Component Value Date   LDLCALC 60 04/28/2019   Lab Results  Component Value Date   TRIG 163.0 (H) 04/28/2019   Lab Results  Component Value Date   CHOLHDL 3 04/28/2019   Lab Results  Component Value  Date   HGBA1C 6.1 (A) 07/28/2019      Assessment & Plan:   Problem List Items Addressed This Visit    None    Visit Diagnoses    Radicular pain of sacrum    -  Primary   Relevant Orders   DG Lumbar Spine Complete    Patient presents with a couple week history of sacral pain.  This does not seem to be over the sacroiliac joint region.  Patient is very concerned because of her prior history of fracture possibly related to bisphosphonate therapy.  We explained this would be very unlikely involving her sacrum. No red flags such as weight loss, fever, night sweats, hx of cancer.    -Start with lumbosacral spine films -If normal will observe for now.  If pain persists be in touch  No orders of the defined types were placed in this encounter.   Follow-up: No follow-ups on file.    [name], MD

## 2020-01-10 DIAGNOSIS — Z23 Encounter for immunization: Secondary | ICD-10-CM | POA: Diagnosis not present

## 2020-01-19 ENCOUNTER — Telehealth: Payer: Self-pay

## 2020-01-19 DIAGNOSIS — I10 Essential (primary) hypertension: Secondary | ICD-10-CM

## 2020-01-19 DIAGNOSIS — J439 Emphysema, unspecified: Secondary | ICD-10-CM

## 2020-01-19 NOTE — Telephone Encounter (Signed)
-----   Message from Viona Gilmore, [facility] sent at 01/19/2020  3:17 PM EDT ----- Regarding: CCM referral Hi again,  Can you please place a CCM referral for Ms. [name]? She is on my schedule next week.  Thanks, Maddie

## 2020-01-22 ENCOUNTER — Telehealth: Payer: Self-pay | Admitting: Pharmacist

## 2020-01-22 NOTE — Chronic Care Management (AMB) (Signed)
    Chronic Care Management Pharmacy Assistant   Name: [name]  MRN: [id number] DOB: November 19, 1940  Reason for Encounter: Medication Review/Initial Questions for Pharmacist visit on 01/24/2020   Patient Questions: 1. Have you seen any other providers since your last visit? No 2. Any changes in your medications or health? No 3. Any side effects from any medications? No  4. Do you have any symptoms or problems not managed by your medications? No 5. Any concerns about your health right now?  No 6. Has your provider asked that you check blood pressure, blood sugar, or follow a special diet at home?  Yes, she checks her blood sugar daily before breakfast. 7. Do you get any type of exercise regularly?  No 8. Can you think of a goal you would like to reach for your health? She would like to keep her A1C  at a good number. 9. Do you have any problems getting your medications? No 10. Is there anything that you would like to discuss during the appointment? No  PCP : Eulas Post, MD  Allergies:  No Known Allergies  Medications: Outpatient Encounter Medications as of 01/22/2020  Medication Sig Note  . APPLE CIDER VINEGAR PO Take by mouth.   [name] aspirin EC 81 MG tablet Take 81 mg by mouth daily.   . Calcium Citrate-Vitamin D (CALCIUM CITRATE + D3 PO) 800mg  calcium 1000 IU Vit. D. Take by mouth daily. 12/18/2019: 1200 MG calcium  2000 IU vitamin D   . Chromium-Cinnamon (CINNAMON PLUS CHROMIUM PO) 2000 mg cinnamon 200 mcg Chromium Take by mouth daily. 12/18/2019: 1000 mg of Cinnamon 100 mcg of chromium  . cycloSPORINE (RESTASIS) 0.05 % ophthalmic emulsion Place 1 drop into both eyes 2 (two) times daily.   [name] estradiol (CLIMARA - DOSED IN MG/24 HR) 0.075 mg/24hr patch Place 1 patch (0.075 mg total) onto the skin once a week.   [name] glucose blood test strip Use as instructed   . lisinopril (ZESTRIL) 10 MG tablet Take 1 tablet (10 mg total) by mouth daily. Per Dr Elease Hashimoto   . MAGNESIUM PO Take  250 mg by mouth daily.   . Multiple Vitamin (MULTIVITAMIN PO) Take 1 tablet by mouth daily.    [name] OVER THE COUNTER MEDICATION Omega - 3 1040 mg take one tab daily   . simvastatin (ZOCOR) 20 MG tablet Take 1 tablet (20 mg total) by mouth daily.   [name] Ultilet Classic Lancets MISC Testing BS once daily    Facility-Administered Encounter Medications as of 01/22/2020  Medication  . 0.9 %  sodium chloride infusion    Current Diagnosis: Patient Active Problem List   Diagnosis Date Noted  . Type 2 diabetes mellitus, controlled ([place]) 06/19/2010  . Essential hypertension 06/19/2010  . Osteoporosis 04/28/2010  . PHARYNGITIS 08/01/2008  . DYSPNEA 07/30/2008  . Hyperlipidemia 05/31/2008  . MIGRAINE HEADACHE 09/19/2007  . GERD 09/19/2007  . DIVERTICULOSIS, COLON 09/19/2007  . 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 SPONTANEOUS PNEUMOTHORAX 05/16/2007    Goals Addressed   None     Follow-Up:  Pharmacist Review   Maia Breslow, [place] Assistant [phone]

## 2020-01-24 ENCOUNTER — Ambulatory Visit: Payer: Medicare Other

## 2020-01-24 ENCOUNTER — Other Ambulatory Visit: Payer: Self-pay | Admitting: Family Medicine

## 2020-01-24 DIAGNOSIS — I10 Essential (primary) hypertension: Secondary | ICD-10-CM

## 2020-01-24 DIAGNOSIS — E119 Type 2 diabetes mellitus without complications: Secondary | ICD-10-CM

## 2020-01-24 MED ORDER — GLUCOSE BLOOD VI STRP
ORAL_STRIP | 3 refills | Status: AC
Start: 2020-01-24 — End: ?

## 2020-01-24 MED ORDER — ULTILET CLASSIC LANCETS MISC: 3 refills | Status: AC

## 2020-01-24 NOTE — Chronic Care Management (AMB) (Signed)
Chronic Care Management Pharmacy  Name: [name]  MRN: [id number] DOB: Jul 20, 1940  Initial Planning Appointment: completed 01/22/20  Initial Questions: 1. Have you seen any other providers since your last visit? n/a 2. Any changes in your medicines or health? No   Chief Complaint/ HPI  [name],  79 y.o. , female presents for their Initial CCM visit with the clinical pharmacist via telephone due to COVID-19 Pandemic.  PCP : Eulas Post, MD  Their chronic conditions include: HTN, HLD, DM, osteoporosis, GERD, menopause symptoms  Office Visits: -12/27/19 [name], MD: Patient presented with radicular pain of sacrum. Patient has been on bone building therapy and developed a stress fracture in the past and is having similar pain. Pain is worse when sitting. Completed lumbar spine films and only mild degenerative changes of lumbar spine. No other acute bone changes.  -12/18/19 Ofilia Neas, LPN: Patient presented for Medicare annual wellness visit. No changes made.  -08/14/19 [name], MD: Patient presented with rectal bleeding and abdominal pain. CBC was normal.  -07/28/19 [name], MD: Patient presented for follow up for chronic disease. A1c decreased to 6.1%. HTN, lipids and A1c controlled. Refilled Climara patch as benefits of estrogen therapy outweighed the risks as this time. Follow up in 6 months.  Consult Visit: -09/28/19 Sundra Aland, RN (endoscopy center): Patient presented prior for pre-colonoscopy appointment due to personal history of colonic polyps.   -08/17/19 Macarthur Critchley (optometry): Patient presented for eye exam. Unable to access notes.  Medications: Outpatient Encounter Medications as of 01/24/2020  Medication Sig Note   APPLE CIDER VINEGAR PO Take 900 mg by mouth daily.     Calcium Citrate-Vitamin D (CALCIUM CITRATE + D3 PO) 1271m calcium 2000 IU Vit. D. Take by mouth daily. 12/18/2019: 1200 MG calcium  2000 IU vitamin D     Chromium-Cinnamon (CINNAMON PLUS CHROMIUM PO) 1000 mg cinnamon 100 mcg Chromium Take by mouth daily. 12/18/2019: 1000 mg of Cinnamon 100 mcg of chromium   cycloSPORINE (RESTASIS) 0.05 % ophthalmic emulsion Place 1 drop into both eyes 2 (two) times daily.    estradiol (CLIMARA - DOSED IN MG/24 HR) 0.075 mg/24hr patch Place 1 patch (0.075 mg total) onto the skin once a week.    lisinopril (ZESTRIL) 10 MG tablet Take 1 tablet (10 mg total) by mouth daily. Per Dr BElease Hashimoto   MAGNESIUM PO Take 250 mg by mouth daily.    Multiple Vitamin (MULTIVITAMIN PO) Take 1 tablet by mouth daily.     OVER THE COUNTER MEDICATION Omega - 3 1040 mg take one tab daily    simvastatin (ZOCOR) 20 MG tablet Take 1 tablet (20 mg total) by mouth daily.    aspirin EC 81 MG tablet Take 81 mg by mouth daily. (Patient not taking: Reported on 01/24/2020)    glucose blood test strip Use as instructed    Ultilet Classic Lancets MISC Testing BS once daily    Facility-Administered Encounter Medications as of 01/24/2020  Medication   0.9 %  sodium chloride infusion   Patient is retired and lives alone. Her days consist of doing normal chores and she does her own housework and goes out for different things. She still drives and even waxes her own car. [place] sister in [place] of family [place] in [place]  She denies eating out often as it is expensive and prepares food for herself. She does not like cooking much and eat lots of  salads, vegetables, fruits, nuts, grains, and dairy. She is not big meat eater but has it sometimes. She does not eat many canned foods but does eat frozen vegetables sometimes.  Patient does not get much exercise other than rehab exercises in her apartment.  She used to walk in the park but had not done so as she is afraid to as she has had a fall previously and broke her femur.  Patient sleeps about 6-7 hours per night and does leep and does report some trouble falling asleep  2-3 nights per week as she is unable to turn off her mind. She does not intentionally take naps during the day but will occassionally accidentally fall asleep.  She enjoys reading, making puzzles and soduku at home.   Patient denies any current issues with her medicines.  Current Diagnosis/Assessment:  Goals Addressed            This Visit's Progress    Pharmacy Care Plan       CARE PLAN ENTRY (see longitudinal plan of care for additional care plan information)  Current Barriers:   Chronic Disease Management support, education, and care coordination needs related to Hypertension, Hyperlipidemia, Diabetes, Osteoporosis, and chronic dry eye   Hypertension BP Readings from Last 3 Encounters:  12/27/19 110/68  10/12/19 (!) 121/53  08/14/19 106/60    Pharmacist Clinical Goal(s): o Over the next 120 days, patient will work with PharmD and providers to maintain BP goal <140/90  Current regimen:  o Lisinopril 10 mg 1 tablet daily  Interventions: o We discussed the importance of checking your blood pressure at home regularly and sent in a prescription for a new blood pressure cuff o We discussed the impact that exercise and limiting salt intake can have on blood pressure  Patient self care activities - Over the next 120 days, patient will: o Check blood pressure weekly, document, and provide at future appointments o Ensure daily salt intake < 2300 mg/day  Hyperlipidemia Lab Results  Component Value Date/Time   LDLCALC 60 04/28/2019 11:19 AM   LDLDIRECT 74.0 04/08/2012 01:46 PM    Pharmacist Clinical Goal(s): o Over the next 120 days, patient will work with PharmD and providers to maintain LDL goal < 100  Current regimen:  o Simvastatin 20 mg 1 tablet daily at bedtime o Fish oil 1000 mg 1 tablet daily  Interventions: o We discussed foods that can impact triglycerides such as sweets, pasta, bread, and alcohol  Patient self care activities - Over the next 120 days,  patient will: o Continue current medications  Diabetes Lab Results  Component Value Date/Time   HGBA1C 6.1 (A) 07/28/2019 01:14 PM   HGBA1C 6.6 (H) 04/28/2019 11:19 AM   HGBA1C 5.8 (A) 04/29/2018 10:33 AM   HGBA1C 6.6 (H) 04/30/2017 10:51 AM    Pharmacist Clinical Goal(s): o Over the next 120 days, patient will work with PharmD and providers to maintain A1c goal <7%  Current regimen:  o No medications  Interventions: o We discussed treating blood sugars < 70 with juice or candy and then followed by a large snack or meal containing protein  Patient self care activities - Over the next 120 days, patient will: o Check blood sugar once daily, document, and provide at future appointments o Contact provider with any episodes of hypoglycemia  Osteoporosis  Pharmacist Clinical Goal(s) o Over the next 120 days, patient will work with PharmD and providers to maintain bone health  Current regimen:  o Calcium citrate &  vit D3 800 mg - 1000 units 1 tablet daily  Interventions: o We discussed the importance of weight bearing exercise to strengthen bones  Patient self care activities - Over the next 120 days, patient will: o Continue current supplementation  Chronic dry eye  Pharmacist Clinical Goal(s) o Over the next 120 days, patient will work with PharmD and providers to treat symptoms of dry eye  Current regimen:  o Restasis 0.05% ophthalmic emulsion, 1 drop into both eyes twice daily  Interventions: o We discussed patient assistance application for Restasis and will provide application for patient  Patient self care activities - Over the next 120 days, patient will: o Continue current medications o Fill out patient assistance application for Restasis and return to eye doctors office  Medication management  Pharmacist Clinical Goal(s): o Over the next 120 days, patient will work with PharmD and providers to maintain optimal medication adherence  Current [facility]  Interventions o Comprehensive medication review performed. o Continue current medication management strategy  Patient self care activities - Over the next 120 days, patient will: o Take medications as prescribed o Report any questions or concerns to PharmD and/or provider(s)  Initial goal documentation       SDOH Interventions     Most Recent Value  SDOH Interventions  Financial Strain Interventions Other (Comment)  [working on patient assistance application for Restasis]  Transportation Interventions Intervention Not Indicated      Hypertension   BP goal is:  <140/90  Office blood pressures are  BP Readings from Last 3 Encounters:  12/27/19 110/68  10/12/19 (!) 121/53  08/14/19 106/60   Patient checks BP at home never - patient's BP cuff is too big Patient home BP readings are ranging: n/a  Patient is currently controlled on the following medications:   Lisinopril 10 mg 1 tablet daily - in AM  We discussed diet and exercise extensively and checking blood pressure at home  -Diet: patient does use salt but limits her intake; she does not eat out much and eats a lot of salads, which she does not add salt to -Exercise: we discussed the importance of exercise and how it can impact blood pressure  Plan Dizziness/lightheadedness. Will request for arm BP cuff prescription to be sent into pharmacy. Continue current medications     Hyperlipidemia   LDL goal < 100  Lipid Panel     Component Value Date/Time   CHOL 151 04/28/2019 1119   TRIG 163.0 (H) 04/28/2019 1119   HDL 59.20 04/28/2019 1119   LDLCALC 60 04/28/2019 1119   LDLDIRECT 74.0 04/08/2012 1346    Hepatic Function Latest Ref Rng & Units 04/28/2019 04/29/2018 04/30/2017  Total Protein 6.0 - 8.3 g/dL 6.6 6.6 6.4  Albumin 3.5 - 5.2 g/dL 4.6 4.6 4.4  AST 0 - 37 U/L _0 ALT 0 - 35 U/L _1 Alk Phosphatase 39 - 117 U/L 44 42 41  Total Bilirubin 0.2 - 1.2 mg/dL 0.7 0.8 0.9  Bilirubin, Direct  0.0 - 0.3 mg/dL 0.1 0.1 0.2     The 10-year ASCVD risk score Mikey Bussing DC Jr., et al., 2013) is: 42.2%   Values used to calculate the score:     Age: 68 years     Sex: Female     Is Non-Hispanic African American: No     Diabetic: Yes     Tobacco smoker: No     Systolic Blood Pressure: 791 mmHg  Is BP treated: Yes     HDL Cholesterol: 59.2 mg/dL     Total Cholesterol: 151 mg/dL   Patient has failed these meds in past: none Patient is currently controlled on the following medications:   Simvastatin 20 mg 1 tablet daily - in PM  Fish oil 1000 mg 1 tablet daily  We discussed:  diet and exercise extensively -Diet: we discussed foods that can impact triglycerides such as sweets, pasta, bread, alcohol Patient drinks lots of water and does have some juice every once in a while -Exercise  Plan  Continue current medications    Diabetes   A1c goal <7%  Recent Relevant Labs: Lab Results  Component Value Date/Time   HGBA1C 6.1 (A) 07/28/2019 01:14 PM   HGBA1C 6.6 (H) 04/28/2019 11:19 AM   HGBA1C 5.8 (A) 04/29/2018 10:33 AM   HGBA1C 6.6 (H) 04/30/2017 10:51 AM   GFR 77.00 04/28/2019 11:19 AM   GFR 81.03 04/29/2018 11:11 AM    Last diabetic Eye exam:  Lab Results  Component Value Date/Time   HMDIABEYEEXA No Retinopathy 05/25/2018 12:00 AM    Last diabetic Foot exam: No results found for: HMDIABFOOTEX   Checking BG: Daily  Recent FBG Readings: 137, 120s-130s, less than 150  Patient has failed these meds in past: none Patient is currently controlled on the following medications:  No medications  We discussed: how to recognize and treat signs of hypoglycemia  -Patient denies any recent symptoms of hypoglycemia or BGs < 70 -Treatment of hypoglycemia: drinks orange juice or 3 candy chocolate kiss; we discussed the importance of having protein/meal after BGs > 70 -Eye exam: recommended for patient to have eye doctor send over results from exam in May 2021  Plan Will  request for PCP to send updated Rxs for test strips and lancets as patient is hoping to check BGs more than once daily. Continue control with diet and exercise  CVD prevention   Patient has failed these meds in past: none Patient is currently on the following medications:   Aspirin 81 mg 1 tablet daily - patient stopped a week ago  We discussed:  Benefits vs risk of taking aspirin therapy   Plan Will discuss with PCP about the need for aspirin therapy. Continue current medications  Osteoporosis   Last DEXA Scan: 09/2018  Results of report not found - indicates T-score of < -2.5 in unknown location  VITD  Date Value Ref Range Status  12/01/2018 45.51 30.00 - 100.00 ng/mL Final     Patient is a candidate for pharmacologic treatment due to T-Score < -2.5 in femoral neck - patient has had a fracture with a previous medication and does not wish to try any others  Patient has failed these meds in past: Fosamax (atypical femur fracture after 3 years of treatment) Patient is currently controlled on the following medications:   Calcium citrate & vit D3 800 mg - 1000 units 1 tablet daily  We discussed:  Recommend [phone] units of vitamin D daily. Recommend 1200 mg of calcium daily from dietary and supplemental sources. Recommend weight-bearing and muscle strengthening exercises for building and maintaining bone density.  Plan  Continue current medications   Chronic dry eye   Patient has failed these meds in past: none Patient is currently controlled on the following medications:   Restasis 0.05% ophthalmic emulsion 1 drop in both eyes twice daily  We discussed:  Cost of Restasis and patient is currently choosing her plan based on this  Plan Will mail patient assistance application for Restasis for optometrist to fill out.  Continue current medications   GERD   Patient is currently controlled on the following medications:   No medications   We discussed:  Patient  uses Tums as needed but denies any current use  Plan  Continue current management.  Menopause   Patient is currently controlled on the following medications:   Climara patch 0.075 mg/24 hour apply 1 patch once a week  We discussed:  Risk for clotting while taking estrogen therapy  Plan  Continue current medications   Vitamins/supplements  Patient is currently on the following medications:   Apple cider vinegar 1 tablet by mouth  Cinnamon plus chromium 2000 mg-200 mcg 1 tablet daily  Magnesium 250 mg 1 tablet daily   Multivitamin 1 tablet daily   We discussed:  The safety and efficacy of these supplements as they relate to her diabetes  Plan  Continue current medications  Vaccines   Reviewed and discussed patient's vaccination history.    Immunization History  Administered Date(s) Administered   Fluad Quad(high Dose 65+) 12/15/2018, 12/19/2019   Influenza Split 01/01/2012   Influenza Whole 12/27/2007, 12/06/2010   Influenza, High Dose Seasonal PF 01/11/2013, 12/07/2014, 12/06/2015, 12/02/2016   Influenza,inj,Quad PF,6+ Mos 12/28/2013   Influenza-Unspecified 12/01/2016, 12/28/2016, 12/15/2018   PFIZER SARS-COV-2 Vaccination 04/21/2019, 05/11/2019   Pneumococcal Conjugate-13 04/06/2006, 10/11/2013   Pneumococcal Polysaccharide-23 04/28/2016   Tdap 01/29/2017   Zoster 03/28/2007   Zoster Recombinat (Shingrix) 10/26/2018, 10/26/2018, 12/30/2018, 12/30/2018   Patient reported receiving her COVID booster on October 6th. Will send message for CMA to add to chart.  Plan  Patient is up to date on all immunizations.   Medication Management   [facility] for [facility] medications Uses pill box? No - all lined up and uses AM and PM cups Pt endorses 100% compliance  We discussed: Current pharmacy is preferred with insurance plan and patient is satisfied with pharmacy services  Plan  Continue current medication management strategy   Follow up:  4 month phone visit  Jeni Salles, PharmD Clinical Pharmacist [facility] [place] [phone]

## 2020-01-24 NOTE — Patient Instructions (Addendum)
Hi [name]!  It was such a pleasure getting to speak with you over the phone today! As we discussed, please start checking your blood pressure a few times a month just to make sure your medicine is working and that you aren't having any low blood pressure at home. I have attached the patient assistance application for Restasis and highlighted the sections you need to fill out. Bring it back to your eye doctor who should be able to fill out the rest.   I also attached a handout that I hope will be helpful to give you other ideas for weight-bearing exercises beyond just walking.  Dr. Elease Hashimoto said it was ok to go ahead and stop taking the aspirin every day as we discussed.  Please call me if you have any questions or need anything before our next touch base!  Best, Maddie  Jeni Salles, PharmD Clinical Pharmacist [facility]   Visit Information  Goals Addressed            This Visit's Progress   . Pharmacy Care Plan       CARE PLAN ENTRY (see longitudinal plan of care for additional care plan information)  Current Barriers:  . Chronic Disease Management support, education, and care coordination needs related to Hypertension, Hyperlipidemia, Diabetes, Osteoporosis, and chronic dry eye   Hypertension BP Readings from Last 3 Encounters:  12/27/19 110/68  10/12/19 (!) 121/53  08/14/19 106/60   . Pharmacist Clinical Goal(s): o Over the next 120 days, patient will work with PharmD and providers to maintain BP goal <140/90 . Current regimen:  o Lisinopril 10 mg 1 tablet daily . Interventions: o We discussed the importance of checking your blood pressure at home regularly and sent in a prescription for a new blood pressure cuff o We discussed the impact that exercise and limiting salt intake can have on blood pressure . Patient self care activities - Over the next 120 days, patient will: o Check blood pressure weekly, document, and provide at  future appointments o Ensure daily salt intake < 2300 mg/day  Hyperlipidemia Lab Results  Component Value Date/Time   LDLCALC 60 04/28/2019 11:19 AM   LDLDIRECT 74.0 04/08/2012 01:46 PM   . Pharmacist Clinical Goal(s): o Over the next 120 days, patient will work with PharmD and providers to maintain LDL goal < 100 . Current regimen:  o Simvastatin 20 mg 1 tablet daily at bedtime o Fish oil 1000 mg 1 tablet daily . Interventions: o We discussed foods that can impact triglycerides such as sweets, pasta, bread, and alcohol . Patient self care activities - Over the next 120 days, patient will: o Continue current medications  Diabetes Lab Results  Component Value Date/Time   HGBA1C 6.1 (A) 07/28/2019 01:14 PM   HGBA1C 6.6 (H) 04/28/2019 11:19 AM   HGBA1C 5.8 (A) 04/29/2018 10:33 AM   HGBA1C 6.6 (H) 04/30/2017 10:51 AM   . Pharmacist Clinical Goal(s): o Over the next 120 days, patient will work with PharmD and providers to maintain A1c goal <7% . Current regimen:  o No medications . Interventions: o We discussed treating blood sugars < 70 with juice or candy and then followed by a large snack or meal containing protein . Patient self care activities - Over the next 120 days, patient will: o Check blood sugar once daily, document, and provide at future appointments o Contact provider with any episodes of hypoglycemia  Osteoporosis . Pharmacist Clinical Goal(s) o Over the next 120  days, patient will work with PharmD and providers to maintain bone health . Current regimen:  o Calcium citrate & vit D3 800 mg - 1000 units 1 tablet daily . Interventions: o We discussed the importance of weight bearing exercise to strengthen bones . Patient self care activities - Over the next 120 days, patient will: o Continue current supplementation  Chronic dry eye . Pharmacist Clinical Goal(s) o Over the next 120 days, patient will work with PharmD and providers to treat symptoms of dry  eye . Current regimen:  o Restasis 0.05% ophthalmic emulsion, 1 drop into both eyes twice daily . Interventions: o We discussed patient assistance application for Restasis and will provide application for patient . Patient self care activities - Over the next 120 days, patient will: o Continue current medications o Fill out patient assistance application for Restasis and return to eye doctors office  Medication management . Pharmacist Clinical Goal(s): o Over the next 120 days, patient will work with PharmD and providers to maintain optimal medication adherence . Current pharmacy: [facility] . Interventions o Comprehensive medication review performed. o Continue current medication management strategy . Patient self care activities - Over the next 120 days, patient will: o Take medications as prescribed o Report any questions or concerns to PharmD and/or provider(s)  Initial goal documentation        [name] was given information about Chronic Care Management services today including:  1. CCM service includes personalized support from designated clinical staff supervised by her physician, including individualized plan of care and coordination with other care providers 2. 24/7 contact phone numbers for assistance for urgent and routine care needs. 3. Standard insurance, coinsurance, copays and deductibles apply for chronic care management only during months in which we provide at least 20 minutes of these services. Most insurances cover these services at 100%, however patients may be responsible for any copay, coinsurance and/or deductible if applicable. This service may help you avoid the need for more expensive face-to-face services. 4. Only one practitioner may furnish and bill the service in a calendar month. 5. The patient may stop CCM services at any time (effective at the end of the month) by phone call to the office staff.  Patient agreed to services and verbal consent obtained.    The patient verbalized understanding of instructions provided today and agreed to receive a mailed copy of patient instruction and/or educational materials. Telephone follow up appointment with pharmacy team member scheduled for: 4 months  Bone Health Bones protect organs, store calcium, anchor muscles, and support the whole body. Keeping your bones strong is important, especially as you get older. You can take actions to help keep your bones strong and healthy. Why is keeping my bones healthy important?  Keeping your bones healthy is important because your body constantly replaces bone cells. Cells get old, and new cells take their place. As we age, we lose bone cells because the body may not be able to make enough new cells to replace the old cells. The amount of bone cells and bone tissue you have is referred to as bone mass. The higher your bone mass, the stronger your bones. The aging process leads to an overall loss of bone mass in the body, which can increase the likelihood of:  Joint pain and stiffness.  Broken bones.  A condition in which the bones become weak and brittle (osteoporosis). A large decline in bone mass occurs in older adults. In women, it occurs about the time  of menopause. What actions can I take to keep my bones healthy? Good health habits are important for maintaining healthy bones. This includes eating nutritious foods and exercising regularly. To have healthy bones, you need to get enough of the right minerals and vitamins. Most nutrition experts recommend getting these nutrients from the foods that you eat. In some cases, taking supplements may also be recommended. Doing certain types of exercise is also important for bone health. What are the nutritional recommendations for healthy bones?  Eating a well-balanced diet with plenty of calcium and vitamin D will help to protect your bones. Nutritional recommendations vary from person to person. Ask your health care  provider what is healthy for you. Here are some general guidelines. Get enough calcium Calcium is the most important (essential) mineral for bone health. Most people can get enough calcium from their diet, but supplements may be recommended for people who are at risk for osteoporosis. Good sources of calcium include:  Dairy products, such as low-fat or nonfat milk, cheese, and yogurt.  Dark green leafy vegetables, such as bok choy and broccoli.  Calcium-fortified foods, such as orange juice, cereal, bread, soy beverages, and tofu products.  Nuts, such as almonds. Follow these recommended amounts for daily calcium intake:  Children, age 58-3: 700 mg.  Children, age 41-8: 1,000 mg.  Children, age 24-13: 1,300 mg.  Teens, age 79-18: 1,300 mg.  Adults, age 73-50: 1,000 mg.  Adults, age 54-70: ? Men: 1,000 mg. ? Women: 1,200 mg.  Adults, age 7 or older: 1,200 mg.  Pregnant and breastfeeding females: ? Teens: 1,300 mg. ? Adults: 1,000 mg. Get enough vitamin D Vitamin D is the most essential vitamin for bone health. It helps the body absorb calcium. Sunlight stimulates the skin to make vitamin D, so be sure to get enough sunlight. If you live in a cold climate or you do not get outside often, your health care provider may recommend that you take vitamin D supplements. Good sources of vitamin D in your diet include:  Egg yolks.  Saltwater fish.  Milk and cereal fortified with vitamin D. Follow these recommended amounts for daily vitamin D intake:  Children and teens, age 58-18: 600 international units.  Adults, age 52 or younger: 400-800 international units.  Adults, age 52 or older: 800-1,000 international units. Get other important nutrients Other nutrients that are important for bone health include:  Phosphorus. This mineral is found in meat, poultry, dairy foods, nuts, and legumes. The recommended daily intake for adult men and adult women is 700 mg.  Magnesium. This  mineral is found in seeds, nuts, dark green vegetables, and legumes. The recommended daily intake for adult men is 400-420 mg. For adult women, it is 310-320 mg.  Vitamin K. This vitamin is found in green leafy vegetables. The recommended daily intake is 120 mg for adult men and 90 mg for adult women. What type of physical activity is best for building and maintaining healthy bones? Weight-bearing and strength-building activities are important for building and maintaining healthy bones. Weight-bearing activities cause muscles and bones to work against gravity. Strength-building activities increase the strength of the muscles that support bones. Weight-bearing and muscle-building activities include:  Walking and hiking.  Jogging and running.  Dancing.  Gym exercises.  Lifting weights.  Tennis and racquetball.  Climbing stairs.  Aerobics. Adults should get at least 30 minutes of moderate physical activity on most days. Children should get at least 60 minutes of moderate physical activity on  most days. Ask your health care provider what type of exercise is best for you. How can I find out if my bone mass is low? Bone mass can be measured with an X-ray test called a bone mineral density (BMD) test. This test is recommended for all women who are age 92 or older. It may also be recommended for:  Men who are age 69 or older.  People who are at risk for osteoporosis because of: ? Having bones that break easily. ? Having a long-term disease that weakens bones, such as kidney disease or rheumatoid arthritis. ? Having menopause earlier than normal. ? Taking medicine that weakens bones, such as steroids, thyroid hormones, or hormone treatment for breast cancer or prostate cancer. ? Smoking. ? Drinking three or more alcoholic drinks a day. If you find that you have a low bone mass, you may be able to prevent osteoporosis or further bone loss by changing your diet and lifestyle. Where can I find  more information? For more information, check out the following websites:  [place]: AviationTales.fr  [employer] of Health: www.bones.SouthExposed.es  International Osteoporosis Foundation: Administrator.iofbonehealth.org Summary  The aging process leads to an overall loss of bone mass in the body, which can increase the likelihood of broken bones and osteoporosis.  Eating a well-balanced diet with plenty of calcium and vitamin D will help to protect your bones.  Weight-bearing and strength-building activities are also important for building and maintaining strong bones.  Bone mass can be measured with an X-ray test called a bone mineral density (BMD) test. This information is not intended to replace advice given to you by your health care provider. Make sure you discuss any questions you have with your health care provider. Document Revised: 04/19/2017 Document Reviewed: 04/19/2017 Elsevier Patient Education  2020 [employer].

## 2020-01-30 ENCOUNTER — Ambulatory Visit: Payer: Medicare Other | Admitting: Family Medicine

## 2020-02-07 ENCOUNTER — Encounter: Payer: Self-pay | Admitting: Family Medicine

## 2020-02-07 ENCOUNTER — Ambulatory Visit (INDEPENDENT_AMBULATORY_CARE_PROVIDER_SITE_OTHER): Payer: Medicare Other | Admitting: Family Medicine

## 2020-02-07 ENCOUNTER — Other Ambulatory Visit: Payer: Self-pay

## 2020-02-07 VITALS — BP 122/76 | HR 66 | Temp 98.1°F | Ht 60.0 in | Wt 124.9 lb

## 2020-02-07 DIAGNOSIS — I10 Essential (primary) hypertension: Secondary | ICD-10-CM | POA: Diagnosis not present

## 2020-02-07 DIAGNOSIS — M810 Age-related osteoporosis without current pathological fracture: Secondary | ICD-10-CM | POA: Diagnosis not present

## 2020-02-07 DIAGNOSIS — E785 Hyperlipidemia, unspecified: Secondary | ICD-10-CM

## 2020-02-07 DIAGNOSIS — E119 Type 2 diabetes mellitus without complications: Secondary | ICD-10-CM

## 2020-02-07 LAB — POCT GLYCOSYLATED HEMOGLOBIN (HGB A1C)
HbA1c POC (<> result, manual entry): 6.1 % (ref 4.0–5.6)
HbA1c, POC (controlled diabetic range): 6.1 % (ref 0.0–7.0)
HbA1c, POC (prediabetic range): 6.1 % (ref 5.7–6.4)
Hemoglobin A1C: 6.1 % — AB (ref 4.0–5.6)

## 2020-02-07 NOTE — Progress Notes (Signed)
Established Patient Office Visit  Subjective:  Patient ID: SPRING SAN, female    DOB: 05-06-1940  Age: 79 y.o. MRN: [id number]  CC:  Chief Complaint  Patient presents with  . Follow-up    6 month follow up ,     HPI [name] presents for 62-month routine medical follow-up.  She has history of hypertension, migraine headaches, type 2 diabetes controlled without medication, osteoporosis, hyperlipidemia.  Her current medications include simvastatin 20 mg daily, lisinopril 10 mg daily, low-dose Climara patch once weekly.  She has been taking aspirin one daily.  She had recent visit with clinical pharmacist.  There was discussion about her aspirin use.  She does not have prior history of known coronary artery disease or cerebrovascular disease.  We concur with discontinuing aspirin at this time.  There was question raised regarding her estrogen.  This was started and has been maintained by her gynecologist.  They have had lengthy discussions regarding pros and cons of estrogen therapy in the past.  She is at high risk of blood clots on estrogen obviously but has history of osteoporosis and very high risk of complications from that.  Patient had elected to stay on estrogen after long discussion of pros and cons.  This again is still been managed by her gynecologist.  She has not had any recent falls.  She had complicated femur fracture after bisphosphonate therapy.  She has been followed by endocrinology regarding her osteoporosis.  She stays fairly active.  Blood sugars been very stable.  Last A1c 6.1%.  No recent polyuria or polydipsia.  Past Medical History:  Diagnosis Date  . Broken femur ([place])   . Cancer ([place]) 2001   skin, nose  . Cataract   . Diabetes mellitus    Borderline/no meds  . Diverticulosis of colon   . DNR (do not resuscitate) 2009  . GERD (gastroesophageal reflux disease)   . Heart murmur   . Hyperlipidemia   . Hypertension   . Kidney stones    1 time  . Migraines     during menopause  . Osteoporosis 09/2018   T score -2.7 stable from prior DEXA.  [name] Pneumothorax on left     Past Surgical History:  Procedure Laterality Date  . ABDOMINAL HYSTERECTOMY  1994   fibroids/ complete  . BUNIONECTOMY  2004   left  . CARPAL TUNNEL RELEASE  2000   right   . CATARACT EXTRACTION     Bil  . COLONOSCOPY  10/24/2013  . FEMUR FRACTURE SURGERY    . LUNG SURGERY  2008   left lung VATS for biopsy and removal of blebs with pleurodesis  . OOPHORECTOMY     BSO  . POLYPECTOMY    . TONSILLECTOMY     as child    Family History  Problem Relation Age of Onset  . Cancer Mother        bone  . Diabetes Sister   . Hyperlipidemia Sister   . Ovarian cancer Sister   . Pulmonary embolism Father   . Cancer Father        Facial  . Cancer Sister        melanoma  . Emphysema Maternal Aunt   . Breast cancer Neg Hx   . Colon cancer Neg Hx   . Colon polyps Neg Hx   . Esophageal cancer Neg Hx   . Rectal cancer Neg Hx   . Stomach cancer Neg Hx  Social History   Socioeconomic History  . Marital status: Divorced    Spouse name: Not on file  . Number of children: Not on file  . Years of education: Not on file  . Highest education level: Not on file  Occupational History  . Not on file  Tobacco Use  . Smoking status: Never Smoker  . Smokeless tobacco: Never Used  Vaping Use  . Vaping Use: Never used  Substance and Sexual Activity  . Alcohol use: No    Alcohol/week: 0.0 standard drinks  . Drug use: No  . Sexual activity: Not Currently    Comment: 1st intercourse 79 yo-Fewer than 5 partners  Other Topics Concern  . Not on file  Social History Narrative  . Not on file   Social Determinants of Health   Financial Resource Strain: Low Risk   . Difficulty of Paying Living Expenses: Not very hard  Food Insecurity: No Food Insecurity  . Worried About Charity fundraiser in the Last Year: Never true  . Ran Out of Food in the Last Year: Never true    Transportation Needs: No Transportation Needs  . Lack of Transportation (Medical): No  . Lack of Transportation (Non-Medical): No  Physical Activity: Inactive  . Days of Exercise per Week: 0 days  . Minutes of Exercise per Session: 0 min  Stress: No Stress Concern Present  . Feeling of Stress : Not at all  Social Connections: Moderately Isolated  . Frequency of Communication with Friends and Family: More than three times a week  . Frequency of Social Gatherings with Friends and Family: Once a week  . Attends Religious Services: More than 4 times per year  . Active Member of Clubs or Organizations: No  . Attends Archivist Meetings: Never  . Marital Status: Divorced  Human resources officer Violence: Not At Risk  . Fear of Current or Ex-Partner: No  . Emotionally Abused: No  . Physically Abused: No  . Sexually Abused: No    Outpatient Medications Prior to Visit  Medication Sig Dispense Refill  . APPLE CIDER VINEGAR PO Take 900 mg by mouth daily.     . Calcium Citrate-Vitamin D (CALCIUM CITRATE + D3 PO) 1200mg  calcium 2000 IU Vit. D. Take by mouth daily.    . Chromium-Cinnamon (CINNAMON PLUS CHROMIUM PO) 1000 mg cinnamon 100 mcg Chromium Take by mouth daily.    . cycloSPORINE (RESTASIS) 0.05 % ophthalmic emulsion Place 1 drop into both eyes 2 (two) times daily.    [name] estradiol (CLIMARA - DOSED IN MG/24 HR) 0.075 mg/24hr patch Place 1 patch (0.075 mg total) onto the skin once a week. 4 patch 12  . glucose blood test strip Use as instructed tid to check blood sugars. 300 each 3  . lisinopril (ZESTRIL) 10 MG tablet Take 1 tablet (10 mg total) by mouth daily. Per Dr Elease Hashimoto 90 tablet 3  . MAGNESIUM PO Take 250 mg by mouth daily.    . Multiple Vitamin (MULTIVITAMIN PO) Take 1 tablet by mouth daily.     [name] OVER THE COUNTER MEDICATION Omega - 3 1040 mg take one tab daily    . simvastatin (ZOCOR) 20 MG tablet Take 1 tablet (20 mg total) by mouth daily. 90 tablet 3  . Ultilet Classic  Lancets MISC Testing BS three times daily 300 each 3  . aspirin EC 81 MG tablet Take 81 mg by mouth daily. (Patient not taking: Reported on 01/24/2020)     Facility-Administered  Medications Prior to Visit  Medication Dose Route Frequency Provider Last Rate Last Admin  . 0.9 %  sodium chloride infusion  500 mL Intravenous Once Irene Shipper, MD        No Known Allergies  ROS Review of Systems  Constitutional: Negative for fatigue and unexpected weight change.  Eyes: Negative for visual disturbance.  Respiratory: Negative for cough, chest tightness, shortness of breath and wheezing.   Cardiovascular: Negative for chest pain, palpitations and leg swelling.  Gastrointestinal: Negative for abdominal pain.  Endocrine: Negative for polydipsia and polyuria.  Neurological: Negative for dizziness, seizures, syncope, weakness, light-headedness and headaches.      Objective:    Physical Exam Vitals reviewed.  Constitutional:      Appearance: Normal appearance.  Cardiovascular:     Rate and Rhythm: Normal rate and regular rhythm.  Pulmonary:     Effort: Pulmonary effort is normal.     Breath sounds: Normal breath sounds.  Musculoskeletal:     Cervical back: Neck supple.     Right lower leg: No edema.     Left lower leg: No edema.  Lymphadenopathy:     Cervical: No cervical adenopathy.  Neurological:     Mental Status: She is alert.     BP 122/76 (BP Location: Left Arm, Patient Position: Sitting, Cuff Size: Normal)   Pulse 66   Temp 98.1 F (36.7 C) (Oral)   Ht 5' (1.524 m)   Wt 124 lb 14.4 oz (56.7 kg)   SpO2 99%   BMI 24.39 kg/m  Wt Readings from Last 3 Encounters:  02/07/20 124 lb 14.4 oz (56.7 kg)  12/27/19 126 lb 3.2 oz (57.2 kg)  10/12/19 125 lb (56.7 kg)     Health Maintenance Due  Topic Date Due  . Hepatitis C Screening  Never done    There are no preventive care reminders to display for this patient.  Lab Results  Component Value Date   TSH 1.55  12/01/2018   Lab Results  Component Value Date   WBC 7.2 08/14/2019   HGB 13.6 08/14/2019   HCT 40.0 08/14/2019   MCV 97.3 08/14/2019   PLT 273.0 08/14/2019   Lab Results  Component Value Date   NA 141 04/28/2019   K 4.7 04/28/2019   CO2 29 04/28/2019   GLUCOSE 138 (H) 04/28/2019   BUN 19 04/28/2019   CREATININE 0.73 04/28/2019   BILITOT 0.7 04/28/2019   ALKPHOS 44 04/28/2019   AST 17 04/28/2019   ALT 17 04/28/2019   PROT 6.6 04/28/2019   ALBUMIN 4.6 04/28/2019   CALCIUM 9.8 04/28/2019   GFR 77.00 04/28/2019   Lab Results  Component Value Date   CHOL 151 04/28/2019   Lab Results  Component Value Date   HDL 59.20 04/28/2019   Lab Results  Component Value Date   LDLCALC 60 04/28/2019   Lab Results  Component Value Date   TRIG 163.0 (H) 04/28/2019   Lab Results  Component Value Date   CHOLHDL 3 04/28/2019   Lab Results  Component Value Date   HGBA1C 6.1 (A) 02/07/2020   HGBA1C 6.1 02/07/2020   HGBA1C 6.1 02/07/2020   HGBA1C 6.1 02/07/2020      Assessment & Plan:   #1 hypertension stable and at goal.  Continue lisinopril 10 mg daily  #2 history of diet-controlled type 2 diabetes.  A1c today 6.1%. -Continue lifestyle management and recheck in 6 months  #3 dyslipidemia.  Treated with simvastatin. -We will plan  to get fasting labs for lipid and hepatic panel at 70-month follow-up -We have recommend that she discontinue aspirin as she does not have any clear indications for secondary prevention and we discussed risk of using for primary prevention 79 year old.  She agrees.  #4 history of osteoporosis.  She has been on postmenopausal estrogen therapy for years per GYN.  She will continue to discuss pros and cons with them.  We had a long discussion regarding both risk and benefits of estrogen therapy.  She is aware of slightly high risk of clotting but is also aware of potential benefits with fracture reduction.    No orders of the defined types were  placed in this encounter.   Follow-up: Return in about 6 months (around 08/06/2020).    [name], MD

## 2020-02-07 NOTE — Patient Instructions (Signed)

## 2020-05-08 DIAGNOSIS — L814 Other melanin hyperpigmentation: Secondary | ICD-10-CM | POA: Diagnosis not present

## 2020-05-08 DIAGNOSIS — L905 Scar conditions and fibrosis of skin: Secondary | ICD-10-CM | POA: Diagnosis not present

## 2020-05-08 DIAGNOSIS — Z85828 Personal history of other malignant neoplasm of skin: Secondary | ICD-10-CM | POA: Diagnosis not present

## 2020-05-08 DIAGNOSIS — H61009 Unspecified perichondritis of external ear, unspecified ear: Secondary | ICD-10-CM | POA: Diagnosis not present

## 2020-05-08 DIAGNOSIS — L570 Actinic keratosis: Secondary | ICD-10-CM | POA: Diagnosis not present

## 2020-05-08 DIAGNOSIS — L820 Inflamed seborrheic keratosis: Secondary | ICD-10-CM | POA: Diagnosis not present

## 2020-05-23 ENCOUNTER — Ambulatory Visit (INDEPENDENT_AMBULATORY_CARE_PROVIDER_SITE_OTHER): Payer: Medicare Other | Admitting: Pharmacist

## 2020-05-23 DIAGNOSIS — E785 Hyperlipidemia, unspecified: Secondary | ICD-10-CM | POA: Diagnosis not present

## 2020-05-23 DIAGNOSIS — I10 Essential (primary) hypertension: Secondary | ICD-10-CM | POA: Diagnosis not present

## 2020-05-23 DIAGNOSIS — E119 Type 2 diabetes mellitus without complications: Secondary | ICD-10-CM

## 2020-05-23 NOTE — Progress Notes (Signed)
Chronic Care Management Pharmacy Note  06/03/2020 Name:  [name] MRN:  [id number] DOB:  [birth year] Howard  Subjective: [name] is an 80 y.o. year old female who is a primary patient of Burchette, Alinda Sierras, MD.  The CCM team was consulted for assistance with disease management and care coordination needs.    Engaged with patient by telephone for follow up visit in response to provider referral for pharmacy case management and/or care coordination services.   Consent to Services:  The patient was given information about Chronic Care Management services, agreed to services, and gave verbal consent prior to initiation of services.  Please see initial visit note for detailed documentation.   Patient Care Team: Taylor Post, MD as PCP - General (Family Medicine) [name], [facility] as Pharmacist (Pharmacist)  Recent office visits: 03/05/20 Taylor Littler, MD: Patient presented for DM follow up. A1c stable at 6.1%.  12/27/19 Taylor Littler, MD: Patient presented with radicular pain of sacrum. Patient has been on bone building therapy and developed a stress fracture in the past and is having similar pain. Pain is worse when sitting. Completed lumbar spine films and only mild degenerative changes of lumbar spine. No other acute bone changes.  12/18/19 Taylor Neas, LPN: Patient presented for Medicare annual wellness visit. No changes made.  08/14/19 Taylor Littler, MD: Patient presented with rectal bleeding and abdominal pain. CBC was normal.  Recent consult visits: -09/28/19 Taylor Aland, RN (endoscopy center): Patient presented prior for pre-colonoscopy appointment due to personal history of colonic polyps.   -08/17/19 [name] (optometry): Patient presented for eye exam. Unable to access notes.  Hospital visits: None in previous 6 months  Objective:  Lab Results  Component Value Date   CREATININE 0.73 04/28/2019   BUN 19 04/28/2019   GFR 77.00 04/28/2019   GFRNONAA 85  12/01/2018   GFRAA 98 12/01/2018   NA 141 04/28/2019   K 4.7 04/28/2019   CALCIUM 9.8 04/28/2019   CO2 29 04/28/2019    Lab Results  Component Value Date/Time   HGBA1C 6.1 (A) 02/07/2020 11:39 AM   HGBA1C 6.1 02/07/2020 11:39 AM   HGBA1C 6.1 02/07/2020 11:39 AM   HGBA1C 6.1 02/07/2020 11:39 AM   HGBA1C 6.1 (A) 07/28/2019 01:14 PM   HGBA1C 6.6 (H) 04/28/2019 11:19 AM   HGBA1C 6.6 (H) 04/30/2017 10:51 AM   GFR 77.00 04/28/2019 11:19 AM   GFR 81.03 04/29/2018 11:11 AM    Last diabetic Eye exam:  Lab Results  Component Value Date/Time   HMDIABEYEEXA No Retinopathy 05/25/2018 12:00 AM    Last diabetic Foot exam: No results found for: HMDIABFOOTEX   Lab Results  Component Value Date   CHOL 151 04/28/2019   HDL 59.20 04/28/2019   LDLCALC 60 04/28/2019   LDLDIRECT 74.0 04/08/2012   TRIG 163.0 (H) 04/28/2019   CHOLHDL 3 04/28/2019    Hepatic Function Latest Ref Rng & Units 04/28/2019 04/29/2018 04/30/2017  Total Protein 6.0 - 8.3 g/dL 6.6 6.6 6.4  Albumin 3.5 - 5.2 g/dL 4.6 4.6 4.4  AST 0 - 37 U/L _0 ALT 0 - 35 U/L _1 Alk Phosphatase 39 - 117 U/L 44 42 41  Total Bilirubin 0.2 - 1.2 mg/dL 0.7 0.8 0.9  Bilirubin, Direct 0.0 - 0.3 mg/dL 0.1 0.1 0.2    Lab Results  Component Value Date/Time   TSH 1.55 12/01/2018 12:01 PM   TSH 1.29 07/28/2012 02:45 PM    CBC  Latest Ref Rng & Units 08/14/2019 07/25/2012 08/25/2011  WBC 4.0 - 10.5 K/uL 7.2 7.4 7.0  Hemoglobin 12.0 - 15.0 g/dL 13.6 14.0 13.8  Hematocrit 36.0 - 46.0 % 40.0 38.1 40.9  Platelets 150.0 - 400.0 K/uL 273.0 273 248.0    Lab Results  Component Value Date/Time   VD25OH 45.51 12/01/2018 12:01 PM    Clinical ASCVD: No  The 10-year ASCVD risk score Mikey Bussing DC Jr., et al., 2013) is: 49.1%   Values used to calculate the score:     Age: 44 years     Sex: Female     Is Non-Hispanic African American: No     Diabetic: Yes     Tobacco smoker: No     Systolic Blood Pressure: 309 mmHg     Is BP treated:  Yes     HDL Cholesterol: 59.2 mg/dL     Total Cholesterol: 151 mg/dL    Depression screen [facility] 2/9 12/18/2019 07/28/2019 04/29/2018  Decreased Interest 0 0 0  Down, Depressed, Hopeless 0 0 0  PHQ - 2 Score 0 0 0  Altered sleeping 0 - 0  Tired, decreased energy 0 - 0  Change in appetite 0 - 0  Feeling bad or failure about yourself  0 - 0  Trouble concentrating 0 - 0  Moving slowly or fidgety/restless 0 - 0  Suicidal thoughts 0 - 0  PHQ-9 Score 0 - 0  Difficult doing work/chores Not difficult at all - Not difficult at all     Social History   Tobacco Use  Smoking Status Never Smoker  Smokeless Tobacco Never Used   BP Readings from Last 3 Encounters:  02/07/20 122/76  12/27/19 110/68  10/12/19 (!) 121/53   Pulse Readings from Last 3 Encounters:  02/07/20 66  12/27/19 67  10/12/19 65   Wt Readings from Last 3 Encounters:  02/07/20 124 lb 14.4 oz (56.7 kg)  12/27/19 126 lb 3.2 oz (57.2 kg)  10/12/19 125 lb (56.7 kg)    Assessment/Interventions: Review of patient past medical history, allergies, medications, health status, including review of consultants reports, laboratory and other test data, was performed as part of comprehensive evaluation and provision of chronic care management services.   SDOH:  (Social Determinants of Health) assessments and interventions performed: No   CCM Care Plan  No Known Allergies  Medications Reviewed Today    Reviewed by Taylor Post, MD (Physician) on 02/07/20 at 1150  Med List Status: <None>  Medication Order Taking? Sig Documenting Provider Last Dose Status Informant  0.9 %  sodium chloride infusion [id number]   Taylor Shipper, MD  Active   APPLE CIDER VINEGAR PO [id number] Yes Take 900 mg by mouth daily.  [provider] Taking Active   aspirin EC 81 MG tablet [id number] No Take 81 mg by mouth daily.  Patient not taking: Reported on 01/24/2020   [provider] Not Taking Active Self  Calcium Citrate-Vitamin D  (CALCIUM CITRATE + D3 PO) [id number] Yes 1293m calcium 2000 IU Vit. D. Take by mouth daily. [provider] Taking Active            Med Note (Thomes CakeSep 13, 2021  1:22 PM) 1200 MG calcium  2000 IU vitamin D   Chromium-Cinnamon (CINNAMON PLUS CHROMIUM PO) [id number] 1000 mg cinnamon 100 mcg Chromium Take by mouth daily. [provider] Taking Active            Med  Note Al Corpus, SHANNON R   Mon Dec 18, 2019  1:22 PM) 1000 mg of Cinnamon 100 mcg of chromium  cycloSPORINE (RESTASIS) 0.05 % ophthalmic emulsion [id number] Yes Place 1 drop into both eyes 2 (two) times daily. [provider] Taking Active   estradiol (CLIMARA - DOSED IN MG/24 HR) 0.075 mg/24hr patch [id number] Yes Place 1 patch (0.075 mg total) onto the skin once a week. Taylor Post, MD Taking Active   glucose blood test strip [id number] Yes Use as instructed tid to check blood sugars. Taylor Post, MD Taking Active   lisinopril (ZESTRIL) 10 MG tablet [id number] Yes Take 1 tablet (10 mg total) by mouth daily. Per Dr Janalyn Shy, MD Taking Active   MAGNESIUM PO [id number] Yes Take 250 mg by mouth daily. [provider] Taking Active   Multiple Vitamin (MULTIVITAMIN PO) [id number] Yes Take 1 tablet by mouth daily.  [provider] Taking Active Self  OVER THE COUNTER MEDICATION [id number] Yes Omega - 3 1040 mg take one tab daily [provider] Taking Active   simvastatin (ZOCOR) 20 MG tablet [id number] Yes Take 1 tablet (20 mg total) by mouth daily. Taylor Post, MD Taking Active   Ultilet Classic Lancets MISC [id number] Yes Testing BS three times daily Burchette, Alinda Sierras, MD Taking Active           Patient Active Problem List   Diagnosis Date Noted  . Type 2 diabetes mellitus, controlled ([place]) 06/19/2010  . Essential hypertension 06/19/2010  . Osteoporosis 04/28/2010  . PHARYNGITIS 08/01/2008  . DYSPNEA 07/30/2008  .  Hyperlipidemia 05/31/2008  . MIGRAINE HEADACHE 09/19/2007  . GERD 09/19/2007  . DIVERTICULOSIS, COLON 09/19/2007  . 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 SPONTANEOUS PNEUMOTHORAX 05/16/2007    Immunization History  Administered Date(s) Administered  . Fluad Quad(high Dose 65+) 12/15/2018, 12/19/2019  . Influenza Split 01/01/2012  . Influenza Whole 12/27/2007, 12/06/2010  . Influenza, High Dose Seasonal PF 01/11/2013, 12/07/2014, 12/06/2015, 12/02/2016  . Influenza,inj,Quad PF,6+ Mos 12/28/2013  . Influenza-Unspecified 12/01/2016, 12/28/2016, 12/15/2018  . PFIZER(Purple Top)SARS-COV-2 Vaccination 04/21/2019, 05/11/2019, 01/10/2020  . Pneumococcal Conjugate-13 04/06/2006, 10/11/2013  . Pneumococcal Polysaccharide-23 04/28/2016  . Tdap 01/29/2017  . Zoster 03/28/2007  . Zoster Recombinat (Shingrix) 10/26/2018, 10/26/2018, 12/30/2018, 12/30/2018   Tylenol - recommended   Conditions to be addressed/monitored:  Hypertension, Hyperlipidemia, Diabetes, GERD, Osteoporosis and menopause symptoms and chronic dry eye  Care Plan : [facility]  Updates made by [name], [place] since 06/03/2020 12:00 AM    Problem: Problem: Hypertension, Hyperlipidemia, Diabetes, GERD, Osteoporosis and menopause symptoms and chronic dry eye     Long-Range Goal: Patient-Specific Goal   Start Date: 05/23/2020  Expected End Date: 05/23/2021  This Visit's Progress: On track  Priority: High  Note:   Current Barriers:  . Unable to independently monitor therapeutic efficacy  Pharmacist Clinical Goal(s):  [name] Over the next 180 days, patient will achieve adherence to monitoring guidelines and medication adherence to achieve therapeutic efficacy . maintain control of blood pressure as evidenced by home blood pressure readings  through collaboration with PharmD and provider.   Interventions: . 1:1 collaboration with Taylor Post, MD regarding development and update  of comprehensive plan of care as evidenced by provider attestation and co-signature . Inter-disciplinary care team collaboration (see longitudinal plan of care) . Comprehensive medication review performed; medication list updated in electronic medical record  Hypertension (BP goal <140/90) -Controlled -Current treatment: .  Lisinopril 10 mg 1 tablet daily  -Medications previously tried: none  -Current home readings: not checking at home -Current dietary habits: did not discuss -Current exercise habits: did not discuss -Denies hypotensive/hypertensive symptoms -Educated on Exercise goal of 150 minutes per week; Importance of home blood pressure monitoring; Proper BP monitoring technique; -Counseled to monitor BP at home weekly, document, and provide log at future appointments -Recommended to continue current medication  Hyperlipidemia: (LDL goal < 100) -Controlled -Current treatment:  Simvastatin 20 mg 1 tablet daily   Fish oil 1000 mg 1 tablet daily -Medications previously tried: none  -Current dietary patterns: did not discuss -Current exercise habits: did not discuss -Educated on Cholesterol goals;  Exercise goal of 150 minutes per week; -Recommended to continue current medication  Diabetes (A1c goal <7%) -Controlled -Current medications: . No medications -Medications previously tried: none  -Current home glucose readings . fasting glucose: 130, 120-140s, 157, 161 . Howard prandial glucose: n/a -Denies hypoglycemic/hyperglycemic symptoms -Current meal patterns:  . breakfast: n/a  . lunch: n/a  . dinner: n/a . snacks: n/a . drinks: n/a -Current exercise: did not discuss -Educated onA1c and blood sugar goals; Prevention and management of hypoglycemic episodes; Benefits of routine self-monitoring of blood sugar; -Counseled to check feet daily and get yearly eye exams -Counseled on diet and exercise extensively Recommended alternating different times of day for  checking blood pressure  Osteoporosis (Goal prevent fractures and improve bone density) -Not ideally controlled -Last DEXA Scan: 09/2018 - Results of report not found - indicates T-score of < -2.5 in unknown location  T-Score femoral neck: n/a  T-Score total hip: n/a  T-Score lumbar spine: n/a  T-Score forearm radius: n/a  10-year probability of major osteoporotic fracture: n/a  10-year probability of hip fracture: n/a -Patient is a candidate for pharmacologic treatment due to T-score < 2.5 in unknown location -Current treatment  . Calcium citrate & vit D3 800 mg - 1000 units 1 tablet daily -Medications previously tried: Fosamax (atypical femur fracture after 3 years), patient has had a fracture with a previous medication and does not wish to try any others  -Recommend [phone] units of vitamin D daily. Recommend 1200 mg of calcium daily from dietary and supplemental sources. Recommend weight-bearing and muscle strengthening exercises for building and maintaining bone density. -Recommended to continue current medication  Chronic dry eye (Goal: minimize symptoms of dry eye) -Controlled -Current treatment  . Restasis 0.05% ophthalmic emulsion 1 drop in both eyes twice daily  -Medications previously tried: none  -Recommended to continue current medication Assessed patient finances. Patient is receiving through [place] and is approved until 04/05/21  GERD (Goal: minimize symptoms of heartburn or acid reflux) -Controlled -Current treatment  . No medications -Medications previously tried: n/a  -Counseled on diet and exercise extensively Counseled on continued use of Tums as needed  Menopausal symptoms (Goal: minimize Howard-menopausal symptoms) -Controlled -Current treatment  . Climara patch 0.075 mg/24 hour apply 1 patch once a week -Medications previously tried: none -Educated on risks for clotting, breast cancer and heart events with long term use of estrogen therapy  Health  Maintenance -Vaccine gaps: none -Current therapy:   Apple cider vinegar 1 tablet by mouth  Cinnamon plus chromium 2000 mg-200 mcg 1 tablet daily  Magnesium 250 mg 1 tablet daily   Multivitamin 1 tablet daily -Educated on Cost vs benefit of each product must be carefully weighed by individual consumer -Patient is satisfied with current therapy and denies issues -Recommended to continue current medication  Patient Goals/Self-Care Activities . Over the next 180 days, patient will:  - take medications as prescribed check blood pressure weekly, document, and provide at future appointments  Follow Up Plan: Telephone follow up appointment with care management team member scheduled for: 6 months       Medication Assistance: Restasisobtained through [place] medication assistance program.  Enrollment ends 04/05/2021  Patient's preferred pharmacy is:  [facility] [place], [place] - [address] [place] [address] Phone: [phone] Fax: [phone]  [facility], [place] - [address] [place] [address] Phone: [phone] Fax: [phone]  Uses pill box? No - all lined up and uses AM and PM cups Pt endorses 100% compliance   We discussed: Current pharmacy is preferred with insurance plan and patient is satisfied with pharmacy services Patient decided to: Continue current medication management strategy  Care Plan and Follow Up Patient Decision:  Patient agrees to Care Plan and Follow-up.  Plan: Telephone follow up appointment with care management team member scheduled for:  6 months  Jeni Salles, PharmD [place] Pharmacist [place] [facility] [place] [phone]

## 2020-05-31 ENCOUNTER — Other Ambulatory Visit: Payer: Self-pay | Admitting: Family Medicine

## 2020-06-03 NOTE — Patient Instructions (Signed)
Visit Information  Goals Addressed   None    Patient Care Plan: CCM Pharmacy Care Plan    Problem Identified: Problem: Hypertension, Hyperlipidemia, Diabetes, GERD, Osteoporosis and menopause symptoms and chronic dry eye     Long-Range Goal: Patient-Specific Goal   Start Date: 05/23/2020  Expected End Date: 05/23/2021  This Visit's Progress: On track  Priority: High  Note:   Current Barriers:  . Unable to independently monitor therapeutic efficacy  Pharmacist Clinical Goal(s):  [name] Over the next 180 days, patient will achieve adherence to monitoring guidelines and medication adherence to achieve therapeutic efficacy . maintain control of blood pressure as evidenced by home blood pressure readings  through collaboration with PharmD and provider.   Interventions: . 1:1 collaboration with Eulas Post, MD regarding development and update of comprehensive plan of care as evidenced by provider attestation and co-signature . Inter-disciplinary care team collaboration (see longitudinal plan of care) . Comprehensive medication review performed; medication list updated in electronic medical record  Hypertension (BP goal <140/90) -Controlled -Current treatment: . Lisinopril 10 mg 1 tablet daily  -Medications previously tried: none  -Current home readings: not checking at home -Current dietary habits: did not discuss -Current exercise habits: did not discuss -Denies hypotensive/hypertensive symptoms -Educated on Exercise goal of 150 minutes per week; Importance of home blood pressure monitoring; Proper BP monitoring technique; -Counseled to monitor BP at home weekly, document, and provide log at future appointments -Recommended to continue current medication  Hyperlipidemia: (LDL goal < 100) -Controlled -Current treatment:  Simvastatin 20 mg 1 tablet daily   Fish oil 1000 mg 1 tablet daily -Medications previously tried: none  -Current dietary patterns: did not  discuss -Current exercise habits: did not discuss -Educated on Cholesterol goals;  Exercise goal of 150 minutes per week; -Recommended to continue current medication  Diabetes (A1c goal <7%) -Controlled -Current medications: . No medications -Medications previously tried: none  -Current home glucose readings . fasting glucose: 130, 120-140s, 157, 161 . post prandial glucose: n/a -Denies hypoglycemic/hyperglycemic symptoms -Current meal patterns:  . breakfast: n/a  . lunch: n/a  . dinner: n/a . snacks: n/a . drinks: n/a -Current exercise: did not discuss -Educated onA1c and blood sugar goals; Prevention and management of hypoglycemic episodes; Benefits of routine self-monitoring of blood sugar; -Counseled to check feet daily and get yearly eye exams -Counseled on diet and exercise extensively Recommended alternating different times of day for checking blood pressure  Osteoporosis (Goal prevent fractures and improve bone density) -Not ideally controlled -Last DEXA Scan: 09/2018 - Results of report not found - indicates T-score of < -2.5 in unknown location  T-Score femoral neck: n/a  T-Score total hip: n/a  T-Score lumbar spine: n/a  T-Score forearm radius: n/a  10-year probability of major osteoporotic fracture: n/a  10-year probability of hip fracture: n/a -Patient is a candidate for pharmacologic treatment due to T-score < 2.5 in unknown location -Current treatment  . Calcium citrate & vit D3 800 mg - 1000 units 1 tablet daily -Medications previously tried: Fosamax (atypical femur fracture after 3 years), patient has had a fracture with a previous medication and does not wish to try any others  -Recommend [phone] units of vitamin D daily. Recommend 1200 mg of calcium daily from dietary and supplemental sources. Recommend weight-bearing and muscle strengthening exercises for building and maintaining bone density. -Recommended to continue current medication  Chronic dry  eye (Goal: minimize symptoms of dry eye) -Controlled -Current treatment  . Restasis 0.05% ophthalmic emulsion 1  drop in both eyes twice daily  -Medications previously tried: none  -Recommended to continue current medication Assessed patient finances. Patient is receiving through [place] and is approved until 04/05/21  GERD (Goal: minimize symptoms of heartburn or acid reflux) -Controlled -Current treatment  . No medications -Medications previously tried: n/a  -Counseled on diet and exercise extensively Counseled on continued use of Tums as needed  Menopausal symptoms (Goal: minimize post-menopausal symptoms) -Controlled -Current treatment  . Climara patch 0.075 mg/24 hour apply 1 patch once a week -Medications previously tried: none -Educated on risks for clotting, breast cancer and heart events with long term use of estrogen therapy  Health Maintenance -Vaccine gaps: none -Current therapy:   Apple cider vinegar 1 tablet by mouth  Cinnamon plus chromium 2000 mg-200 mcg 1 tablet daily  Magnesium 250 mg 1 tablet daily   Multivitamin 1 tablet daily -Educated on Cost vs benefit of each product must be carefully weighed by individual consumer -Patient is satisfied with current therapy and denies issues -Recommended to continue current medication  Patient Goals/Self-Care Activities . Over the next 180 days, patient will:  - take medications as prescribed check blood pressure weekly, document, and provide at future appointments  Follow Up Plan: Telephone follow up appointment with care management team member scheduled for: 6 months       The patient verbalized understanding of instructions, educational materials, and care plan provided today and declined offer to receive copy of patient instructions, educational materials, and care plan.  Telephone follow up appointment with pharmacy team member scheduled for:6 months  Viona Gilmore, [facility]

## 2020-06-17 ENCOUNTER — Telehealth: Payer: Self-pay | Admitting: Family Medicine

## 2020-06-17 NOTE — Telephone Encounter (Signed)
fyi

## 2020-06-17 NOTE — Telephone Encounter (Signed)
Patient is calling to advise Dr. Elease Hashimoto should be receiving a fax from [facility] for a new prescription for blood sugar testing supplies.   FYI

## 2020-06-26 ENCOUNTER — Other Ambulatory Visit: Payer: Self-pay

## 2020-06-26 ENCOUNTER — Encounter: Payer: Self-pay | Admitting: Family Medicine

## 2020-06-26 ENCOUNTER — Ambulatory Visit (INDEPENDENT_AMBULATORY_CARE_PROVIDER_SITE_OTHER): Payer: Medicare Other

## 2020-06-26 ENCOUNTER — Ambulatory Visit (INDEPENDENT_AMBULATORY_CARE_PROVIDER_SITE_OTHER): Payer: Medicare Other | Admitting: Family Medicine

## 2020-06-26 VITALS — BP 124/70 | HR 82 | Temp 98.2°F | Ht 60.0 in | Wt 122.1 lb

## 2020-06-26 DIAGNOSIS — M810 Age-related osteoporosis without current pathological fracture: Secondary | ICD-10-CM | POA: Diagnosis not present

## 2020-06-26 DIAGNOSIS — M79604 Pain in right leg: Secondary | ICD-10-CM | POA: Diagnosis not present

## 2020-06-26 DIAGNOSIS — M79651 Pain in right thigh: Secondary | ICD-10-CM | POA: Diagnosis not present

## 2020-06-26 DIAGNOSIS — E119 Type 2 diabetes mellitus without complications: Secondary | ICD-10-CM

## 2020-06-26 DIAGNOSIS — M25551 Pain in right hip: Secondary | ICD-10-CM

## 2020-06-26 MED ORDER — ESTRADIOL 0.075 MG/24HR TD PTWK
0.0750 mg | MEDICATED_PATCH | TRANSDERMAL | 11 refills | Status: DC
Start: 2020-06-26 — End: 2021-08-12

## 2020-06-26 NOTE — Progress Notes (Signed)
Established Patient Office Visit  Subjective:  Patient ID: [name], female    DOB: [birth year]  Age: 80 y.o. MRN: [id number]  CC:  Chief Complaint  Patient presents with  . discuss x-ray    Pt is having sharp pains in her hip area - wanting to know if she needs an x-ray.    HPI [name] presents for right hip pain.  She describes sharp pain which is really almost more in the buttock region but radiates somewhat anteriorly.  No groin pain.  Pain is described as sharp and very transient lasting only a few seconds.  Denies any low back pain.  Denies any lower extremity numbness or weakness.  No rashes.  Denies any lower extremity paresthesias.  Her concern is that she had spontaneous fracture left femur which was felt to be related to chronic bisphosphonate use.  This occurred back in 2019.  She has not had any recent falls or injuries.  She remains on estradiol patch.  She takes calcium and vitamin D.  Other chronic problems include type 2 diabetes which is been well controlled, hypertension, hyperlipidemia.  We have scheduled follow-up in May and plan to get follow-up labs then.  Her blood sugars been very well controlled.  Past Medical History:  Diagnosis Date  . Broken femur ([name])   . Cancer ([name]) 2001   skin, nose  . Cataract   . Diabetes mellitus    Borderline/no meds  . Diverticulosis of colon   . DNR (do not resuscitate) 2009  . GERD (gastroesophageal reflux disease)   . Heart murmur   . Hyperlipidemia   . Hypertension   . Kidney stones    1 time  . Migraines    during menopause  . Osteoporosis 09/2018   T score -2.7 stable from prior DEXA.  [name] Pneumothorax on left     Past Surgical History:  Procedure Laterality Date  . ABDOMINAL HYSTERECTOMY  1994   fibroids/ complete  . BUNIONECTOMY  2004   left  . CARPAL TUNNEL RELEASE  2000   right   . CATARACT EXTRACTION     Bil  . COLONOSCOPY  10/24/2013  . FEMUR FRACTURE SURGERY    . LUNG SURGERY  2008   left  lung VATS for biopsy and removal of blebs with pleurodesis  . OOPHORECTOMY     BSO  . POLYPECTOMY    . TONSILLECTOMY     as child    Family History  Problem Relation Age of Onset  . Cancer Mother        bone  . Diabetes Sister   . Hyperlipidemia Sister   . Ovarian cancer Sister   . Pulmonary embolism Father   . Cancer Father        Facial  . Cancer Sister        melanoma  . Emphysema Maternal Aunt   . Breast cancer Neg Hx   . Colon cancer Neg Hx   . Colon polyps Neg Hx   . Esophageal cancer Neg Hx   . Rectal cancer Neg Hx   . Stomach cancer Neg Hx     Social History   Socioeconomic History  . Marital status: Divorced    Spouse name: Not on file  . Number of children: Not on file  . Years of education: Not on file  . Highest education level: Not on file  Occupational History  . Not on file  Tobacco Use  . Smoking  status: Never Smoker  . Smokeless tobacco: Never Used  Vaping Use  . Vaping Use: Never used  Substance and Sexual Activity  . Alcohol use: No    Alcohol/week: 0.0 standard drinks  . Drug use: No  . Sexual activity: Not Currently    Comment: 1st intercourse 80 yo-Fewer than 5 partners  Other Topics Concern  . Not on file  Social History Narrative  . Not on file   Social Determinants of Health   Financial Resource Strain: Low Risk   . Difficulty of Paying Living Expenses: Not very hard  Food Insecurity: No Food Insecurity  . Worried About Charity fundraiser in the Last Year: Never true  . Ran Out of Food in the Last Year: Never true  Transportation Needs: No Transportation Needs  . Lack of Transportation (Medical): No  . Lack of Transportation (Non-Medical): No  Physical Activity: Inactive  . Days of Exercise per Week: 0 days  . Minutes of Exercise per Session: 0 min  Stress: No Stress Concern Present  . Feeling of Stress : Not at all  Social Connections: Moderately Isolated  . Frequency of Communication with Friends and Family: More than  three times a week  . Frequency of Social Gatherings with Friends and Family: Once a week  . Attends Religious Services: More than 4 times per year  . Active Member of Clubs or Organizations: No  . Attends Archivist Meetings: Never  . Marital Status: Divorced  Human resources officer Violence: Not At Risk  . Fear of Current or Ex-Partner: No  . Emotionally Abused: No  . Physically Abused: No  . Sexually Abused: No    Outpatient Medications Prior to Visit  Medication Sig Dispense Refill  . APPLE CIDER VINEGAR PO Take 900 mg by mouth daily.     . Calcium Citrate-Vitamin D (CALCIUM CITRATE + D3 PO) 1200mg  calcium 2000 IU Vit. D. Take by mouth daily.    . Chromium-Cinnamon (CINNAMON PLUS CHROMIUM PO) 1000 mg cinnamon 100 mcg Chromium Take by mouth daily.    . cycloSPORINE (RESTASIS) 0.05 % ophthalmic emulsion Place 1 drop into both eyes 2 (two) times daily.    [name] glucose blood test strip Use as instructed tid to check blood sugars. 300 each 3  . lisinopril (ZESTRIL) 10 MG tablet TAKE 1 TABLET (10 MG TOTAL) BY MOUTH DAILY. PER DR Rether Rison 90 tablet 3  . MAGNESIUM PO Take 250 mg by mouth daily.    . Multiple Vitamin (MULTIVITAMIN PO) Take 1 tablet by mouth daily.    [name] OVER THE COUNTER MEDICATION Omega - 3 1040 mg take one tab daily    . simvastatin (ZOCOR) 20 MG tablet TAKE 1 TABLET BY MOUTH EVERY DAY 90 tablet 3  . Ultilet Classic Lancets MISC Testing BS three times daily 300 each 3  . estradiol (CLIMARA - DOSED IN MG/24 HR) 0.075 mg/24hr patch Place 1 patch (0.075 mg total) onto the skin once a week. 4 patch 12  . 0.9 %  sodium chloride infusion      No facility-administered medications prior to visit.    No Known Allergies  ROS Review of Systems  Constitutional: Negative for chills, fatigue and fever.  Eyes: Negative for visual disturbance.  Respiratory: Negative for cough, chest tightness, shortness of breath and wheezing.   Cardiovascular: Negative for chest pain,  palpitations and leg swelling.  Genitourinary: Negative for dysuria.  Musculoskeletal: Negative for arthralgias.  Neurological: Negative for dizziness, seizures, syncope, weakness,  light-headedness and headaches.      Objective:    Physical Exam Vitals reviewed.  Constitutional:      Appearance: Normal appearance.  Cardiovascular:     Rate and Rhythm: Normal rate and regular rhythm.  Pulmonary:     Effort: Pulmonary effort is normal.     Breath sounds: Normal breath sounds.  Musculoskeletal:     Right lower leg: No edema.     Left lower leg: No edema.     Comments: Right hip reveals no lateral tenderness over the bursa region.  She has excellent range of motion right hip with no pain with internal or external rotation.  Neurological:     Mental Status: She is alert.     Comments: Full strength lower extremities with symmetric reflexes ankle and knee bilaterally.     BP 124/70   Pulse 82   Temp 98.2 F (36.8 C) (Oral)   Ht 5' (1.524 m)   Wt 122 lb 1 oz (55.4 kg)   SpO2 97%   BMI 23.84 kg/m  Wt Readings from Last 3 Encounters:  06/26/20 122 lb 1 oz (55.4 kg)  02/07/20 124 lb 14.4 oz (56.7 kg)  12/27/19 126 lb 3.2 oz (57.2 kg)     Health Maintenance Due  Topic Date Due  . Hepatitis C Screening  Never done  . FOOT EXAM  04/27/2020    There are no preventive care reminders to display for this patient.  Lab Results  Component Value Date   TSH 1.55 12/01/2018   Lab Results  Component Value Date   WBC 7.2 08/14/2019   HGB 13.6 08/14/2019   HCT 40.0 08/14/2019   MCV 97.3 08/14/2019   PLT 273.0 08/14/2019   Lab Results  Component Value Date   NA 141 04/28/2019   K 4.7 04/28/2019   CO2 29 04/28/2019   GLUCOSE 138 (H) 04/28/2019   BUN 19 04/28/2019   CREATININE 0.73 04/28/2019   BILITOT 0.7 04/28/2019   ALKPHOS 44 04/28/2019   AST 17 04/28/2019   ALT 17 04/28/2019   PROT 6.6 04/28/2019   ALBUMIN 4.6 04/28/2019   CALCIUM 9.8 04/28/2019   GFR 77.00  04/28/2019   Lab Results  Component Value Date   CHOL 151 04/28/2019   Lab Results  Component Value Date   HDL 59.20 04/28/2019   Lab Results  Component Value Date   LDLCALC 60 04/28/2019   Lab Results  Component Value Date   TRIG 163.0 (H) 04/28/2019   Lab Results  Component Value Date   CHOLHDL 3 04/28/2019   Lab Results  Component Value Date   HGBA1C 6.1 (A) 02/07/2020   HGBA1C 6.1 02/07/2020   HGBA1C 6.1 02/07/2020   HGBA1C 6.1 02/07/2020      Assessment & Plan:   #1 right hip pain.  She is describing acute onset earlier today of sharp pain somewhat poorly localized right buttock radiating anteriorly.  Hip exam otherwise unremarkable.  Her concern is spontaneous fracture left proximal femur after prolonged therapy on Fosamax.  No recent injury.  -Obtain plain x-rays right hip and right femur. -We explained that things like stress fractures may not show up on plain x-ray.  If she has persistent pain or progressive pain consider MRI scan even if x-ray is normal  #2 type 2 diabetes with history of excellent control.  She has follow-up in May and plan to get A1c along with other labs at that point  #3 history of osteoporosis.  She has had previous left proximal femur fracture which was spontaneous and not related injury.  This was on Fosamax.   Meds ordered this encounter  Medications  . estradiol (CLIMARA - DOSED IN MG/24 HR) 0.075 mg/24hr patch    Sig: Place 1 patch (0.075 mg total) onto the skin once a week.    Dispense:  4 patch    Refill:  11    Follow-up: No follow-ups on file.    [name], MD

## 2020-06-26 NOTE — Patient Instructions (Signed)
We will be in touch after X-ray back  Even if X-ray normal and pain persists or worsens be in touch and we might need to consider MRI scan.

## 2020-08-05 ENCOUNTER — Other Ambulatory Visit: Payer: Self-pay

## 2020-08-06 ENCOUNTER — Ambulatory Visit (INDEPENDENT_AMBULATORY_CARE_PROVIDER_SITE_OTHER): Payer: Medicare Other | Admitting: Family Medicine

## 2020-08-06 ENCOUNTER — Encounter: Payer: Self-pay | Admitting: Family Medicine

## 2020-08-06 VITALS — BP 124/62 | HR 66 | Temp 98.2°F | Wt 123.3 lb

## 2020-08-06 DIAGNOSIS — M810 Age-related osteoporosis without current pathological fracture: Secondary | ICD-10-CM

## 2020-08-06 DIAGNOSIS — E119 Type 2 diabetes mellitus without complications: Secondary | ICD-10-CM | POA: Diagnosis not present

## 2020-08-06 DIAGNOSIS — E785 Hyperlipidemia, unspecified: Secondary | ICD-10-CM

## 2020-08-06 DIAGNOSIS — I10 Essential (primary) hypertension: Secondary | ICD-10-CM | POA: Diagnosis not present

## 2020-08-06 LAB — LIPID PANEL
Cholesterol: 152 mg/dL (ref 0–200)
HDL: 61.5 mg/dL (ref >39.00)
LDL Cholesterol: 66 mg/dL (ref 0–99)
NonHDL: 90.33
Total CHOL/HDL Ratio: 2
Triglycerides: 124 mg/dL (ref 0.0–149.0)
VLDL: 24.8 mg/dL (ref 0.0–40.0)

## 2020-08-06 LAB — POCT GLYCOSYLATED HEMOGLOBIN (HGB A1C): Hemoglobin A1C: 6.3 % — AB (ref 4.0–5.6)

## 2020-08-06 LAB — BASIC METABOLIC PANEL
BUN: 15 mg/dL (ref 6–23)
CO2: 30 mEq/L (ref 19–32)
Calcium: 9.7 mg/dL (ref 8.4–10.5)
Chloride: 104 mEq/L (ref 96–112)
Creatinine, Ser: 0.69 mg/dL (ref 0.40–1.20)
GFR: 82.33 mL/min (ref >60.00)
Glucose, Bld: 108 mg/dL — ABNORMAL HIGH (ref 70–99)
Potassium: 4.8 mEq/L (ref 3.5–5.1)
Sodium: 140 mEq/L (ref 135–145)

## 2020-08-06 LAB — HEPATIC FUNCTION PANEL
ALT: 16 U/L (ref 0–35)
AST: 16 U/L (ref 0–37)
Albumin: 4.7 g/dL (ref 3.5–5.2)
Alkaline Phosphatase: 41 U/L (ref 39–117)
Bilirubin, Direct: 0.1 mg/dL (ref 0.0–0.3)
Total Bilirubin: 0.9 mg/dL (ref 0.2–1.2)
Total Protein: 6.6 g/dL (ref 6.0–8.3)

## 2020-08-06 NOTE — Progress Notes (Signed)
Established Patient Office Visit  Subjective:  Patient ID: [name], female    DOB: 07/30/1940  Age: 80 y.o. MRN: [id number]  CC:  Chief Complaint  Patient presents with  . Follow-up    Diabetes, hypertension and labs    HPI [name] presents for medical follow-up.  She has history of migraine headaches, hypertension, type 2 diabetes, osteoporosis, hyperlipidemia.  She has aortic murmur over the right upper chest region.  She had echocardiogram 2014 which showed possible bicuspid valve but no aortic stenosis.  She denies any dizziness or syncope with exercise.  Diabetes has been very well controlled.  A1c's have consistently been in the low 6 range.  She is fairly diligent with regard to diet.  Has been able to manage this without medications.  She remains on simvastatin 20 mg daily for hyperlipidemia.  She is on lisinopril 10 mg daily for hypertension.  Blood pressures have been fairly consistently well controlled.  She has osteoporosis history.  Due for DEXA scan follow-up soon.  Still getting yearly mammograms.  She is on estrogen patch.  Past Medical History:  Diagnosis Date  . Broken femur ([place])   . Cancer ([place]) 2001   skin, nose  . Cataract   . Diabetes mellitus    Borderline/no meds  . Diverticulosis of colon   . DNR (do not resuscitate) 2009  . GERD (gastroesophageal reflux disease)   . Heart murmur   . Hyperlipidemia   . Hypertension   . Kidney stones    1 time  . Migraines    during menopause  . Osteoporosis 09/2018   T score -2.7 stable from prior DEXA.  [name] Pneumothorax on left     Past Surgical History:  Procedure Laterality Date  . ABDOMINAL HYSTERECTOMY  1994   fibroids/ complete  . BUNIONECTOMY  2004   left  . CARPAL TUNNEL RELEASE  2000   right   . CATARACT EXTRACTION     Bil  . COLONOSCOPY  10/24/2013  . FEMUR FRACTURE SURGERY    . LUNG SURGERY  2008   left lung VATS for biopsy and removal of blebs with pleurodesis  . OOPHORECTOMY      BSO  . POLYPECTOMY    . TONSILLECTOMY     as child    Family History  Problem Relation Age of Onset  . Cancer Mother        bone  . Diabetes Sister   . Hyperlipidemia Sister   . Ovarian cancer Sister   . Pulmonary embolism Father   . Cancer Father        Facial  . Cancer Sister        melanoma  . Emphysema Maternal Aunt   . Breast cancer Neg Hx   . Colon cancer Neg Hx   . Colon polyps Neg Hx   . Esophageal cancer Neg Hx   . Rectal cancer Neg Hx   . Stomach cancer Neg Hx     Social History   Socioeconomic History  . Marital status: Divorced    Spouse name: Not on file  . Number of children: Not on file  . Years of education: Not on file  . Highest education level: Not on file  Occupational History  . Not on file  Tobacco Use  . Smoking status: Never Smoker  . Smokeless tobacco: Never Used  Vaping Use  . Vaping Use: Never used  Substance and Sexual Activity  . Alcohol use: No  Alcohol/week: 0.0 standard drinks  . Drug use: No  . Sexual activity: Not Currently    Comment: 1st intercourse 80 yo-Fewer than 5 partners  Other Topics Concern  . Not on file  Social History Narrative  . Not on file   Social Determinants of Health   Financial Resource Strain: Low Risk   . Difficulty of Paying Living Expenses: Not very hard  Food Insecurity: No Food Insecurity  . Worried About Charity fundraiser in the Last Year: Never true  . Ran Out of Food in the Last Year: Never true  Transportation Needs: No Transportation Needs  . Lack of Transportation (Medical): No  . Lack of Transportation (Non-Medical): No  Physical Activity: Inactive  . Days of Exercise per Week: 0 days  . Minutes of Exercise per Session: 0 min  Stress: No Stress Concern Present  . Feeling of Stress : Not at all  Social Connections: Moderately Isolated  . Frequency of Communication with Friends and Family: More than three times a week  . Frequency of Social Gatherings with Friends and Family:  Once a week  . Attends Religious Services: More than 4 times per year  . Active Member of Clubs or Organizations: No  . Attends Archivist Meetings: Never  . Marital Status: Divorced  Human resources officer Violence: Not At Risk  . Fear of Current or Ex-Partner: No  . Emotionally Abused: No  . Physically Abused: No  . Sexually Abused: No    Outpatient Medications Prior to Visit  Medication Sig Dispense Refill  . APPLE CIDER VINEGAR PO Take 900 mg by mouth daily.     . Calcium Citrate-Vitamin D (CALCIUM CITRATE + D3 PO) 1200mg  calcium 2000 IU Vit. D. Take by mouth daily.    . Chromium-Cinnamon (CINNAMON PLUS CHROMIUM PO) 1000 mg cinnamon 100 mcg Chromium Take by mouth daily.    . cycloSPORINE (RESTASIS) 0.05 % ophthalmic emulsion Place 1 drop into both eyes 2 (two) times daily.    [name] estradiol (CLIMARA - DOSED IN MG/24 HR) 0.075 mg/24hr patch Place 1 patch (0.075 mg total) onto the skin once a week. 4 patch 11  . glucose blood test strip Use as instructed tid to check blood sugars. 300 each 3  . lisinopril (ZESTRIL) 10 MG tablet TAKE 1 TABLET (10 MG TOTAL) BY MOUTH DAILY. PER DR Marvis Saefong 90 tablet 3  . MAGNESIUM PO Take 250 mg by mouth daily.    . Multiple Vitamin (MULTIVITAMIN PO) Take 1 tablet by mouth daily.    [name] OVER THE COUNTER MEDICATION Omega - 3 1040 mg take one tab daily    . simvastatin (ZOCOR) 20 MG tablet TAKE 1 TABLET BY MOUTH EVERY DAY 90 tablet 3  . Ultilet Classic Lancets MISC Testing BS three times daily 300 each 3   No facility-administered medications prior to visit.    No Known Allergies  ROS Review of Systems  Constitutional: Negative for fatigue.  Eyes: Negative for visual disturbance.  Respiratory: Negative for cough, chest tightness, shortness of breath and wheezing.   Cardiovascular: Negative for chest pain, palpitations and leg swelling.  Neurological: Negative for dizziness, seizures, syncope, weakness, light-headedness and headaches.       Objective:    Physical Exam Vitals reviewed.  Constitutional:      Appearance: Normal appearance.  Cardiovascular:     Rate and Rhythm: Normal rate and regular rhythm.     Heart sounds: Murmur heard.      Comments: She  has 2/6 systolic murmur right upper sternal border. Pulmonary:     Effort: Pulmonary effort is normal.     Breath sounds: Normal breath sounds.  Musculoskeletal:     Cervical back: Neck supple.     Right lower leg: No edema.     Left lower leg: No edema.  Lymphadenopathy:     Cervical: No cervical adenopathy.  Neurological:     Mental Status: She is alert.     BP 124/62 (BP Location: Left Arm, Patient Position: Sitting, Cuff Size: Normal)   Pulse 66   Temp 98.2 F (36.8 C) (Oral)   Wt 123 lb 4.8 oz (55.9 kg)   SpO2 96%   BMI 24.08 kg/m  Wt Readings from Last 3 Encounters:  08/06/20 123 lb 4.8 oz (55.9 kg)  06/26/20 122 lb 1 oz (55.4 kg)  02/07/20 124 lb 14.4 oz (56.7 kg)     Health Maintenance Due  Topic Date Due  . Hepatitis C Screening  Never done  . FOOT EXAM  04/27/2020    There are no preventive care reminders to display for this patient.  Lab Results  Component Value Date   TSH 1.55 12/01/2018   Lab Results  Component Value Date   WBC 7.2 08/14/2019   HGB 13.6 08/14/2019   HCT 40.0 08/14/2019   MCV 97.3 08/14/2019   PLT 273.0 08/14/2019   Lab Results  Component Value Date   NA 141 04/28/2019   K 4.7 04/28/2019   CO2 29 04/28/2019   GLUCOSE 138 (H) 04/28/2019   BUN 19 04/28/2019   CREATININE 0.73 04/28/2019   BILITOT 0.7 04/28/2019   ALKPHOS 44 04/28/2019   AST 17 04/28/2019   ALT 17 04/28/2019   PROT 6.6 04/28/2019   ALBUMIN 4.6 04/28/2019   CALCIUM 9.8 04/28/2019   GFR 77.00 04/28/2019   Lab Results  Component Value Date   CHOL 151 04/28/2019   Lab Results  Component Value Date   HDL 59.20 04/28/2019   Lab Results  Component Value Date   LDLCALC 60 04/28/2019   Lab Results  Component Value Date   TRIG  163.0 (H) 04/28/2019   Lab Results  Component Value Date   CHOLHDL 3 04/28/2019   Lab Results  Component Value Date   HGBA1C 6.3 (A) 08/06/2020      Assessment & Plan:   #1 type 2 diabetes well controlled with A1c 6.3% -Continue low glycemic diet -Reassess A1c in 6 months  #2 hypertension stable and well-controlled -Continue lisinopril 10 mg daily -Check basic metabolic panel  #3 dyslipidemia treated with simvastatin 20 mg daily -Check lipid and hepatic panel  #4 osteoporosis. -Schedule Future DEXA scan for sometime after June 18.  #5 aortic valve murmur.  Question of bicuspid valve from previous echo several years ago.  No evidence for aortic stenosis at that time.  She has not had any worrisome symptoms such as dizziness or syncope.  No orders of the defined types were placed in this encounter.   Follow-up: Return in about 6 months (around 02/06/2021).    [name], MD

## 2020-08-06 NOTE — Patient Instructions (Signed)
I will set up repeat Dexa scan  Let's plan on 6 month follow up.

## 2020-08-09 ENCOUNTER — Encounter: Payer: Self-pay | Admitting: Family Medicine

## 2020-08-19 ENCOUNTER — Other Ambulatory Visit: Payer: Self-pay | Admitting: Family Medicine

## 2020-08-19 ENCOUNTER — Telehealth: Payer: Self-pay | Admitting: Family Medicine

## 2020-08-19 DIAGNOSIS — Z1231 Encounter for screening mammogram for malignant neoplasm of breast: Secondary | ICD-10-CM

## 2020-08-19 NOTE — Telephone Encounter (Signed)
Pt is calling in needing her lab results sent to her to the address on file (address was verified).

## 2020-08-20 NOTE — Telephone Encounter (Signed)
Lab results have been mailed. Nothing further needed.

## 2020-09-04 DIAGNOSIS — Z1152 Encounter for screening for COVID-19: Secondary | ICD-10-CM | POA: Diagnosis not present

## 2020-09-04 DIAGNOSIS — U071 COVID-19: Secondary | ICD-10-CM | POA: Diagnosis not present

## 2020-09-12 ENCOUNTER — Telehealth: Payer: Self-pay | Admitting: Family Medicine

## 2020-09-12 NOTE — Telephone Encounter (Signed)
Patient called back and wanted to correct the date she came positive for covid and it was 5/25.

## 2020-09-12 NOTE — Telephone Encounter (Signed)
Patient started having COVID symptoms 05/18  COVID + on 06/01 COVID + on 06/04 COVID - on 06/09  She wants to know if she should still get the COVID booster  Please advise

## 2020-09-13 NOTE — Telephone Encounter (Signed)
Spoke with the patient, she is aware of Dr. Erick Blinks message below. Nothing further needed.

## 2020-09-26 DIAGNOSIS — E119 Type 2 diabetes mellitus without complications: Secondary | ICD-10-CM | POA: Diagnosis not present

## 2020-09-26 LAB — HM DIABETES EYE EXAM

## 2020-09-27 DIAGNOSIS — Z23 Encounter for immunization: Secondary | ICD-10-CM | POA: Diagnosis not present

## 2020-10-15 DIAGNOSIS — Z20822 Contact with and (suspected) exposure to covid-19: Secondary | ICD-10-CM | POA: Diagnosis not present

## 2020-10-22 ENCOUNTER — Ambulatory Visit
Admission: RE | Admit: 2020-10-22 | Discharge: 2020-10-22 | Disposition: A | Discharge status: Home / Self Care | Payer: Medicare Other | Source: Ambulatory Visit | Attending: Family Medicine | Admitting: Family Medicine

## 2020-10-22 ENCOUNTER — Other Ambulatory Visit: Payer: Self-pay

## 2020-10-22 DIAGNOSIS — Z1231 Encounter for screening mammogram for malignant neoplasm of breast: Secondary | ICD-10-CM

## 2020-11-05 DIAGNOSIS — C44519 Basal cell carcinoma of skin of other part of trunk: Secondary | ICD-10-CM | POA: Diagnosis not present

## 2020-11-05 DIAGNOSIS — Z85828 Personal history of other malignant neoplasm of skin: Secondary | ICD-10-CM | POA: Diagnosis not present

## 2020-11-05 DIAGNOSIS — L820 Inflamed seborrheic keratosis: Secondary | ICD-10-CM | POA: Diagnosis not present

## 2020-11-05 DIAGNOSIS — L905 Scar conditions and fibrosis of skin: Secondary | ICD-10-CM | POA: Diagnosis not present

## 2020-11-05 DIAGNOSIS — D485 Neoplasm of uncertain behavior of skin: Secondary | ICD-10-CM | POA: Diagnosis not present

## 2020-11-13 DIAGNOSIS — C44519 Basal cell carcinoma of skin of other part of trunk: Secondary | ICD-10-CM | POA: Diagnosis not present

## 2020-11-13 DIAGNOSIS — H61001 Unspecified perichondritis of right external ear: Secondary | ICD-10-CM | POA: Diagnosis not present

## 2020-11-20 ENCOUNTER — Telehealth: Payer: Self-pay | Admitting: Pharmacist

## 2020-11-20 NOTE — Chronic Care Management (AMB) (Signed)
    Chronic Care Management Pharmacy Assistant   Name: [name]  MRN: [id number] DOB: [birth year]  11/20/20-  Called patient to remind of appointment with Jeni Salles) on (11/21/20 at 10am via telephone.)   Patient aware of appointment date, time, and type of appointment (either telephone or in person). Patient aware to have/bring all medications, supplements, blood pressure and/or blood sugar logs to visit.  Questions: Have you had any recent office visit or specialist visit outside of [place]? Yes. Patient has seen a skin surgeon.  Are there any concerns you would like to discuss during your office visit? Patient has no concerns at this time.   Are you having any problems obtaining your medications? No issues at this time.  If patient has any PAP medications ask if they are having any problems getting their PAP medication or refill? Yes. Patient gets [employer] patient assistance.  Care Gaps:  AWV - scheduled for 12/18/20 Foot exam - overdue since 04/27/20 Flu vaccine - due  Star Rating Drug:  Lisinopril '10mg'$  - last filled on 08/31/20 90DS at [facility] Simvastatin '20mg'$  - last filled on 08/31/20 90[facility] at [facility]  Any gaps in medications fill history? No.  [place]  Clinical Pharmacist Assistant [phone]

## 2020-11-21 ENCOUNTER — Ambulatory Visit (INDEPENDENT_AMBULATORY_CARE_PROVIDER_SITE_OTHER): Payer: Medicare Other | Admitting: Pharmacist

## 2020-11-21 DIAGNOSIS — E119 Type 2 diabetes mellitus without complications: Secondary | ICD-10-CM

## 2020-11-21 DIAGNOSIS — I10 Essential (primary) hypertension: Secondary | ICD-10-CM | POA: Diagnosis not present

## 2020-11-21 NOTE — Progress Notes (Signed)
Chronic Care Management Pharmacy Note  11/21/2020 Name:  [name] MRN:  [id number] DOB:  March 09, 1941  Summary: BP is at goal < 140/90 per office readings DEXA scan scheduled for Nov 2022   Recommendations/Changes made from today's visit: -Recommended purchasing children's sized BP arm cuff as her current one is too large -Recommended routine home monitoring of BP   Plan: Follow up in December for Restasis PAP renewal  Subjective: [name] is an 80 y.o. year old female who is a primary patient of Burchette, Alinda Sierras, MD.  The CCM team was consulted for assistance with disease management and care coordination needs.    Engaged with patient by telephone for follow up visit in response to provider referral for pharmacy case management and/or care coordination services.   Consent to Services:  The patient was given information about Chronic Care Management services, agreed to services, and gave verbal consent prior to initiation of services.  Please see initial visit note for detailed documentation.   Patient Care Team: Eulas Post, MD as PCP - General (Family Medicine) Viona Gilmore, [facility] as Pharmacist (Pharmacist)  Recent office visits: 08/06/20 [name], MD: Patient presented for DM follow up. A1c stable at 6.3%. Recommended scheduling DEXA.  06/26/20 [name], MD: Patient presented for right hip pain.   Recent consult visits: 09/28/19 Sundra Aland, RN (endoscopy center): Patient presented prior for pre-colonoscopy appointment due to personal history of colonic polyps.    08/17/19 Macarthur Critchley (optometry): Patient presented for eye exam. Unable to access notes.  Hospital visits: None in previous 6 months  Objective:  Lab Results  Component Value Date   CREATININE 0.69 08/06/2020   BUN 15 08/06/2020   GFR 82.33 08/06/2020   GFRNONAA 85 12/01/2018   GFRAA 98 12/01/2018   NA 140 08/06/2020   K 4.8 08/06/2020   CALCIUM 9.7 08/06/2020   CO2 30  08/06/2020    Lab Results  Component Value Date/Time   HGBA1C 6.3 (A) 08/06/2020 10:59 AM   HGBA1C 6.1 (A) 02/07/2020 11:39 AM   HGBA1C 6.1 02/07/2020 11:39 AM   HGBA1C 6.1 02/07/2020 11:39 AM   HGBA1C 6.1 02/07/2020 11:39 AM   HGBA1C 6.6 (H) 04/28/2019 11:19 AM   HGBA1C 6.6 (H) 04/30/2017 10:51 AM   GFR 82.33 08/06/2020 11:35 AM   GFR 77.00 04/28/2019 11:19 AM    Last diabetic Eye exam:  Lab Results  Component Value Date/Time   HMDIABEYEEXA No Retinopathy 09/26/2020 08:43 AM    Last diabetic Foot exam: No results found for: HMDIABFOOTEX   Lab Results  Component Value Date   CHOL 152 08/06/2020   HDL 61.50 08/06/2020   LDLCALC 66 08/06/2020   LDLDIRECT 74.0 04/08/2012   TRIG 124.0 08/06/2020   CHOLHDL 2 08/06/2020    Hepatic Function Latest Ref Rng & Units 08/06/2020 04/28/2019 04/29/2018  Total Protein 6.0 - 8.3 g/dL 6.6 6.6 6.6  Albumin 3.5 - 5.2 g/dL 4.7 4.6 4.6  AST 0 - 37 U/L $Remo'16 17 16  'ZcScj$ ALT 0 - 35 U/L $Remo'16 17 18  'CNDnO$ Alk Phosphatase 39 - 117 U/L 41 44 42  Total Bilirubin 0.2 - 1.2 mg/dL 0.9 0.7 0.8  Bilirubin, Direct 0.0 - 0.3 mg/dL 0.1 0.1 0.1    Lab Results  Component Value Date/Time   TSH 1.55 12/01/2018 12:01 PM   TSH 1.29 07/28/2012 02:45 PM    CBC Latest Ref Rng & Units 08/14/2019 07/25/2012 08/25/2011  WBC 4.0 - 10.5 K/uL 7.2  7.4 7.0  Hemoglobin 12.0 - 15.0 g/dL 13.6 14.0 13.8  Hematocrit 36.0 - 46.0 % 40.0 38.1 40.9  Platelets 150.0 - 400.0 K/uL 273.0 273 248.0    Lab Results  Component Value Date/Time   VD25OH 45.51 12/01/2018 12:01 PM    Clinical ASCVD: No  The ASCVD Risk score Mikey Bussing DC Jr., et al., 2013) failed to calculate for the following reasons:   The 2013 ASCVD risk score is only valid for ages 1 to 25    Depression screen PHQ 2/9 12/18/2019 07/28/2019 04/29/2018  Decreased Interest 0 0 0  Down, Depressed, Hopeless 0 0 0  PHQ - 2 Score 0 0 0  Altered sleeping 0 - 0  Tired, decreased energy 0 - 0  Change in appetite 0 - 0  Feeling bad or  failure about yourself  0 - 0  Trouble concentrating 0 - 0  Moving slowly or fidgety/restless 0 - 0  Suicidal thoughts 0 - 0  PHQ-9 Score 0 - 0  Difficult doing work/chores Not difficult at all - Not difficult at all     Social History   Tobacco Use  Smoking Status Never  Smokeless Tobacco Never   BP Readings from Last 3 Encounters:  08/06/20 124/62  06/26/20 124/70  02/07/20 122/76   Pulse Readings from Last 3 Encounters:  08/06/20 66  06/26/20 82  02/07/20 66   Wt Readings from Last 3 Encounters:  08/06/20 123 lb 4.8 oz (55.9 kg)  06/26/20 122 lb 1 oz (55.4 kg)  02/07/20 124 lb 14.4 oz (56.7 kg)    Assessment/Interventions: Review of patient past medical history, allergies, medications, health status, including review of consultants reports, laboratory and other test data, was performed as part of comprehensive evaluation and provision of chronic care management services.   SDOH:  (Social Determinants of Health) assessments and interventions performed: No   CCM Care Plan  No Known Allergies  Medications Reviewed Today     Reviewed by Eulas Post, MD (Physician) on 08/06/20 at 64  Med List Status: <None>   Medication Order Taking? Sig Documenting Provider Last Dose Status Informant  APPLE CIDER VINEGAR PO [id number] Yes Take 900 mg by mouth daily.  [provider] Taking Active   Calcium Citrate-Vitamin D (CALCIUM CITRATE + D3 PO) [id number] Yes $RemoveBefor'1200mg'fePfGnrtpgsG$  calcium 2000 IU Vit. D. Take by mouth daily. [provider] Taking Active            Med Note Thomes Cake Dec 18, 2019  1:22 PM) 1200 MG calcium  2000 IU vitamin D   Chromium-Cinnamon (CINNAMON PLUS CHROMIUM PO) [id number] Yes 1000 mg cinnamon 100 mcg Chromium Take by mouth daily. [provider] Taking Active            Med Note Al Corpus, Dulcy Fanny Dec 18, 2019  1:22 PM) 1000 mg of Cinnamon 100 mcg of chromium  cycloSPORINE (RESTASIS) 0.05 % ophthalmic emulsion  [id number] Yes Place 1 drop into both eyes 2 (two) times daily. [provider] Taking Active   estradiol (CLIMARA - DOSED IN MG/24 HR) 0.075 mg/24hr patch [id number] Yes Place 1 patch (0.075 mg total) onto the skin once a week. Eulas Post, MD Taking Active   glucose blood test strip [id number] Yes Use as instructed tid to check blood sugars. Eulas Post, MD Taking Active   lisinopril (ZESTRIL) 10 MG tablet [id number] Yes TAKE 1 TABLET (10 MG TOTAL) BY MOUTH DAILY. PER  DR Janalyn Shy, MD Taking Active   MAGNESIUM PO [id number] Yes Take 250 mg by mouth daily. [provider] Taking Active   Multiple Vitamin (MULTIVITAMIN PO) [id number] Yes Take 1 tablet by mouth daily. [provider] Taking Active Self  OVER THE COUNTER MEDICATION [id number] Yes Omega - 3 1040 mg take one tab daily [provider] Taking Active   simvastatin (ZOCOR) 20 MG tablet [id number] Yes TAKE 1 TABLET BY MOUTH EVERY DAY Burchette, Alinda Sierras, MD Taking Active   Ultilet Classic Lancets MISC [id number] Yes Testing BS three times daily Eulas Post, MD Taking Active             Patient Active Problem List   Diagnosis Date Noted   Type 2 diabetes mellitus, controlled ([place]) 06/19/2010   Essential hypertension 06/19/2010   Osteoporosis 04/28/2010   PHARYNGITIS 08/01/2008   DYSPNEA 07/30/2008   Hyperlipidemia 05/31/2008   MIGRAINE HEADACHE 09/19/2007   GERD 09/19/2007   DIVERTICULOSIS, COLON 09/19/2007   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   SPONTANEOUS PNEUMOTHORAX 05/16/2007    Immunization History  Administered Date(s) Administered   Fluad Quad(high Dose 65+) 12/15/2018, 12/19/2019   Influenza Split 01/01/2012   Influenza Whole 12/27/2007, 12/06/2010   Influenza, High Dose Seasonal PF 01/11/2013, 12/07/2014, 12/06/2015, 12/02/2016   Influenza,inj,Quad PF,6+ Mos 12/28/2013   Influenza-Unspecified 12/01/2016, 12/28/2016,  12/15/2018   PFIZER Comirnaty(Gray Top)Covid-19 Tri-Sucrose Vaccine 09/27/2020   PFIZER(Purple Top)SARS-COV-2 Vaccination 04/21/2019, 05/11/2019, 01/10/2020   Pneumococcal Conjugate-13 04/06/2006, 10/11/2013   Pneumococcal Polysaccharide-23 04/28/2016   Tdap 01/29/2017   Zoster Recombinat (Shingrix) 10/26/2018, 10/26/2018, 12/30/2018, 12/30/2018   Zoster, Live 03/28/2007   130-150s - 6.3% - checking in the morning -once in a while feels a low blood sugar  BP arm cuff is too big - wrist cuff is on her list to buy -they do make child sizes  BP Readings from Last 3 Encounters:  08/06/20 124/62  06/26/20 124/70  02/07/20 122/76   Estradiol cut the patch in half - only uses once a month -Restasis - call her for renewal   Conditions to be addressed/monitored:  Hypertension, Hyperlipidemia, Diabetes, GERD, Osteoporosis and menopause symptoms and chronic dry eye  Conditions addressed this visit: Diabetes, hypertension  Care Plan : [place]  Updates made by Viona Gilmore, [place] since 11/21/2020 12:00 AM     Problem: Problem: Hypertension, Hyperlipidemia, Diabetes, GERD, Osteoporosis and menopause symptoms and chronic dry eye      Long-Range Goal: Patient-Specific Goal   Start Date: 05/23/2020  Expected End Date: 05/23/2021  Recent Progress: On track  Priority: High  Note:   Current Barriers:  Unable to independently monitor therapeutic efficacy  Pharmacist Clinical Goal(s):  Patient will achieve adherence to monitoring guidelines and medication adherence to achieve therapeutic efficacy maintain control of blood pressure as evidenced by home blood pressure readings  through collaboration with PharmD and provider.   Interventions: 1:1 collaboration with Eulas Post, MD regarding development and update of comprehensive plan of care as evidenced by provider attestation and co-signature Inter-disciplinary care team collaboration (see longitudinal plan of  care) Comprehensive medication review performed; medication list updated in electronic medical record  Hypertension (BP goal <140/90) -Controlled -Current treatment: Lisinopril 10 mg 1 tablet daily  -Medications previously tried: none  -Current home readings: not checking at home; BP cuff is too big -Current dietary habits: did not discuss -Current exercise habits: did not discuss -Denies hypotensive/hypertensive symptoms -Educated on Exercise goal  of 150 minutes per week; Importance of home blood pressure monitoring; Proper BP monitoring technique; -Counseled to monitor BP at home weekly, document, and provide log at future appointments -Recommended to continue current medication Recommended purchasing a children's sized BP cuff  Hyperlipidemia: (LDL goal < 100) -Controlled -Current treatment: Simvastatin 20 mg 1 tablet daily  Fish oil 1000 mg 1 tablet daily -Medications previously tried: none  -Current dietary patterns: did not discuss -Current exercise habits: did not discuss -Educated on Cholesterol goals;  Exercise goal of 150 minutes per week; -Recommended to continue current medication  Diabetes (A1c goal <7%) -Controlled -Current medications: No medications -Medications previously tried: none  -Current home glucose readings fasting glucose: 130-150s (once daily) post prandial glucose: n/a -Denies hypoglycemic/hyperglycemic symptoms -Current meal patterns:  breakfast: n/a  lunch: n/a  dinner: n/a snacks: n/a drinks: n/a -Current exercise: did not discuss -Educated onA1c and blood sugar goals; Prevention and management of hypoglycemic episodes; Benefits of routine self-monitoring of blood sugar; -Counseled to check feet daily and get yearly eye exams -Counseled on diet and exercise extensively Recommended alternating different times of day for checking blood pressure  Osteoporosis (Goal prevent fractures and improve bone density) -Not ideally  controlled -Last DEXA Scan: 09/2018 - Results of report not found - indicates T-score of < -2.5 in unknown location  T-Score femoral neck: n/a  T-Score total hip: n/a  T-Score lumbar spine: n/a  T-Score forearm radius: n/a  10-year probability of major osteoporotic fracture: n/a  10-year probability of hip fracture: n/a -Patient is a candidate for pharmacologic treatment due to T-score < 2.5 in unknown location -Current treatment  Calcium citrate & vit D3 800 mg - 1000 units 1 tablet daily -Medications previously tried: Fosamax (atypical femur fracture after 3 years), patient has had a fracture with a previous medication and does not wish to try any others  -Recommend [phone] units of vitamin D daily. Recommend 1200 mg of calcium daily from dietary and supplemental sources. Recommend weight-bearing and muscle strengthening exercises for building and maintaining bone density. -Recommended to continue current medication Recommended repeat DEXA scan.  Chronic dry eye (Goal: minimize symptoms of dry eye) -Controlled -Current treatment  Restasis 0.05% ophthalmic emulsion 1 drop in both eyes twice daily  -Medications previously tried: none  -Recommended to continue current medication Assessed patient finances. Patient is receiving through [place] and is approved until 04/05/21  GERD (Goal: minimize symptoms of heartburn or acid reflux) -Controlled -Current treatment  No medications -Medications previously tried: n/a  -Counseled on diet and exercise extensively Counseled on continued use of Tums as needed  Menopausal symptoms (Goal: minimize post-menopausal symptoms) -Controlled -Current treatment  Climara patch 0.075 mg/24 hour apply 1/2 patch once a week -Medications previously tried: none -Educated on risks for clotting, breast cancer and heart events with long term use of estrogen therapy  Health Maintenance -Vaccine gaps: none -Current therapy:  Apple cider vinegar 1 tablet  by mouth Cinnamon plus chromium 2000 mg-200 mcg 1 tablet daily Magnesium 250 mg 1 tablet daily  Multivitamin 1 tablet daily -Educated on Cost vs benefit of each product must be carefully weighed by individual consumer -Patient is satisfied with current therapy and denies issues -Recommended to continue current medication  Patient Goals/Self-Care Activities Patient will:  - take medications as prescribed check blood pressure weekly, document, and provide at future appointments  Follow Up Plan: Telephone follow up appointment with care management team member scheduled for: 1 year        Medication  Assistance:  Restasisobtained through [place] medication assistance program.  Enrollment ends 04/05/2021  Compliance/Adherence/Medication fill history: Care Gaps: Foot exam, influenza vaccine   Star-Rating Drugs: Lisinopril $RemoveBeforeDEI'10mg'EHDeizXDKwdfOuLJ$  - [facility] Simvastatin $RemoveBeforeD'20mg'unDyLiJIAWubOw$  - [facility]  Patient's preferred pharmacy is:  [facility], [place] - [address] [place] [address] Phone: [phone] Fax: [phone]  [facility] [place], [address] [place] [place] [address] Phone: [phone] Fax: [phone]  Uses pill box? No - all lined up and uses AM and PM cups Pt endorses 100% compliance   We discussed: Current pharmacy is preferred with insurance plan and patient is satisfied with pharmacy services Patient decided to: Continue current medication management strategy  Care Plan and Follow Up Patient Decision:  Patient agrees to Care Plan and Follow-up.  Plan: Telephone follow up appointment with care management team member scheduled for:  6 months  Jeni Salles, PharmD [place] Pharmacist [place] at [place] [phone]

## 2020-12-02 ENCOUNTER — Ambulatory Visit (INDEPENDENT_AMBULATORY_CARE_PROVIDER_SITE_OTHER): Payer: Medicare Other | Admitting: Family Medicine

## 2020-12-02 ENCOUNTER — Other Ambulatory Visit: Payer: Self-pay

## 2020-12-02 ENCOUNTER — Ambulatory Visit (HOSPITAL_BASED_OUTPATIENT_CLINIC_OR_DEPARTMENT_OTHER)
Admission: RE | Admit: 2020-12-02 | Discharge: 2020-12-02 | Disposition: A | Discharge status: Home / Self Care | Payer: Medicare Other | Source: Ambulatory Visit | Attending: Family Medicine | Admitting: Family Medicine

## 2020-12-02 VITALS — BP 124/70 | HR 70 | Temp 98.2°F | Wt 123.1 lb

## 2020-12-02 DIAGNOSIS — R609 Edema, unspecified: Secondary | ICD-10-CM | POA: Diagnosis not present

## 2020-12-02 DIAGNOSIS — M79671 Pain in right foot: Secondary | ICD-10-CM | POA: Insufficient documentation

## 2020-12-02 DIAGNOSIS — S92324A Nondisplaced fracture of second metatarsal bone, right foot, initial encounter for closed fracture: Secondary | ICD-10-CM | POA: Diagnosis not present

## 2020-12-02 NOTE — Progress Notes (Addendum)
Established Patient Office Visit  Subjective:  Patient ID: [name], female    DOB: 13-Apr-1940  Age: 80 y.o. MRN: [id number]  CC:  Chief Complaint  Patient presents with   Foot Pain    Top of right foot, x 1 week, painful when putting pressure on it, no known injury    HPI [name] presents for right foot pain for 1 week.  No known injury.  She has type 2 diabetes but her diabetes has been extremely well controlled and she does not have any history of significant neuropathy findings.  She has pain when she puts pressure on her foot somewhat poorly localized dorsally and diffusely across the foot.  No erythema.  No warmth.  No ecchymosis.  No significant pain at rest.  Pain is worse barefooted and improved with a shoe.   She does have hx of osteoporosis.  Hx of femur fracture possible related to bisphosphonate therapy.    Past Medical History:  Diagnosis Date   Broken femur ([place])    Cancer ([place]) 2001   skin, nose   Cataract    Diabetes mellitus    Borderline/no meds   Diverticulosis of colon    DNR (do not resuscitate) 2009   GERD (gastroesophageal reflux disease)    Heart murmur    Hyperlipidemia    Hypertension    Kidney stones    1 time   Migraines    during menopause   Osteoporosis 09/2018   T score -2.7 stable from prior DEXA.   Pneumothorax on left     Past Surgical History:  Procedure Laterality Date   ABDOMINAL HYSTERECTOMY  1994   fibroids/ complete   BUNIONECTOMY  2004   left   CARPAL TUNNEL RELEASE  2000   right    CATARACT EXTRACTION     Bil   COLONOSCOPY  10/24/2013   FEMUR FRACTURE SURGERY     LUNG SURGERY  2008   left lung VATS for biopsy and removal of blebs with pleurodesis   OOPHORECTOMY     BSO   POLYPECTOMY     TONSILLECTOMY     as child    Family History  Problem Relation Age of Onset   Cancer Mother        bone   Diabetes Sister    Hyperlipidemia Sister    Ovarian cancer Sister    Pulmonary embolism Father    Cancer  Father        Facial   Cancer Sister        melanoma   Emphysema Maternal Aunt    Breast cancer Neg Hx    Colon cancer Neg Hx    Colon polyps Neg Hx    Esophageal cancer Neg Hx    Rectal cancer Neg Hx    Stomach cancer Neg Hx     Social History   Socioeconomic History   Marital status: Divorced    Spouse name: Not on file   Number of children: Not on file   Years of education: Not on file   Highest education level: Not on file  Occupational History   Not on file  Tobacco Use   Smoking status: Never   Smokeless tobacco: Never  Vaping Use   Vaping Use: Never used  Substance and Sexual Activity   Alcohol use: No    Alcohol/week: 0.0 standard drinks   Drug use: No   Sexual activity: Not Currently    Comment: 1st intercourse 80  yo-Fewer than 5 partners  Other Topics Concern   Not on file  Social History Narrative   Not on file   Social Determinants of Health   Financial Resource Strain: Low Risk    Difficulty of Paying Living Expenses: Not very hard  Food Insecurity: No Food Insecurity   Worried About Running Out of Food in the Last Year: Never true   Ran Out of Food in the Last Year: Never true  Transportation Needs: No Transportation Needs   Lack of Transportation (Medical): No   Lack of Transportation (Non-Medical): No  Physical Activity: Inactive   Days of Exercise per Week: 0 days   Minutes of Exercise per Session: 0 min  Stress: No Stress Concern Present   Feeling of Stress : Not at all  Social Connections: Moderately Isolated   Frequency of Communication with Friends and Family: More than three times a week   Frequency of Social Gatherings with Friends and Family: Once a week   Attends Religious Services: More than 4 times per year   Active Member of [employer] or Organizations: No   Attends Archivist Meetings: Never   Marital Status: Divorced  Human resources officer Violence: Not At Risk   Fear of Current or Ex-Partner: No   Emotionally Abused: No    Physically Abused: No   Sexually Abused: No    Outpatient Medications Prior to Visit  Medication Sig Dispense Refill   APPLE CIDER VINEGAR PO Take 900 mg by mouth daily.      Calcium Citrate-Vitamin D (CALCIUM CITRATE + D3 PO) '1200mg'$  calcium 2000 IU Vit. D. Take by mouth daily.     Chromium-Cinnamon (CINNAMON PLUS CHROMIUM PO) 1000 mg cinnamon 100 mcg Chromium Take by mouth daily.     cycloSPORINE (RESTASIS) 0.05 % ophthalmic emulsion Place 1 drop into both eyes 2 (two) times daily.     estradiol (CLIMARA - DOSED IN MG/24 HR) 0.075 mg/24hr patch Place 1 patch (0.075 mg total) onto the skin once a week. 4 patch 11   glucose blood test strip Use as instructed tid to check blood sugars. 300 each 3   lisinopril (ZESTRIL) 10 MG tablet TAKE 1 TABLET (10 MG TOTAL) BY MOUTH DAILY. PER DR Hara Milholland 90 tablet 3   MAGNESIUM PO Take 250 mg by mouth daily.     Multiple Vitamin (MULTIVITAMIN PO) Take 1 tablet by mouth daily.     OVER THE COUNTER MEDICATION Omega - 3 1040 mg take one tab daily     simvastatin (ZOCOR) 20 MG tablet TAKE 1 TABLET BY MOUTH EVERY DAY 90 tablet 3   Ultilet Classic Lancets MISC Testing BS three times daily 300 each 3   No facility-administered medications prior to visit.    No Known Allergies  ROS Review of Systems  Constitutional:  Negative for chills and fever.     Objective:    Physical Exam Vitals reviewed.  Constitutional:      Appearance: Normal appearance.  Cardiovascular:     Rate and Rhythm: Normal rate.  Musculoskeletal:     Comments: Right foot reveals some mild subtle edema compared to the left but no erythema.  No warmth.  No ecchymosis.  Does not have any localized tenderness to palpation.  No ankle tenderness.  No ankle effusion.  Normal sensory function to touch.  Good distal pulses.  Neurological:     Mental Status: She is alert.    BP 124/70 (BP Location: Left Arm, Patient Position: Sitting, Cuff Size:  Normal)   Pulse 70   Temp 98.2 F  (36.8 C) (Oral)   Wt 123 lb 1.6 oz (55.8 kg)   SpO2 99%   BMI 24.04 kg/m  Wt Readings from Last 3 Encounters:  12/02/20 123 lb 1.6 oz (55.8 kg)  08/06/20 123 lb 4.8 oz (55.9 kg)  06/26/20 122 lb 1 oz (55.4 kg)     Health Maintenance Due  Topic Date Due   FOOT EXAM  04/27/2020   INFLUENZA VACCINE  11/04/2020    There are no preventive care reminders to display for this patient.  Lab Results  Component Value Date   TSH 1.55 12/01/2018   Lab Results  Component Value Date   WBC 7.2 08/14/2019   HGB 13.6 08/14/2019   HCT 40.0 08/14/2019   MCV 97.3 08/14/2019   PLT 273.0 08/14/2019   Lab Results  Component Value Date   NA 140 08/06/2020   K 4.8 08/06/2020   CO2 30 08/06/2020   GLUCOSE 108 (H) 08/06/2020   BUN 15 08/06/2020   CREATININE 0.69 08/06/2020   BILITOT 0.9 08/06/2020   ALKPHOS 41 08/06/2020   AST 16 08/06/2020   ALT 16 08/06/2020   PROT 6.6 08/06/2020   ALBUMIN 4.7 08/06/2020   CALCIUM 9.7 08/06/2020   GFR 82.33 08/06/2020   Lab Results  Component Value Date   CHOL 152 08/06/2020   Lab Results  Component Value Date   HDL 61.50 08/06/2020   Lab Results  Component Value Date   LDLCALC 66 08/06/2020   Lab Results  Component Value Date   TRIG 124.0 08/06/2020   Lab Results  Component Value Date   CHOLHDL 2 08/06/2020   Lab Results  Component Value Date   HGBA1C 6.3 (A) 08/06/2020      Assessment & Plan:   Problem List Items Addressed This Visit   None Visit Diagnoses     Right foot pain    -  Primary   Relevant Orders   DG Foot Complete Right     Patient presents with 1 week history of diffuse right foot pain without injury.  She does have type 2 diabetes but history of excellent control and low clinical suspicion for Charcot related changes.  She has not been walking as much recently and stress fracture would seem unlikely especially without any localized tenderness.  -Start with x-rays. -If negative, she will try some  over-the-counter diclofenac gel along with intermittent icing 2-3 times daily and good comfortable shoe wear  No orders of the defined types were placed in this encounter.   Follow-up: No follow-ups on file.    [name], MD

## 2020-12-02 NOTE — Patient Instructions (Signed)
If X-rays negative, focus on elevation, icing 15-20 minutes 2-3 times daily  Could also consider trial of OTC topical Diclofenac gel.

## 2020-12-03 ENCOUNTER — Telehealth: Payer: Self-pay | Admitting: Family Medicine

## 2020-12-03 NOTE — Telephone Encounter (Signed)
Please advise 

## 2020-12-03 NOTE — Addendum Note (Signed)
Addended by: Eulas Post on: 12/03/2020 08:41 PM   Modules accepted: Orders

## 2020-12-03 NOTE — Telephone Encounter (Signed)
Diane from [facility] called about a call report for Patient. Needs confirmation you've received report through chart, and if not, confirmation to fax it over.    Good callback number is [phone]    Please Advise

## 2020-12-03 NOTE — Telephone Encounter (Signed)
Patient has been notified of results.  Probable nondisplaced second metatarsal fracture.  Setting up referral to sports medicine.

## 2020-12-04 ENCOUNTER — Other Ambulatory Visit: Payer: Self-pay

## 2020-12-04 ENCOUNTER — Ambulatory Visit (INDEPENDENT_AMBULATORY_CARE_PROVIDER_SITE_OTHER): Payer: Medicare Other | Admitting: Family Medicine

## 2020-12-04 VITALS — BP 118/76 | HR 73 | Ht 60.0 in | Wt 122.8 lb

## 2020-12-04 DIAGNOSIS — S92324A Nondisplaced fracture of second metatarsal bone, right foot, initial encounter for closed fracture: Secondary | ICD-10-CM

## 2020-12-04 NOTE — Patient Instructions (Signed)
Thank you for coming in today.   Please go to [facility] supply to get the post op shoe we talked about today. You may also be able to get it from [employer].    Use the arch support sticker with the shoe.   Recheck in about 2 weeks.

## 2020-12-04 NOTE — Progress Notes (Signed)
   I, Peterson Lombard, LAT, ATC acting as a scribe for Lynne Leader, MD.  Subjective:    CC: R foot pain  HPI: Pt is an 80 y/o female presenting w/ R foot pain x approximately one week w/ no known MOI.  She locates her pain to her R dorsal foot.  She was seen by her PCP on 12/02/20 and had a R foot XR that revealed an acute 2nd MT fx.  She has a hx of osteoporosis.  Today, pt reports R foot is still pretty painful.   R foot swelling: yes Aggravating factors: R LE weight-bearing  Treatments tried: ice,   Diagnostic imaging: R foot XR- 12/02/20  Pertinent review of Systems: No fevers or chills  Relevant historical information: Osteoporosis with history of spontaneous fracture of femur thought to be due to bisphosphonates.   Objective:    Vitals:   12/04/20 1358  BP: 118/76  Pulse: 73  SpO2: 95%   General: Well Developed, well nourished, and in no acute distress.   MSK: Right foot mildly swollen.  Mildly tender palpation dorsal midfoot.  Nontender Lisfranc area.  Mild antalgic gait.  Lab and Radiology Results No results found for this or any previous visit (from the past 72 hour(s)). DG Foot Complete Right  Result Date: 12/03/2020 CLINICAL DATA:  Right foot pain and edema. EXAM: RIGHT FOOT COMPLETE - 3+ VIEW COMPARISON:  None. FINDINGS: There is an acute fracture through the base of the second metacarpal bone. No additional fractures or dislocation identified. Soft tissues are unremarkable. IMPRESSION: Acute fracture involves the base of the second metacarpal bone. These results were called by telephone at the time of interpretation on 12/03/2020 at 2:12 pm to provider [facility] , who verbally acknowledged these results. Electronically Signed   By: Kerby Moors M.D.   On: 12/03/2020 14:13    I, Lynne Leader, personally (independently) visualized and performed the interpretation of the images attached in this note.   Impression and Recommendations:    Assessment and  Plan: 80 y.o. female with right foot proximal second metatarsal fracture.  I believe this is more like a stress fracture than a true classic proximal metatarsal fracture.  She does not have an injury to explain it and has osteoporosis and I think this is a converted stress fracture.  We will treat as such with postop shoe with good arch support.  Recommend limited weightbearing as needed or as tolerated with a walker.  Continue good dose of vitamin D and calcium which is already taking.  Recheck in 2 weeks.  Repeat x-ray at that time..  Discussed warning signs or symptoms. Please see discharge instructions. Patient expresses understanding.   The above documentation has been reviewed and is accurate and complete Lynne Leader, M.D.

## 2020-12-05 ENCOUNTER — Telehealth: Payer: Self-pay | Admitting: Family Medicine

## 2020-12-05 NOTE — Telephone Encounter (Signed)
Patient has been invited to a family gathering on Sunday that will require her to go up ( and down) three flights of stairs.  Pt wanting Dr. Clovis Riley opinion on this decision.

## 2020-12-05 NOTE — Telephone Encounter (Signed)
Dr. Clovis Riley response read to patient, pt understood.

## 2020-12-05 NOTE — Telephone Encounter (Signed)
I think if it does not hurt too much she probably could do that if you want to. If you are hurting a lot or you do not want to go then you do not have to go.    Be careful about your risk of falls wearing the postop shoe to make sure someone helps you a little bit while you go up and down the stairs.  In general I think it is probably okay.

## 2020-12-06 ENCOUNTER — Encounter: Payer: Self-pay | Admitting: Adult Health

## 2020-12-06 ENCOUNTER — Ambulatory Visit (INDEPENDENT_AMBULATORY_CARE_PROVIDER_SITE_OTHER): Payer: Medicare Other | Admitting: Adult Health

## 2020-12-06 ENCOUNTER — Other Ambulatory Visit: Payer: Self-pay

## 2020-12-06 ENCOUNTER — Ambulatory Visit (HOSPITAL_BASED_OUTPATIENT_CLINIC_OR_DEPARTMENT_OTHER)
Admission: RE | Admit: 2020-12-06 | Discharge: 2020-12-06 | Disposition: A | Discharge status: Home / Self Care | Payer: Medicare Other | Source: Ambulatory Visit | Attending: Adult Health | Admitting: Adult Health

## 2020-12-06 VITALS — BP 118/62 | HR 71 | Temp 98.6°F | Ht 60.0 in | Wt 123.8 lb

## 2020-12-06 DIAGNOSIS — M25552 Pain in left hip: Secondary | ICD-10-CM | POA: Insufficient documentation

## 2020-12-06 NOTE — Progress Notes (Signed)
Subjective:    Patient ID: [name], female    DOB: 05/12/1940, 80 y.o.   MRN: [id number]  HPI  80 year old female who  has a past medical history of Broken femur ([place]), Cancer ([place]) (2001), Cataract, Diabetes mellitus, Diverticulosis of colon, DNR (do not resuscitate) (2009), GERD (gastroesophageal reflux disease), Heart murmur, Hyperlipidemia, Hypertension, Kidney stones, Migraines, Osteoporosis (09/2018), and Pneumothorax on left.  She is a patient of Dr. Elease Hashimoto who I am seeing today for the complaint of left leg pain x 1 month.  She has a history of a closed displaced subtrochanteric fracture of the left femur that was surgically repaired in 2019.  About a month ago she started to experience 1 to 2 weeks of intermittent sharp pain in her left hip that eventually resolved but now experiencing a "soreness" in her left hip intermittently.  Has soreness when sitting, standing, walking, and resting.  Nothing exacerbates or makes it worse.  She would like to have an x-ray of the left hip just to make sure her hardware is all right.  Review of Systems See HPI   Past Medical History:  Diagnosis Date   Broken femur ([place])    Cancer ([place]) 2001   skin, nose   Cataract    Diabetes mellitus    Borderline/no meds   Diverticulosis of colon    DNR (do not resuscitate) 2009   GERD (gastroesophageal reflux disease)    Heart murmur    Hyperlipidemia    Hypertension    Kidney stones    1 time   Migraines    during menopause   Osteoporosis 09/2018   T score -2.7 stable from prior DEXA.   Pneumothorax on left     Social History   Socioeconomic History   Marital status: Divorced    Spouse name: Not on file   Number of children: Not on file   Years of education: Not on file   Highest education level: Not on file  Occupational History   Not on file  Tobacco Use   Smoking status: Never   Smokeless tobacco: Never  Vaping Use   Vaping Use: Never used  Substance and Sexual Activity    Alcohol use: No    Alcohol/week: 0.0 standard drinks   Drug use: No   Sexual activity: Not Currently    Comment: 1st intercourse 80 yo-Fewer than 5 partners  Other Topics Concern   Not on file  Social History Narrative   Not on file   Social Determinants of Health   Financial Resource Strain: Low Risk    Difficulty of Paying Living Expenses: Not very hard  Food Insecurity: No Food Insecurity   Worried About Charity fundraiser in the Last Year: Never true   [place] in the Last Year: Never true  Transportation Needs: No Transportation Needs   Lack of Transportation (Medical): No   Lack of Transportation (Non-Medical): No  Physical Activity: Inactive   Days of Exercise per Week: 0 days   Minutes of Exercise per Session: 0 min  Stress: No Stress Concern Present   Feeling of Stress : Not at all  Social Connections: Moderately Isolated   Frequency of Communication with Friends and Family: More than three times a week   Frequency of Social Gatherings with Friends and Family: Once a week   Attends Religious Services: More than 4 times per year   Active Member of Clubs or Organizations: No   Attends  Club or Organization Meetings: Never   Marital Status: Divorced  Human resources officer Violence: Not At Risk   Fear of Current or Ex-Partner: No   Emotionally Abused: No   Physically Abused: No   Sexually Abused: No    Past Surgical History:  Procedure Laterality Date   ABDOMINAL HYSTERECTOMY  1994   fibroids/ complete   BUNIONECTOMY  2004   left   CARPAL TUNNEL RELEASE  2000   right    CATARACT EXTRACTION     [facility]   COLONOSCOPY  10/24/2013   FEMUR FRACTURE SURGERY     LUNG SURGERY  2008   left lung VATS for biopsy and removal of blebs with pleurodesis   OOPHORECTOMY     BSO   POLYPECTOMY     TONSILLECTOMY     as child    Family History  Problem Relation Age of Onset   Cancer Mother        bone   Diabetes Sister    Hyperlipidemia Sister    Ovarian cancer  Sister    Pulmonary embolism Father    Cancer Father        Facial   Cancer Sister        melanoma   Emphysema Maternal Aunt    Breast cancer Neg Hx    Colon cancer Neg Hx    Colon polyps Neg Hx    Esophageal cancer Neg Hx    Rectal cancer Neg Hx    Stomach cancer Neg Hx     No Known Allergies  Current Outpatient Medications on File Prior to Visit  Medication Sig Dispense Refill   APPLE CIDER VINEGAR PO Take 900 mg by mouth daily.      Calcium Citrate-Vitamin D (CALCIUM CITRATE + D3 PO) '1200mg'$  calcium 2000 IU Vit. D. Take by mouth daily.     Chromium-Cinnamon (CINNAMON PLUS CHROMIUM PO) 1000 mg cinnamon 100 mcg Chromium Take by mouth daily.     cycloSPORINE (RESTASIS) 0.05 % ophthalmic emulsion Place 1 drop into both eyes 2 (two) times daily.     estradiol (CLIMARA - DOSED IN MG/24 HR) 0.075 mg/24hr patch Place 1 patch (0.075 mg total) onto the skin once a week. 4 patch 11   glucose blood test strip Use as instructed tid to check blood sugars. 300 each 3   lisinopril (ZESTRIL) 10 MG tablet TAKE 1 TABLET (10 MG TOTAL) BY MOUTH DAILY. PER DR BURCHETTE 90 tablet 3   MAGNESIUM PO Take 250 mg by mouth daily.     Multiple Vitamin (MULTIVITAMIN PO) Take 1 tablet by mouth daily.     OVER THE COUNTER MEDICATION Omega - 3 1040 mg take one tab daily     simvastatin (ZOCOR) 20 MG tablet TAKE 1 TABLET BY MOUTH EVERY DAY 90 tablet 3   Ultilet Classic Lancets MISC Testing BS three times daily 300 each 3   No current facility-administered medications on file prior to visit.    BP 118/62 (BP Location: Left Arm, Patient Position: Sitting, Cuff Size: Normal)   Pulse 71   Temp 98.6 F (37 C) (Oral)   Ht 5' (1.524 m)   Wt 123 lb 12.8 oz (56.2 kg)   SpO2 97%   BMI 24.18 kg/m       Objective:   Physical Exam Vitals and nursing note reviewed.  Constitutional:      Appearance: Normal appearance.  Musculoskeletal:        General: Normal range of motion.  Comments: He had no loss of  range of motion of the left hip.  Had some very minor discomfort with internal and external rotation.  Skin:    General: Skin is warm and dry.     Capillary Refill: Capillary refill takes less than 2 seconds.  Neurological:     General: No focal deficit present.     Mental Status: She is alert and oriented to person, place, and time.     Motor: No weakness.     Gait: Gait normal.  Psychiatric:        Mood and Affect: Mood normal.        Behavior: Behavior normal.        Thought Content: Thought content normal.        Judgment: Judgment normal.       Assessment & Plan:  1. Left hip pain -Likely more soft tissue discomfort.  Will order x-ray of left hip per patient request. - DG Hip Unilat W OR W/O Pelvis 2-3 Views Left; Future -Encouraged warm compress and stretching exercises  Dorothyann Peng, NP

## 2020-12-17 NOTE — Progress Notes (Signed)
   I, Peterson Lombard, LAT, ATC acting as a scribe for Lynne Leader, MD.  [name] is a 80 y.o. female who presents to [place] [facility] today for f/u R 2nd MT stress fx. Pt has a hx of osteoporosis. Pt was last seen by Dr. Georgina Snell on 12/04/20 and was advised to wear a post-op shoes w/ good arch support, limited weightbearing, and cont vitamin D and calcium. Today, pt reports some slight pain after wearing the post-op shoe for awhile. Pt notes she is scheduled for a Dexa scan tomorrow.   She is reluctant to consider osteoporosis medication as she had a stress fracture attributable to Reclast in the past.  Dx imaging: 12/02/20 R foot XR  Pertinent review of systems: No fevers or chills  Relevant historical information: Hypertension.  Osteoporosis.  Diabetes.   Exam:  BP 122/76   Pulse 68   Ht 5' (1.524 m)   Wt 124 lb 6.4 oz (56.4 kg)   SpO2 97%   BMI 24.30 kg/m  General: Well Developed, well nourished, and in no acute distress.   MSK: Right foot normal-appearing not particularly tender to palpation.    Lab and Radiology Results  X-ray images right foot obtained today personally and independently interpreted Transverse fracture second metatarsal proximal.  No callus formation yet.  No displacement. Await formal radiology review    Assessment and Plan: 80 y.o. female with second metatarsal fracture about 35 weeks old now.  Patient did not have an obvious high impact injury to cause this fracture.  I believe it is more of a stress fracture type.  Treating with postop shoe with good arch support which seems to be working well.  Clinically she is improving.  Plan to repeat x-ray in about 2 weeks.  We will follow-up DEXA scan results at the follow-up visit in about 2 weeks.  She may be a good candidate for a medication like Evenity for osteoporosis management after her fracture heals but she is very reluctant to consider medications for osteoporosis.   PDMP not  reviewed this encounter. Orders Placed This Encounter  Procedures   DG Foot Complete Right    Standing Status:   Future    Number of Occurrences:   1    Standing Expiration Date:   12/17/2021    Order Specific Question:   Reason for Exam (SYMPTOM  OR DIAGNOSIS REQUIRED)    Answer:   right foot pain    Order Specific Question:   Preferred imaging location?    Answer:   [facility]   No orders of the defined types were placed in this encounter.    Discussed warning signs or symptoms. Please see discharge instructions. Patient expresses understanding.   The above documentation has been reviewed and is accurate and complete Lynne Leader, M.D.

## 2020-12-18 ENCOUNTER — Other Ambulatory Visit: Payer: Self-pay

## 2020-12-18 ENCOUNTER — Ambulatory Visit (INDEPENDENT_AMBULATORY_CARE_PROVIDER_SITE_OTHER): Payer: Medicare Other | Admitting: Family Medicine

## 2020-12-18 ENCOUNTER — Ambulatory Visit (INDEPENDENT_AMBULATORY_CARE_PROVIDER_SITE_OTHER): Payer: Medicare Other

## 2020-12-18 ENCOUNTER — Ambulatory Visit: Payer: Medicare Other

## 2020-12-18 VITALS — BP 122/76 | HR 68 | Ht 60.0 in | Wt 124.4 lb

## 2020-12-18 DIAGNOSIS — S92324A Nondisplaced fracture of second metatarsal bone, right foot, initial encounter for closed fracture: Secondary | ICD-10-CM

## 2020-12-18 DIAGNOSIS — S92324D Nondisplaced fracture of second metatarsal bone, right foot, subsequent encounter for fracture with routine healing: Secondary | ICD-10-CM | POA: Diagnosis not present

## 2020-12-18 DIAGNOSIS — M8080XD Other osteoporosis with current pathological fracture, unspecified site, subsequent encounter for fracture with routine healing: Secondary | ICD-10-CM | POA: Diagnosis not present

## 2020-12-18 DIAGNOSIS — Z23 Encounter for immunization: Secondary | ICD-10-CM

## 2020-12-18 NOTE — Patient Instructions (Addendum)
Thank you for coming in today.   Recheck in 2 weeks. We will get xray then.   Cotinue the post op shoe.   Let me know if you have a problem.   Research Evenity.  Romosozumab (Evenity) injection What is this medication? ROMOSOZUMAB (roe moe SOZ ue mab) increases bone formation. It is used to treat osteoporosis in women. This medicine may be used for other purposes; ask your health care provider or pharmacist if you have questions. COMMON BRAND NAME(S): EVENITY What should I tell my care team before I take this medication? They need to know if you have any of these conditions: dental disease or wear dentures heart disease history of stroke kidney disease low levels of calcium in the blood an unusual or allergic reaction to romosozumab, other medicines, foods, dyes or preservatives pregnant or trying to get pregnant breast-feeding How should I use this medication? This medicine is for injection under the skin. It is given by a healthcare professional in a hospital or clinic setting. Talk to your pediatrician about the use of this medicine in children. Special care may be needed. Overdosage: If you think you have taken too much of this medicine contact a poison control center or emergency room at once. NOTE: This medicine is only for you. Do not share this medicine with others. What if I miss a dose? Keep appointments for follow-up doses. It is important not to miss your dose. Call your doctor or healthcare professional if you are unable to keep an appointment. What may interact with this medication? Interactions are not expected. This list may not describe all possible interactions. Give your health care provider a list of all the medicines, herbs, non-prescription drugs, or dietary supplements you use. Also tell them if you smoke, drink alcohol, or use illegal drugs. Some items may interact with your medicine. What should I watch for while using this medication? Your condition will  be monitored carefully while you are receiving this medicine. You may need blood work done while you are taking this medicine. Some people who take this medicine have severe bone, joint, or muscle pain. This medicine may also increase your risk for jaw problems or a broken thigh bone. Tell your healthcare professional right away if you have severe pain in your jaw, bones, joints, or muscles. Tell your healthcare professional if you have any pain that does not go away or that gets worse. You should make sure you get enough calcium and vitamin D while you are taking this medicine. Discuss the foods you eat and the vitamins you take with your healthcare professional. Tell your dentist and dental surgeon that you are taking this medicine. You should not have major dental surgery while on this medicine. See your dentist to have a dental exam and fix any dental problems before starting this medicine. Take good care of your teeth while on this medicine. Make sure you see your dentist for regular follow-up appointments. What side effects may I notice from receiving this medication? Side effects that you should report to your doctor or health care professional as soon as possible: allergic reactions like skin rash, itching or hives, swelling of the face, lips, or tongue bone pain chest pain or chest tightness jaw pain, especially after dental work signs and symptoms of a stroke like changes in vision; confusion; trouble speaking or understanding; severe headaches; sudden numbness or weakness of the face, arm or leg; trouble walking; dizziness; loss of balance or coordination signs and symptoms of  low calcium like fast heartbeat; muscle cramps; muscle pain; pain, tingling, numbness in the hands or feet; seizures Side effects that usually do not require medical attention (report these to your doctor or health care professional if they continue or are bothersome): headache joint pain pain, redness, or irritation  at site where injected pain, tingling, numbness in the hands or feet swelling of ankles, feet, hands trouble sleeping This list may not describe all possible side effects. Call your doctor for medical advice about side effects. You may report side effects to FDA at 1-800-FDA-1088. Where should I keep my medication? This medicine is given in a hospital or clinic and will not be stored at home. NOTE: This sheet is a summary. It may not cover all possible information. If you have questions about this medicine, talk to your doctor, pharmacist, or health care provider.  2022 Elsevier/Gold Standard (2017-07-15 01:15:32)

## 2020-12-19 ENCOUNTER — Ambulatory Visit (HOSPITAL_BASED_OUTPATIENT_CLINIC_OR_DEPARTMENT_OTHER)
Admission: RE | Admit: 2020-12-19 | Discharge: 2020-12-19 | Disposition: A | Discharge status: Home / Self Care | Payer: Medicare Other | Source: Ambulatory Visit | Attending: Family Medicine | Admitting: Family Medicine

## 2020-12-19 DIAGNOSIS — Z780 Asymptomatic menopausal state: Secondary | ICD-10-CM | POA: Diagnosis not present

## 2020-12-19 DIAGNOSIS — M810 Age-related osteoporosis without current pathological fracture: Secondary | ICD-10-CM | POA: Insufficient documentation

## 2020-12-19 DIAGNOSIS — M8588 Other specified disorders of bone density and structure, other site: Secondary | ICD-10-CM | POA: Diagnosis not present

## 2020-12-19 NOTE — Progress Notes (Signed)
X-ray right foot shows stable appearing fracture

## 2020-12-30 ENCOUNTER — Telehealth: Payer: Self-pay | Admitting: Family Medicine

## 2020-12-30 NOTE — Telephone Encounter (Signed)
PT called to advise that she would like to get a copy of her bone density test results to pick up and would also like a copy sent over to Dr.Evan Georgina Snell at [facility].

## 2020-12-31 ENCOUNTER — Ambulatory Visit: Payer: Medicare Other

## 2020-12-31 ENCOUNTER — Ambulatory Visit (INDEPENDENT_AMBULATORY_CARE_PROVIDER_SITE_OTHER): Payer: Medicare Other

## 2020-12-31 DIAGNOSIS — Z0000 Encounter for general adult medical examination without abnormal findings: Secondary | ICD-10-CM | POA: Diagnosis not present

## 2020-12-31 NOTE — Telephone Encounter (Signed)
Spoke with the patient. She is aware that since that is another [place] office they will have access to her chart, she is also aware I have printed a copy and placed it up front for her to pick up.

## 2020-12-31 NOTE — Progress Notes (Signed)
Subjective:   [name] is a 80 y.o. female who presents for Medicare Annual (Subsequent) preventive examination.   I connected with [name] today by telephone and verified that I am speaking with the correct person using two identifiers. Location patient: home Location provider: work Persons participating in the virtual visit: patient, provider.   I discussed the limitations, risks, security and privacy concerns of performing an evaluation and management service by telephone and the availability of in person appointments. I also discussed with the patient that there may be a patient responsible charge related to this service. The patient expressed understanding and verbally consented to this telephonic visit.    Interactive audio and video telecommunications were attempted between this provider and patient, however failed, due to patient having technical difficulties OR patient did not have access to video capability.  We continued and completed visit with audio only.    Review of Systems    N/a       Objective:    There were no vitals filed for this visit. There is no height or weight on file to calculate BMI.  Advanced Directives 12/18/2019 12/02/2016  Does Patient Have a Medical Advance Directive? Yes Yes  Type of Paramedic of [place];Living will -  Does patient want to make changes to medical advance directive? No - Patient declined -  Copy of [place] in Chart? Yes - validated most recent copy scanned in chart (See row information) -    Current Medications (verified) Outpatient Encounter Medications as of 12/31/2020  Medication Sig   APPLE CIDER VINEGAR PO Take 900 mg by mouth daily.    Calcium Citrate-Vitamin D (CALCIUM CITRATE + D3 PO) 1200mg  calcium 2000 IU Vit. D. Take by mouth daily.   Chromium-Cinnamon (CINNAMON PLUS CHROMIUM PO) 1000 mg cinnamon 100 mcg Chromium Take by mouth daily.   cycloSPORINE (RESTASIS) 0.05  % ophthalmic emulsion Place 1 drop into both eyes 2 (two) times daily.   estradiol (CLIMARA - DOSED IN MG/24 HR) 0.075 mg/24hr patch Place 1 patch (0.075 mg total) onto the skin once a week.   glucose blood test strip Use as instructed tid to check blood sugars.   lisinopril (ZESTRIL) 10 MG tablet TAKE 1 TABLET (10 MG TOTAL) BY MOUTH DAILY. PER DR BURCHETTE   MAGNESIUM PO Take 250 mg by mouth daily.   Multiple Vitamin (MULTIVITAMIN PO) Take 1 tablet by mouth daily.   OVER THE COUNTER MEDICATION Omega - 3 1040 mg take one tab daily   simvastatin (ZOCOR) 20 MG tablet TAKE 1 TABLET BY MOUTH EVERY DAY   Ultilet Classic Lancets MISC Testing BS three times daily   No facility-administered encounter medications on file as of 12/31/2020.    Allergies (verified) Patient has no known allergies.   History: Past Medical History:  Diagnosis Date   Broken femur ([place])    Cancer ([place]) 2001   skin, nose   Cataract    Diabetes mellitus    Borderline/no meds   Diverticulosis of colon    DNR (do not resuscitate) 2009   GERD (gastroesophageal reflux disease)    Heart murmur    Hyperlipidemia    Hypertension    Kidney stones    1 time   Migraines    during menopause   Osteoporosis 09/2018   T score -2.7 stable from prior DEXA.   Pneumothorax on left    Past Surgical History:  Procedure Laterality Date   ABDOMINAL HYSTERECTOMY  1994   fibroids/ complete   BUNIONECTOMY  2004   left   CARPAL TUNNEL RELEASE  2000   right    CATARACT EXTRACTION     Bil   COLONOSCOPY  10/24/2013   FEMUR FRACTURE SURGERY     LUNG SURGERY  2008   left lung VATS for biopsy and removal of blebs with pleurodesis   OOPHORECTOMY     BSO   POLYPECTOMY     TONSILLECTOMY     as child   Family History  Problem Relation Age of Onset   Cancer Mother        bone   Diabetes Sister    Hyperlipidemia Sister    Ovarian cancer Sister    Pulmonary embolism Father    Cancer Father        Facial   Cancer Sister         melanoma   Emphysema Maternal Aunt    Breast cancer Neg Hx    Colon cancer Neg Hx    Colon polyps Neg Hx    Esophageal cancer Neg Hx    Rectal cancer Neg Hx    Stomach cancer Neg Hx    Social History   Socioeconomic History   Marital status: Divorced    Spouse name: Not on file   Number of children: Not on file   Years of education: Not on file   Highest education level: Not on file  Occupational History   Not on file  Tobacco Use   Smoking status: Never   Smokeless tobacco: Never  Vaping Use   Vaping Use: Never used  Substance and Sexual Activity   Alcohol use: No    Alcohol/week: 0.0 standard drinks   Drug use: No   Sexual activity: Not Currently    Comment: 1st intercourse 80 yo-Fewer than 5 partners  Other Topics Concern   Not on file  Social History Narrative   Not on file   Social Determinants of Health   Financial Resource Strain: Low Risk    Difficulty of Paying Living Expenses: Not very hard  Food Insecurity: Not on file  Transportation Needs: No Transportation Needs   Lack of Transportation (Medical): No   Lack of Transportation (Non-Medical): No  Physical Activity: Not on file  Stress: Not on file  Social Connections: Not on file    Tobacco Counseling Counseling given: Not Answered   Clinical Intake:                 Diabetic?no Nutrition Risk Assessment:  Has the patient had any N/V/D within the last 2 months?  No  Does the patient have any non-healing wounds?  No  Has the patient had any unintentional weight loss or weight gain?  No   Diabetes:  Is the patient diabetic?  Yes  If diabetic, was a CBG obtained today?  No  Did the patient bring in their glucometer from home?  No  How often do you monitor your CBG's? Daily .   Financial Strains and Diabetes Management:  Are you having any financial strains with the device, your supplies or your medication? No .  Does the patient want to be seen by Chronic Care Management for  management of their diabetes?  No  Would the patient like to be referred to a Nutritionist or for Diabetic Management?  No   Diabetic Exams:  Diabetic Eye Exam: Completed 09/26/2020 Diabetic Foot Exam: Overdue, Pt has been advised about the importance in completing this  exam. Pt is scheduled for diabetic foot exam on next office visit .          Activities of Daily Living No flowsheet data found.  Patient Care Team: Eulas Post, MD as PCP - General (Family Medicine) Viona Gilmore, [facility] as Pharmacist (Pharmacist)  Indicate any recent Medical Services you may have received from other than [facility] providers in the past year (date may be approximate).     Assessment:   This is a routine wellness examination for Rei.  Hearing/Vision screen No results found.  Dietary issues and exercise activities discussed:     Goals Addressed   None    Depression Screen PHQ 2/9 Scores 12/18/2019 07/28/2019 04/29/2018 12/02/2016 06/19/2016 10/23/2015 04/24/2015  PHQ - 2 Score 0 0 0 0 0 0 0  PHQ- 9 Score 0 - 0 - - - -    Fall Risk Fall Risk  12/18/2019 07/28/2019 04/29/2018 12/02/2016 06/19/2016  Falls in the past year? 0 0 1 No No  Number falls in past yr: 0 0 1 - -  Injury with Fall? 0 0 1 - -  Risk for fall due to : Medication side effect - - - -  Follow up Falls evaluation completed;Falls prevention discussed Falls evaluation completed - - -    FALL RISK PREVENTION PERTAINING TO THE HOME:  Any stairs in or around the home? No  If so, are there any without handrails? Yes  Home free of loose throw rugs in walkways, pet beds, electrical cords, etc? Yes  Adequate lighting in your home to reduce risk of falls? Yes   ASSISTIVE DEVICES UTILIZED TO PREVENT FALLS:  Life alert? No  Use of a cane, walker or w/c? No  Grab bars in the bathroom? Yes  Shower chair or bench in shower? Yes  Elevated toilet seat or a handicapped toilet? No    Cognitive Function:    Normal cognitive status  assessed by direct observation by this Nurse Health Advisor. No abnormalities found.      Immunizations Immunization History  Administered Date(s) Administered   Fluad Quad(high Dose 65+) 12/15/2018, 12/19/2019, 12/18/2020   Influenza Split 01/01/2012   Influenza Whole 12/27/2007, 12/06/2010   Influenza, High Dose Seasonal PF 01/11/2013, 12/07/2014, 12/06/2015, 12/02/2016   Influenza,inj,Quad PF,6+ Mos 12/28/2013   Influenza-Unspecified 12/01/2016, 12/28/2016, 12/15/2018   PFIZER Comirnaty(Gray Top)Covid-19 Tri-Sucrose Vaccine 09/27/2020   PFIZER(Purple Top)SARS-COV-2 Vaccination 04/21/2019, 05/11/2019, 01/10/2020   Pneumococcal Conjugate-13 04/06/2006, 10/11/2013   Pneumococcal Polysaccharide-23 04/28/2016   Tdap 01/29/2017   Zoster Recombinat (Shingrix) 10/26/2018, 10/26/2018, 12/30/2018, 12/30/2018   Zoster, Live 03/28/2007    TDAP status: Up to date  Flu Vaccine status: Up to date  Pneumococcal vaccine status: Up to date  Covid-19 vaccine status: Completed vaccines  Qualifies for Shingles Vaccine? Yes   Zostavax completed Yes   Shingrix Completed?: Yes  Screening Tests Health Maintenance  Topic Date Due   FOOT EXAM  04/27/2020   HEMOGLOBIN A1C  02/06/2021   OPHTHALMOLOGY EXAM  09/26/2021   COLONOSCOPY (Pts 45-38yrs Insurance coverage will need to be confirmed)  10/11/2024   TETANUS/TDAP  01/30/2027   INFLUENZA VACCINE  Completed   DEXA SCAN  Completed   COVID-19 Vaccine  Completed   Zoster Vaccines- Shingrix  Completed   HPV VACCINES  Aged Out    Health Maintenance  Health Maintenance Due  Topic Date Due   FOOT EXAM  04/27/2020    Colorectal cancer screening: No longer required.   Mammogram status: No  longer required due to age.  Bone Density status: Completed 12/19/2020. Results reflect: Bone density results: OSTEOPENIA. Repeat every 5 years.  Lung Cancer Screening: (Low Dose CT Chest recommended if Age 41-80 years, 30 pack-year currently smoking OR  have quit w/in 15years.) does not qualify.   Lung Cancer Screening Referral: n/a  Additional Screening:  Hepatitis C Screening: does not qualify;   Vision Screening: Recommended annual ophthalmology exams for early detection of glaucoma and other disorders of the eye. Is the patient up to date with their annual eye exam?  Yes  Who is the provider or what is the name of the office in which the patient attends annual eye exams? Dr.Scott If pt is not established with a provider, would they like to be referred to a provider to establish care? No .   Dental Screening: Recommended annual dental exams for proper oral hygiene  Community Resource Referral / Chronic Care Management: CRR required this visit?  No   CCM required this visit?  No      Plan:     I have personally reviewed and noted the following in the patient's chart:   Medical and social history Use of alcohol, tobacco or illicit drugs  Current medications and supplements including opioid prescriptions.  Functional ability and status Nutritional status Physical activity Advanced directives List of other physicians Hospitalizations, surgeries, and ER visits in previous 12 months Vitals Screenings to include cognitive, depression, and falls Referrals and appointments  In addition, I have reviewed and discussed with patient certain preventive protocols, quality metrics, and best practice recommendations. A written personalized care plan for preventive services as well as general preventive health recommendations were provided to patient.     Randel Pigg, LPN   [id number]   Nurse Notes: none

## 2020-12-31 NOTE — Patient Instructions (Signed)
[name] , Thank you for taking time to come for your Medicare Wellness Visit. I appreciate your ongoing commitment to your health goals. Please review the following plan we discussed and let me know if I can assist you in the future.   Screening recommendations/referrals: Colonoscopy: no longer required  Mammogram: no longer required  Bone Density: 12/19/2020 Recommended yearly ophthalmology/optometry visit for glaucoma screening and checkup Recommended yearly dental visit for hygiene and checkup  Vaccinations: Influenza vaccine: completed  Pneumococcal vaccine: completed series  Tdap vaccine: 01/29/2017 Shingles vaccine: completed series     Advanced directives: Copies in chart   Conditions/risks identified: none   Next appointment: 02/07/2021 1100am Dr.Burchette   Preventive Care 9 Years and Older, Female Preventive care refers to lifestyle choices and visits with your health care provider that can promote health and wellness. What does preventive care include? A yearly physical exam. This is also called an annual well check. Dental exams once or twice a year. Routine eye exams. Ask your health care provider how often you should have your eyes checked. Personal lifestyle choices, including: Daily care of your teeth and gums. Regular physical activity. Eating a healthy diet. Avoiding tobacco and drug use. Limiting alcohol use. Practicing safe sex. Taking low-dose aspirin every day. Taking vitamin and mineral supplements as recommended by your health care provider. What happens during an annual well check? The services and screenings done by your health care provider during your annual well check will depend on your age, overall health, lifestyle risk factors, and family history of disease. Counseling  Your health care provider may ask you questions about your: Alcohol use. Tobacco use. Drug use. Emotional well-being. Home and relationship well-being. Sexual  activity. Eating habits. History of falls. Memory and ability to understand (cognition). Work and work Statistician. Reproductive health. Screening  You may have the following tests or measurements: Height, weight, and BMI. Blood pressure. Lipid and cholesterol levels. These may be checked every 5 years, or more frequently if you are over 55 years old. Skin check. Lung cancer screening. You may have this screening every year starting at age 23 if you have a 30-pack-year history of smoking and currently smoke or have quit within the past 15 years. Fecal occult blood test (FOBT) of the stool. You may have this test every year starting at age 7. Flexible sigmoidoscopy or colonoscopy. You may have a sigmoidoscopy every 5 years or a colonoscopy every 10 years starting at age 64. Hepatitis C blood test. Hepatitis B blood test. Sexually transmitted disease (STD) testing. Diabetes screening. This is done by checking your blood sugar (glucose) after you have not eaten for a while (fasting). You may have this done every 1-3 years. Bone density scan. This is done to screen for osteoporosis. You may have this done starting at age 62. Mammogram. This may be done every 1-2 years. Talk to your health care provider about how often you should have regular mammograms. Talk with your health care provider about your test results, treatment options, and if necessary, the need for more tests. Vaccines  Your health care provider may recommend certain vaccines, such as: Influenza vaccine. This is recommended every year. Tetanus, diphtheria, and acellular pertussis (Tdap, Td) vaccine. You may need a Td booster every 10 years. Zoster vaccine. You may need this after age 42. Pneumococcal 13-valent conjugate (PCV13) vaccine. One dose is recommended after age 57. Pneumococcal polysaccharide (PPSV23) vaccine. One dose is recommended after age 26. Talk to your health care provider  about which screenings and vaccines  you need and how often you need them. This information is not intended to replace advice given to you by your health care provider. Make sure you discuss any questions you have with your health care provider. Document Released: 04/19/2015 Document Revised: 12/11/2015 Document Reviewed: 01/22/2015 Elsevier Interactive Patient Education  2017 [place] Prevention in the Home Falls can cause injuries. They can happen to people of all ages. There are many things you can do to make your home safe and to help prevent falls. What can I do on the outside of my home? Regularly fix the edges of walkways and driveways and fix any cracks. Remove anything that might make you trip as you walk through a door, such as a raised step or threshold. Trim any bushes or trees on the path to your home. Use bright outdoor lighting. Clear any walking paths of anything that might make someone trip, such as rocks or tools. Regularly check to see if handrails are loose or broken. Make sure that both sides of any steps have handrails. Any raised decks and porches should have guardrails on the edges. Have any leaves, snow, or ice cleared regularly. Use sand or salt on walking paths during winter. Clean up any spills in your garage right away. This includes oil or grease spills. What can I do in the bathroom? Use night lights. Install grab bars by the toilet and in the tub and shower. Do not use towel bars as grab bars. Use non-skid mats or decals in the tub or shower. If you need to sit down in the shower, use a plastic, non-slip stool. Keep the floor dry. Clean up any water that spills on the floor as soon as it happens. Remove soap buildup in the tub or shower regularly. Attach bath mats securely with double-sided non-slip rug tape. Do not have throw rugs and other things on the floor that can make you trip. What can I do in the bedroom? Use night lights. Make sure that you have a light by your bed that  is easy to reach. Do not use any sheets or blankets that are too big for your bed. They should not hang down onto the floor. Have a firm chair that has side arms. You can use this for support while you get dressed. Do not have throw rugs and other things on the floor that can make you trip. What can I do in the kitchen? Clean up any spills right away. Avoid walking on wet floors. Keep items that you use a lot in easy-to-reach places. If you need to reach something above you, use a strong step stool that has a grab bar. Keep electrical cords out of the way. Do not use floor polish or wax that makes floors slippery. If you must use wax, use non-skid floor wax. Do not have throw rugs and other things on the floor that can make you trip. What can I do with my stairs? Do not leave any items on the stairs. Make sure that there are handrails on both sides of the stairs and use them. Fix handrails that are broken or loose. Make sure that handrails are as long as the stairways. Check any carpeting to make sure that it is firmly attached to the stairs. Fix any carpet that is loose or worn. Avoid having throw rugs at the top or bottom of the stairs. If you do have throw rugs, attach them to the floor  with carpet tape. Make sure that you have a light switch at the top of the stairs and the bottom of the stairs. If you do not have them, ask someone to add them for you. What else can I do to help prevent falls? Wear shoes that: Do not have high heels. Have rubber bottoms. Are comfortable and fit you well. Are closed at the toe. Do not wear sandals. If you use a stepladder: Make sure that it is fully opened. Do not climb a closed stepladder. Make sure that both sides of the stepladder are locked into place. Ask someone to hold it for you, if possible. Clearly mark and make sure that you can see: Any grab bars or handrails. First and last steps. Where the edge of each step is. Use tools that help you  move around (mobility aids) if they are needed. These include: Canes. Walkers. Scooters. Crutches. Turn on the lights when you go into a dark area. Replace any light bulbs as soon as they burn out. Set up your furniture so you have a clear path. Avoid moving your furniture around. If any of your floors are uneven, fix them. If there are any pets around you, be aware of where they are. Review your medicines with your doctor. Some medicines can make you feel dizzy. This can increase your chance of falling. Ask your doctor what other things that you can do to help prevent falls. This information is not intended to replace advice given to you by your health care provider. Make sure you discuss any questions you have with your health care provider. Document Released: 01/17/2009 Document Revised: 08/29/2015 Document Reviewed: 04/27/2014 Elsevier Interactive Patient Education  2017 [employer].

## 2021-01-01 ENCOUNTER — Ambulatory Visit (INDEPENDENT_AMBULATORY_CARE_PROVIDER_SITE_OTHER): Payer: Medicare Other | Admitting: Family Medicine

## 2021-01-01 ENCOUNTER — Other Ambulatory Visit: Payer: Self-pay

## 2021-01-01 ENCOUNTER — Ambulatory Visit (INDEPENDENT_AMBULATORY_CARE_PROVIDER_SITE_OTHER): Payer: Medicare Other

## 2021-01-01 VITALS — BP 138/82 | HR 74 | Ht 60.0 in | Wt 124.0 lb

## 2021-01-01 DIAGNOSIS — S92324D Nondisplaced fracture of second metatarsal bone, right foot, subsequent encounter for fracture with routine healing: Secondary | ICD-10-CM | POA: Diagnosis not present

## 2021-01-01 DIAGNOSIS — M8080XD Other osteoporosis with current pathological fracture, unspecified site, subsequent encounter for fracture with routine healing: Secondary | ICD-10-CM | POA: Diagnosis not present

## 2021-01-01 DIAGNOSIS — M79671 Pain in right foot: Secondary | ICD-10-CM | POA: Diagnosis not present

## 2021-01-01 NOTE — Progress Notes (Signed)
   I, Peterson Lombard, LAT, ATC acting as a scribe for Lynne Leader, MD.  [name] is a 80 y.o. female who presents to [place] at [facility] today for  f/u R 2nd MT stress fx. Pt has a hx of osteoporosis. Pt was last seen by Dr. Georgina Snell on 12/18/20 and was advised to f/u on DEXA scan results and cont postop shoe w/ good arch support. Today, pt reports foot feels about the same. Pt notes slight increased pain when she is on her feet a lot. Pt has been compliant in wearing the post-op shoe.  Dx imaging: 12/19/20 DEXA scan 12/18/20 R foot XR 12/02/20 R foot XR  Pertinent review of systems: No fevers or chills  Relevant historical information: Osteoporosis   Exam:  BP 138/82   Pulse 74   Ht 5' (1.524 m)   Wt 124 lb (56.2 kg)   SpO2 98%   BMI 24.22 kg/m  General: Well Developed, well nourished, and in no acute distress.   MSK: Right foot normal-appearing Nontender Normal motion.    Lab and Radiology Results  X-ray images right foot obtained today personally and independently interpreted. Transverse fracture proximal second metatarsal with small callus formation present. Await formal radiology review     Assessment and Plan: 80 y.o. female with right second metatarsal fracture.  Fracture is about a month old now.  Patient has some healing on x-ray and clinically is doing pretty well with only some soreness.  Plan to recheck in about 2 weeks.  Likely at that time will be trying to transition to a more traditional shoe with good arch support if tolerating it well.  DEXA scan shows improved bone mineral density with a T score of -1.7.  She still has osteoporosis because of the fracture but overall doing quite well with current regimen.   PDMP not reviewed this encounter. Orders Placed This Encounter  Procedures   DG Foot Complete Right    Standing Status:   Future    Number of Occurrences:   1    Standing Expiration Date:   01/01/2022    Order Specific  Question:   Reason for Exam (SYMPTOM  OR DIAGNOSIS REQUIRED)    Answer:   right foot pain    Order Specific Question:   Preferred imaging location?    Answer:   [facility]   No orders of the defined types were placed in this encounter.    Discussed warning signs or symptoms. Please see discharge instructions. Patient expresses understanding.   The above documentation has been reviewed and is accurate and complete Lynne Leader, M.D.

## 2021-01-01 NOTE — Patient Instructions (Signed)
Thank you for coming in today.   Continue arch support.  Recheck in 2-3 weeks or so.   Bring some good supportive shoes to the next visit.   Xray at the next visit.   Good job on your bone density.

## 2021-01-06 NOTE — Progress Notes (Signed)
Second metatarsal foot fracture looks stable with some healing present.

## 2021-01-21 ENCOUNTER — Ambulatory Visit (INDEPENDENT_AMBULATORY_CARE_PROVIDER_SITE_OTHER): Payer: Medicare Other

## 2021-01-21 ENCOUNTER — Other Ambulatory Visit: Payer: Self-pay

## 2021-01-21 ENCOUNTER — Ambulatory Visit (INDEPENDENT_AMBULATORY_CARE_PROVIDER_SITE_OTHER): Payer: Medicare Other | Admitting: Family Medicine

## 2021-01-21 ENCOUNTER — Encounter: Payer: Self-pay | Admitting: Family Medicine

## 2021-01-21 VITALS — BP 122/72 | HR 66 | Ht 60.0 in | Wt 125.6 lb

## 2021-01-21 DIAGNOSIS — S92324D Nondisplaced fracture of second metatarsal bone, right foot, subsequent encounter for fracture with routine healing: Secondary | ICD-10-CM

## 2021-01-21 DIAGNOSIS — M79671 Pain in right foot: Secondary | ICD-10-CM | POA: Diagnosis not present

## 2021-01-21 NOTE — Patient Instructions (Signed)
Good to see you.  Use shoes w/ good arch support.  Advance activity as tolerated.  Follow-up in one month.  Plan for a repeat XR prior to next visit.

## 2021-01-21 NOTE — Progress Notes (Signed)
   I, Wendy Poet, LAT, ATC, am serving as scribe for Dr. Lynne Leader.  [name] is a 80 y.o. female who presents to [place] at [facility] today for f/u of R 2nd MT stress fx w/ a hx of osteoporosis.  She was last seen by Dr. Georgina Snell on 01/01/21 and noted no change in her symptoms.  She was advised to con't using her post-op shoe and arch support.  Today, pt reports that her R foot is doing better.  She feels that her most bothersome thing at this point is wearing her R post-op shoe and she is ready to be finished wearing it.  Diagnostic testing: R foot XR- 01/01/21, 12/18/20; 12/02/20; Bone density- 12/19/20  Pertinent review of systems: No fevers or chills  Relevant historical information: Increased bone mineral density   Exam:  BP 122/72 (BP Location: Right Arm, Patient Position: Sitting, Cuff Size: Normal)   Pulse 66   Ht 5' (1.524 m)   Wt 125 lb 9.6 oz (57 kg)   SpO2 97%   BMI 24.53 kg/m  General: Well Developed, well nourished, and in no acute distress.   MSK: Right foot normal-appearing nontender normal foot and ankle motion.    Lab and Radiology Results X-ray images right foot obtained today personally and independently interpreted. Healing (nearing completely healed) proximal second metatarsal fracture No significant displacement. Await formal radiology review     Assessment and Plan: 80 y.o. female with right proximal second metatarsal fracture.  Fracture occurred about 7 weeks ago.  Patient has been immobilized with postop shoe with good arch support since.  Clinically she is doing quite well with no to minimal pain.  Fracture looks to be almost completely healed per my read however radiology overread is still pending.  Plan to transition out of postop shoe to conventional shoe with good arch support and recheck in a month.  Advance activity as tolerated.  Precautions reviewed.   PDMP not reviewed this encounter. Orders Placed This Encounter   Procedures   DG Foot Complete Right    Standing Status:   Future    Number of Occurrences:   1    Standing Expiration Date:   02/21/2021    Order Specific Question:   Reason for Exam (SYMPTOM  OR DIAGNOSIS REQUIRED)    Answer:   R foot pain    Order Specific Question:   Preferred imaging location?    Answer:   [facility]   No orders of the defined types were placed in this encounter.    Discussed warning signs or symptoms. Please see discharge instructions. Patient expresses understanding.   The above documentation has been reviewed and is accurate and complete Lynne Leader, M.D.

## 2021-01-22 NOTE — Progress Notes (Signed)
Right foot x-ray shows that the fracture continues to heal.

## 2021-02-06 ENCOUNTER — Other Ambulatory Visit: Payer: Self-pay

## 2021-02-07 ENCOUNTER — Other Ambulatory Visit: Payer: Medicare Other

## 2021-02-07 ENCOUNTER — Ambulatory Visit (INDEPENDENT_AMBULATORY_CARE_PROVIDER_SITE_OTHER): Payer: Medicare Other | Admitting: Family Medicine

## 2021-02-07 VITALS — BP 130/70 | HR 67 | Temp 97.7°F | Wt 123.7 lb

## 2021-02-07 DIAGNOSIS — M8080XD Other osteoporosis with current pathological fracture, unspecified site, subsequent encounter for fracture with routine healing: Secondary | ICD-10-CM

## 2021-02-07 DIAGNOSIS — E785 Hyperlipidemia, unspecified: Secondary | ICD-10-CM | POA: Diagnosis not present

## 2021-02-07 DIAGNOSIS — E119 Type 2 diabetes mellitus without complications: Secondary | ICD-10-CM

## 2021-02-07 DIAGNOSIS — I10 Essential (primary) hypertension: Secondary | ICD-10-CM

## 2021-02-07 LAB — POCT GLYCOSYLATED HEMOGLOBIN (HGB A1C): Hemoglobin A1C: 6.5 % — AB (ref 4.0–5.6)

## 2021-02-07 NOTE — Progress Notes (Signed)
Established Patient Office Visit  Subjective:  Patient ID: [name], female    DOB: 1941-03-09  Age: 80 y.o. MRN: [id number]  CC:  Chief Complaint  Patient presents with   Follow-up    diabetes    HPI [name] presents for medical follow-up.  She has type 2 diabetes which is being controlled with lifestyle management.  Denies any polyuria or polydipsia.  A1c's have consistently been below 7%.  She has history of osteoporosis.  She had been on bisphosphonates and had left femur fracture which was felt to be possibly related to her medical therapy.  She is obviously been off since that injury in 2019.  She had bone density scan 2020 and then again recently and actually had some improvement right femoral neck.  Her score went from -1.1 in 2020 to -0.9 with recent scan She also had some improvement and AP spine from -2.4 to -1.7.  She does remain on low-dose Climara patch.  Actually taking one half patch once weekly  No recent falls.  She remains on simvastatin for hyperlipidemia.  She is on lisinopril for hypertension.  No headaches or dizziness.  She had labs last May which were stable.  Flu vaccine already given.  Other vaccines up-to-date.  Past Medical History:  Diagnosis Date   Broken femur ([place])    Cancer ([place]) 2001   skin, nose   Cataract    Diabetes mellitus    Borderline/no meds   Diverticulosis of colon    DNR (do not resuscitate) 2009   GERD (gastroesophageal reflux disease)    Heart murmur    Hyperlipidemia    Hypertension    Kidney stones    1 time   Migraines    during menopause   Osteoporosis 09/2018   T score -2.7 stable from prior DEXA.   Pneumothorax on left     Past Surgical History:  Procedure Laterality Date   ABDOMINAL HYSTERECTOMY  1994   fibroids/ complete   BUNIONECTOMY  2004   left   CARPAL TUNNEL RELEASE  2000   right    CATARACT EXTRACTION     Bil   COLONOSCOPY  10/24/2013   FEMUR FRACTURE SURGERY     LUNG SURGERY  2008   left  lung VATS for biopsy and removal of blebs with pleurodesis   OOPHORECTOMY     BSO   POLYPECTOMY     TONSILLECTOMY     as child    Family History  Problem Relation Age of Onset   Cancer Mother        bone   Diabetes Sister    Hyperlipidemia Sister    Ovarian cancer Sister    Pulmonary embolism Father    Cancer Father        Facial   Cancer Sister        melanoma   Emphysema Maternal Aunt    Breast cancer Neg Hx    Colon cancer Neg Hx    Colon polyps Neg Hx    Esophageal cancer Neg Hx    Rectal cancer Neg Hx    Stomach cancer Neg Hx     Social History   Socioeconomic History   Marital status: Divorced    Spouse name: Not on file   Number of children: Not on file   Years of education: Not on file   Highest education level: Not on file  Occupational History   Not on file  Tobacco Use  Smoking status: Never   Smokeless tobacco: Never  Vaping Use   Vaping Use: Never used  Substance and Sexual Activity   Alcohol use: No    Alcohol/week: 0.0 standard drinks   Drug use: No   Sexual activity: Not Currently    Comment: 1st intercourse 80 yo-Fewer than 5 partners  Other Topics Concern   Not on file  Social History Narrative   Not on file   Social Determinants of Health   Financial Resource Strain: Low Risk    Difficulty of Paying Living Expenses: Not hard at all  Food Insecurity: No Food Insecurity   Worried About Charity fundraiser in the Last Year: Never true   [place] in the Last Year: Never true  Transportation Needs: No Transportation Needs   Lack of Transportation (Medical): No   Lack of Transportation (Non-Medical): No  Physical Activity: Inactive   Days of Exercise per Week: 0 days   Minutes of Exercise per Session: 0 min  Stress: No Stress Concern Present   Feeling of Stress : Not at all  Social Connections: Moderately Isolated   Frequency of Communication with Friends and Family: Twice a week   Frequency of Social Gatherings with Friends  and Family: Twice a week   Attends Religious Services: More than 4 times per year   Active Member of [employer] or Organizations: No   Attends Music therapist: Never   Marital Status: Divorced  Human resources officer Violence: Not At Risk   Fear of Current or Ex-Partner: No   Emotionally Abused: No   Physically Abused: No   Sexually Abused: No    Outpatient Medications Prior to Visit  Medication Sig Dispense Refill   APPLE CIDER VINEGAR PO Take 900 mg by mouth daily.      Calcium Citrate-Vitamin D (CALCIUM CITRATE + D3 PO) 1200mg  calcium 2000 IU Vit. D. Take by mouth daily.     Chromium-Cinnamon (CINNAMON PLUS CHROMIUM PO) 1000 mg cinnamon 100 mcg Chromium Take by mouth daily.     cycloSPORINE (RESTASIS) 0.05 % ophthalmic emulsion Place 1 drop into both eyes 2 (two) times daily.     estradiol (CLIMARA - DOSED IN MG/24 HR) 0.075 mg/24hr patch Place 1 patch (0.075 mg total) onto the skin once a week. 4 patch 11   glucose blood test strip Use as instructed tid to check blood sugars. 300 each 3   lisinopril (ZESTRIL) 10 MG tablet TAKE 1 TABLET (10 MG TOTAL) BY MOUTH DAILY. PER DR [place] Wanner 90 tablet 3   MAGNESIUM PO Take 250 mg by mouth daily.     Multiple Vitamin (MULTIVITAMIN PO) Take 1 tablet by mouth daily.     OVER THE COUNTER MEDICATION Omega - 3 1040 mg take one tab daily     simvastatin (ZOCOR) 20 MG tablet TAKE 1 TABLET BY MOUTH EVERY DAY 90 tablet 3   Ultilet Classic Lancets MISC Testing BS three times daily 300 each 3   No facility-administered medications prior to visit.    No Known Allergies  ROS Review of Systems  Constitutional:  Negative for fatigue and unexpected weight change.  Eyes:  Negative for visual disturbance.  Respiratory:  Negative for cough, chest tightness, shortness of breath and wheezing.   Cardiovascular:  Negative for chest pain, palpitations and leg swelling.  Endocrine: Negative for polydipsia and polyuria.  Neurological:  Negative for  dizziness, seizures, syncope, weakness, light-headedness and headaches.     Objective:  Physical Exam Vitals reviewed.  Constitutional:      Appearance: Normal appearance.  Cardiovascular:     Rate and Rhythm: Normal rate and regular rhythm.  Pulmonary:     Effort: Pulmonary effort is normal.     Breath sounds: Normal breath sounds. No wheezing or rales.  Musculoskeletal:     Right lower leg: No edema.     Left lower leg: No edema.  Neurological:     Mental Status: She is alert.    BP 130/70 (BP Location: Left Arm, Patient Position: Sitting, Cuff Size: Normal)   Pulse 67   Temp 97.7 F (36.5 C) (Oral)   Wt 123 lb 11.2 oz (56.1 kg)   SpO2 98%   BMI 24.16 kg/m  Wt Readings from Last 3 Encounters:  02/07/21 123 lb 11.2 oz (56.1 kg)  01/21/21 125 lb 9.6 oz (57 kg)  01/01/21 124 lb (56.2 kg)     Health Maintenance Due  Topic Date Due   FOOT EXAM  04/27/2020   COVID-19 Vaccine (5 - Booster for Pfizer series) 11/22/2020    There are no preventive care reminders to display for this patient.  Lab Results  Component Value Date   TSH 1.55 12/01/2018   Lab Results  Component Value Date   WBC 7.2 08/14/2019   HGB 13.6 08/14/2019   HCT 40.0 08/14/2019   MCV 97.3 08/14/2019   PLT 273.0 08/14/2019   Lab Results  Component Value Date   NA 140 08/06/2020   K 4.8 08/06/2020   CO2 30 08/06/2020   GLUCOSE 108 (H) 08/06/2020   BUN 15 08/06/2020   CREATININE 0.69 08/06/2020   BILITOT 0.9 08/06/2020   ALKPHOS 41 08/06/2020   AST 16 08/06/2020   ALT 16 08/06/2020   PROT 6.6 08/06/2020   ALBUMIN 4.7 08/06/2020   CALCIUM 9.7 08/06/2020   GFR 82.33 08/06/2020   Lab Results  Component Value Date   CHOL 152 08/06/2020   Lab Results  Component Value Date   HDL 61.50 08/06/2020   Lab Results  Component Value Date   LDLCALC 66 08/06/2020   Lab Results  Component Value Date   TRIG 124.0 08/06/2020   Lab Results  Component Value Date   CHOLHDL 2 08/06/2020    Lab Results  Component Value Date   HGBA1C 6.5 (A) 02/07/2021      Assessment & Plan:   #1 type 2 diabetes well controlled with A1c 6.5%.  Continue low glycemic diet.  She is not currently doing any regular exercise but does do her own housework.  Did discuss other potential alternatives for exercise such as elliptical  #2 hypertension stable and at goal.  Continue lisinopril 10 mg daily  #3 hyperlipidemia treated with simvastatin.  Continue simvastatin 20 mg daily and recheck fasting lipids in 6 months  #4 history of osteoporosis.  She had left femur fracture on bisphosphonates.  Recent DEXA scan actually showed some improvement compared to 2 years ago Continue daily calcium, vitamin D, and weightbearing as tolerated   No orders of the defined types were placed in this encounter.   Follow-up: Return in about 6 months (around 08/07/2021).    [name], MD

## 2021-02-07 NOTE — Patient Instructions (Signed)
A1C today was 6.5%  Bone density scan looked great!

## 2021-02-19 ENCOUNTER — Ambulatory Visit: Payer: Medicare Other | Admitting: Family Medicine

## 2021-02-19 NOTE — Progress Notes (Signed)
   I, Wendy Poet, LAT, ATC, am serving as scribe for Dr. Lynne Leader.  [name] is a 80 y.o. female who presents to [place] at [facility] today for  f/u of R 2nd MT stress fx w/ a hx of osteoporosis.  She was last seen by Dr. Georgina Snell on 01/21/21 and was advised to transition out of postop shoe to conventional shoe with good arch support and advance activity as tolerated. Today, pt reports that her R foot is feeling better.  She has been in a regular shoe w/ an insert for about a month.  She notices some minimal discomfort if she's on her feet for an extended period of time but otherwise is doing well.  Dx imaging: 01/21/21 R foot XR 01/01/21 R foot XR 12/19/20 DEXA scan 12/18/20 R foot XR 12/02/20 R foot XR  Pertinent review of systems: No fevers or chills  Relevant historical information: Osteoporosis   Exam:  BP 120/70 (BP Location: Right Arm, Patient Position: Sitting, Cuff Size: Normal)   Pulse 71   Ht 5' (1.524 m)   Wt 123 lb 3.2 oz (55.9 kg)   SpO2 96%   BMI 24.06 kg/m  General: Well Developed, well nourished, and in no acute distress.   MSK: Right foot normal-appearing nontender normal motion.    Lab and Radiology Results  X-ray images right foot obtained today personally and independently interpreted. Callus formation of proximal second metatarsal with faint fracture line still present. Await formal radiology review     Assessment and Plan: 80 y.o. female with right foot proximal second metatarsal fracture versus stress fracture.  This was identified during her visit on August 31.  Clinically she is doing quite well with effectively resolution of her symptoms.  She still has a little bit of healing to do an x-ray but I think we will be able to heal this up on her own just fine.  Plan to continue supportive footwear with arch support.  Recommend against barefoot walking at this time.  Okay to advance activity as tolerated.  Precautions reviewed.   Recheck back as needed. Total encounter time 20 minutes including face-to-face time with the patient and, reviewing past medical record, and charting on the date of service.     PDMP not reviewed this encounter. Orders Placed This Encounter  Procedures   DG Foot Complete Right    Standing Status:   Future    Number of Occurrences:   1    Standing Expiration Date:   02/19/2022    Order Specific Question:   Reason for Exam (SYMPTOM  OR DIAGNOSIS REQUIRED)    Answer:   right foot pain    Order Specific Question:   Preferred imaging location?    Answer:   [facility]   No orders of the defined types were placed in this encounter.    Discussed warning signs or symptoms. Please see discharge instructions. Patient expresses understanding.   The above documentation has been reviewed and is accurate and complete Lynne Leader, M.D.

## 2021-02-20 ENCOUNTER — Encounter: Payer: Self-pay | Admitting: Family Medicine

## 2021-02-20 ENCOUNTER — Ambulatory Visit (INDEPENDENT_AMBULATORY_CARE_PROVIDER_SITE_OTHER): Payer: Medicare Other

## 2021-02-20 ENCOUNTER — Ambulatory Visit (INDEPENDENT_AMBULATORY_CARE_PROVIDER_SITE_OTHER): Payer: Medicare Other | Admitting: Family Medicine

## 2021-02-20 ENCOUNTER — Other Ambulatory Visit: Payer: Self-pay

## 2021-02-20 VITALS — BP 120/70 | HR 71 | Ht 60.0 in | Wt 123.2 lb

## 2021-02-20 DIAGNOSIS — M79671 Pain in right foot: Secondary | ICD-10-CM | POA: Diagnosis not present

## 2021-02-20 DIAGNOSIS — S92324D Nondisplaced fracture of second metatarsal bone, right foot, subsequent encounter for fracture with routine healing: Secondary | ICD-10-CM

## 2021-02-20 NOTE — Patient Instructions (Addendum)
Good to see you today.  I recommend using good arch supports  Advance activity as tolerated  Follow-up as needed

## 2021-02-21 NOTE — Progress Notes (Signed)
Right foot x-ray shows healing fracture.

## 2021-05-13 DIAGNOSIS — D485 Neoplasm of uncertain behavior of skin: Secondary | ICD-10-CM | POA: Diagnosis not present

## 2021-05-13 DIAGNOSIS — Z85828 Personal history of other malignant neoplasm of skin: Secondary | ICD-10-CM | POA: Diagnosis not present

## 2021-05-13 DIAGNOSIS — L814 Other melanin hyperpigmentation: Secondary | ICD-10-CM | POA: Diagnosis not present

## 2021-05-13 DIAGNOSIS — R229 Localized swelling, mass and lump, unspecified: Secondary | ICD-10-CM | POA: Diagnosis not present

## 2021-05-13 DIAGNOSIS — C44722 Squamous cell carcinoma of skin of right lower limb, including hip: Secondary | ICD-10-CM | POA: Diagnosis not present

## 2021-05-13 DIAGNOSIS — Z08 Encounter for follow-up examination after completed treatment for malignant neoplasm: Secondary | ICD-10-CM | POA: Diagnosis not present

## 2021-05-13 DIAGNOSIS — L821 Other seborrheic keratosis: Secondary | ICD-10-CM | POA: Diagnosis not present

## 2021-05-13 DIAGNOSIS — L570 Actinic keratosis: Secondary | ICD-10-CM | POA: Diagnosis not present

## 2021-05-29 DIAGNOSIS — L570 Actinic keratosis: Secondary | ICD-10-CM | POA: Diagnosis not present

## 2021-05-31 ENCOUNTER — Other Ambulatory Visit: Payer: Self-pay | Admitting: Family Medicine

## 2021-05-31 DIAGNOSIS — I10 Essential (primary) hypertension: Secondary | ICD-10-CM

## 2021-07-24 DIAGNOSIS — Z20822 Contact with and (suspected) exposure to covid-19: Secondary | ICD-10-CM | POA: Diagnosis not present

## 2021-08-09 DIAGNOSIS — Z20822 Contact with and (suspected) exposure to covid-19: Secondary | ICD-10-CM | POA: Diagnosis not present

## 2021-08-12 ENCOUNTER — Encounter: Payer: Self-pay | Admitting: Family Medicine

## 2021-08-12 ENCOUNTER — Ambulatory Visit (INDEPENDENT_AMBULATORY_CARE_PROVIDER_SITE_OTHER): Payer: Medicare Other | Admitting: Family Medicine

## 2021-08-12 VITALS — BP 124/70 | HR 70 | Temp 97.6°F | Ht 60.0 in | Wt 125.5 lb

## 2021-08-12 DIAGNOSIS — I10 Essential (primary) hypertension: Secondary | ICD-10-CM

## 2021-08-12 DIAGNOSIS — M8080XD Other osteoporosis with current pathological fracture, unspecified site, subsequent encounter for fracture with routine healing: Secondary | ICD-10-CM

## 2021-08-12 DIAGNOSIS — E785 Hyperlipidemia, unspecified: Secondary | ICD-10-CM | POA: Diagnosis not present

## 2021-08-12 DIAGNOSIS — Z20822 Contact with and (suspected) exposure to covid-19: Secondary | ICD-10-CM | POA: Diagnosis not present

## 2021-08-12 DIAGNOSIS — E119 Type 2 diabetes mellitus without complications: Secondary | ICD-10-CM

## 2021-08-12 LAB — LIPID PANEL
Cholesterol: 144 mg/dL (ref 0–200)
HDL: 55.5 mg/dL (ref >39.00)
NonHDL: 88.88
Total CHOL/HDL Ratio: 3
Triglycerides: 211 mg/dL — ABNORMAL HIGH (ref 0.0–149.0)
VLDL: 42.2 mg/dL — ABNORMAL HIGH (ref 0.0–40.0)

## 2021-08-12 LAB — HEPATIC FUNCTION PANEL
ALT: 18 U/L (ref 0–35)
AST: 24 U/L (ref 0–37)
Albumin: 4.4 g/dL (ref 3.5–5.2)
Alkaline Phosphatase: 43 U/L (ref 39–117)
Bilirubin, Direct: 0.1 mg/dL (ref 0.0–0.3)
Total Bilirubin: 0.8 mg/dL (ref 0.2–1.2)
Total Protein: 6.7 g/dL (ref 6.0–8.3)

## 2021-08-12 LAB — BASIC METABOLIC PANEL
BUN: 19 mg/dL (ref 6–23)
CO2: 28 mEq/L (ref 19–32)
Calcium: 10 mg/dL (ref 8.4–10.5)
Chloride: 103 mEq/L (ref 96–112)
Creatinine, Ser: 0.65 mg/dL (ref 0.40–1.20)
GFR: 82.93 mL/min (ref >60.00)
Glucose, Bld: 127 mg/dL — ABNORMAL HIGH (ref 70–99)
Potassium: 4.5 mEq/L (ref 3.5–5.1)
Sodium: 138 mEq/L (ref 135–145)

## 2021-08-12 LAB — POCT GLYCOSYLATED HEMOGLOBIN (HGB A1C): Hemoglobin A1C: 6.3 % — AB (ref 4.0–5.6)

## 2021-08-12 LAB — LDL CHOLESTEROL, DIRECT: Direct LDL: 73 mg/dL

## 2021-08-12 MED ORDER — ESTRADIOL 0.075 MG/24HR TD PTWK: 0.0750 mg | MEDICATED_PATCH | TRANSDERMAL | 11 refills | Status: DC

## 2021-08-12 NOTE — Progress Notes (Signed)
? ?Established Patient Office Visit ? ?Subjective   ?Patient ID: [name], female    DOB: 04/23/1940  Age: 81 y.o. MRN: [id number] ? ?Chief Complaint  ?Patient presents with  ? Follow-up  ? ? ?HPI ? ? ?[name] is here for medical follow-up.  She has history of hypertension, hyperlipidemia, type 2 diabetes, osteoporosis.  She had left femur fracture when taking Fosamax and is trying to manage this with exercise and does take Climara estrogen patch.  No history of clotting issues, cerebrovascular disease, or CAD.  She is requesting refills.  She does take vitamin D and calcium as well. ? ?Denies any recent falls.  She had right second metatarsal fracture back in the summer and this is finally healed.  She tries to use arch supports now.  Avoids walking barefooted ? ?Generally doing well.  Medications reviewed.  Mains on lisinopril 10 mg daily and simvastatin 20 mg daily.  No recent chest pains.  Blood sugars been very stable.  A1c's have consistently been in the 6 range.  She is very diligent regarding diet ? ?Past Medical History:  ?Diagnosis Date  ? Broken femur ([place])   ? Cancer [name]) 2001  ? skin, nose  ? Cataract   ? Diabetes mellitus   ? Borderline/no meds  ? Diverticulosis of colon   ? DNR (do not resuscitate) 2009  ? GERD (gastroesophageal reflux disease)   ? Heart murmur   ? Hyperlipidemia   ? Hypertension   ? Kidney stones   ? 1 time  ? Migraines   ? during menopause  ? Osteoporosis 09/2018  ? T score -2.7 stable from prior DEXA.  ? Pneumothorax on left   ? ?Past Surgical History:  ?Procedure Laterality Date  ? ABDOMINAL HYSTERECTOMY  1994  ? fibroids/ complete  ? BUNIONECTOMY  2004  ? left  ? CARPAL TUNNEL RELEASE  2000  ? right   ? CATARACT EXTRACTION    ? Bil  ? COLONOSCOPY  10/24/2013  ? FEMUR FRACTURE SURGERY    ? LUNG SURGERY  2008  ? left lung VATS for biopsy and removal of blebs with pleurodesis  ? OOPHORECTOMY    ? BSO  ? POLYPECTOMY    ? TONSILLECTOMY    ? as child  ? ? reports that she has never  smoked. She has never used smokeless tobacco. She reports that she does not drink alcohol and does not use drugs. ?family history includes Cancer in her father, mother, and sister; Diabetes in her sister; Emphysema in her maternal aunt; Hyperlipidemia in her sister; Ovarian cancer in her sister; Pulmonary embolism in her father. ?No Known Allergies ? ?Review of Systems  ?Respiratory:  Negative for cough and shortness of breath.   ?Cardiovascular:  Negative for chest pain.  ?Genitourinary:  Negative for dysuria.  ? ?  ?Objective:  ?  ? ?BP 124/70 (BP Location: Left Arm, Patient Position: Sitting, Cuff Size: Normal)   Pulse 70   Temp 97.6 ?F (36.4 ?C) (Oral)   Ht 5' (1.524 m)   Wt 125 lb 8 oz (56.9 kg)   SpO2 96%   BMI 24.51 kg/m?  ?Wt Readings from Last 3 Encounters:  ?08/12/21 125 lb 8 oz (56.9 kg)  ?02/20/21 123 lb 3.2 oz (55.9 kg)  ?02/07/21 123 lb 11.2 oz (56.1 kg)  ? ?  ? ?Physical Exam ?Vitals reviewed.  ?Constitutional:   ?   Appearance: Normal appearance.  ?Cardiovascular:  ?   Rate and Rhythm: Normal  rate and regular rhythm.  ?   Heart sounds: Murmur heard.  ?Pulmonary:  ?   Effort: Pulmonary effort is normal.  ?   Breath sounds: Normal breath sounds.  ?Musculoskeletal:  ?   Right lower leg: No edema.  ?   Left lower leg: No edema.  ?Neurological:  ?   Mental Status: She is alert.  ? ? ? ?Results for orders placed or performed in visit on 08/12/21  ?POCT glycosylated hemoglobin (Hb A1C)  ?Result Value Ref Range  ? Hemoglobin A1C 6.3 (A) 4.0 - 5.6 %  ? HbA1c POC (<> result, manual entry)    ? HbA1c, POC (prediabetic range)    ? HbA1c, POC (controlled diabetic range)    ? ? ?Last metabolic panel ?Lab Results  ?Component Value Date  ? GLUCOSE 108 (H) 08/06/2020  ? NA 140 08/06/2020  ? K 4.8 08/06/2020  ? CL 104 08/06/2020  ? CO2 30 08/06/2020  ? BUN 15 08/06/2020  ? CREATININE 0.69 08/06/2020  ? GFRNONAA 85 12/01/2018  ? CALCIUM 9.7 08/06/2020  ? PROT 6.6 08/06/2020  ? ALBUMIN 4.7 08/06/2020  ? BILITOT  0.9 08/06/2020  ? ALKPHOS 41 08/06/2020  ? AST 16 08/06/2020  ? ALT 16 08/06/2020  ? ?Last lipids ?Lab Results  ?Component Value Date  ? CHOL 152 08/06/2020  ? HDL 61.50 08/06/2020  ? [place] 66 08/06/2020  ? LDLDIRECT 74.0 04/08/2012  ? TRIG 124.0 08/06/2020  ? CHOLHDL 2 08/06/2020  ? ?Last hemoglobin A1c ?Lab Results  ?Component Value Date  ? HGBA1C 6.3 (A) 08/12/2021  ? ?  ? ?The ASCVD Risk score (Arnett DK, et al., 2019) failed to calculate for the following reasons: ?  The 2019 ASCVD risk score is only valid for ages 60 to 17 ? ?  ?Assessment & Plan:  ? ?#1 hypertension stable and at goal.  Continue lisinopril 10 mg daily. ? ?#2 postmenopausal.  She has osteoporosis as above.  History of femur fracture on Fosamax.  Refill Climara patch.  Continue regular weightbearing exercise and calcium vitamin D. ? ?#3 hyperlipidemia treated with simvastatin 20 mg daily.  Recheck lipid and hepatic panel ? ?#4 type 2 diabetes.  Remains well controlled with lifestyle modification with A1c today 6.3%. ?Set up 53-monthfollow-up and recheck A1c then ? ? ?Return in about 6 months (around 02/12/2022).  ? ? ?BCarolann Littler MD ? ?

## 2021-08-13 DIAGNOSIS — Z23 Encounter for immunization: Secondary | ICD-10-CM | POA: Diagnosis not present

## 2021-08-31 ENCOUNTER — Other Ambulatory Visit: Payer: Self-pay | Admitting: Family Medicine

## 2021-08-31 DIAGNOSIS — I10 Essential (primary) hypertension: Secondary | ICD-10-CM

## 2021-09-09 ENCOUNTER — Other Ambulatory Visit: Payer: Self-pay | Admitting: Family Medicine

## 2021-09-09 DIAGNOSIS — Z1231 Encounter for screening mammogram for malignant neoplasm of breast: Secondary | ICD-10-CM

## 2021-10-20 DIAGNOSIS — H43811 Vitreous degeneration, right eye: Secondary | ICD-10-CM | POA: Diagnosis not present

## 2021-10-23 ENCOUNTER — Ambulatory Visit
Admission: RE | Admit: 2021-10-23 | Discharge: 2021-10-23 | Disposition: A | Discharge status: Home / Self Care | Payer: Medicare Other | Source: Ambulatory Visit | Attending: Family Medicine | Admitting: Family Medicine

## 2021-10-23 DIAGNOSIS — Z1231 Encounter for screening mammogram for malignant neoplasm of breast: Secondary | ICD-10-CM

## 2021-11-11 DIAGNOSIS — L989 Disorder of the skin and subcutaneous tissue, unspecified: Secondary | ICD-10-CM | POA: Diagnosis not present

## 2021-11-11 DIAGNOSIS — C44722 Squamous cell carcinoma of skin of right lower limb, including hip: Secondary | ICD-10-CM | POA: Diagnosis not present

## 2021-11-11 DIAGNOSIS — Z85828 Personal history of other malignant neoplasm of skin: Secondary | ICD-10-CM | POA: Diagnosis not present

## 2021-11-11 DIAGNOSIS — R229 Localized swelling, mass and lump, unspecified: Secondary | ICD-10-CM | POA: Diagnosis not present

## 2021-11-11 DIAGNOSIS — D485 Neoplasm of uncertain behavior of skin: Secondary | ICD-10-CM | POA: Diagnosis not present

## 2021-11-11 DIAGNOSIS — Z08 Encounter for follow-up examination after completed treatment for malignant neoplasm: Secondary | ICD-10-CM | POA: Diagnosis not present

## 2021-11-11 DIAGNOSIS — D1801 Hemangioma of skin and subcutaneous tissue: Secondary | ICD-10-CM | POA: Diagnosis not present

## 2021-11-11 DIAGNOSIS — L821 Other seborrheic keratosis: Secondary | ICD-10-CM | POA: Diagnosis not present

## 2021-11-14 DIAGNOSIS — H43811 Vitreous degeneration, right eye: Secondary | ICD-10-CM | POA: Diagnosis not present

## 2021-11-14 LAB — HM DIABETES EYE EXAM

## 2021-11-17 ENCOUNTER — Encounter: Payer: Self-pay | Admitting: Family Medicine

## 2021-11-19 ENCOUNTER — Telehealth: Payer: Medicare Other

## 2021-11-21 ENCOUNTER — Other Ambulatory Visit: Payer: Self-pay | Admitting: Family Medicine

## 2021-11-21 DIAGNOSIS — I10 Essential (primary) hypertension: Secondary | ICD-10-CM

## 2021-12-02 DIAGNOSIS — L570 Actinic keratosis: Secondary | ICD-10-CM | POA: Diagnosis not present

## 2021-12-02 DIAGNOSIS — L578 Other skin changes due to chronic exposure to nonionizing radiation: Secondary | ICD-10-CM | POA: Diagnosis not present

## 2021-12-02 DIAGNOSIS — C44319 Basal cell carcinoma of skin of other parts of face: Secondary | ICD-10-CM | POA: Diagnosis not present

## 2021-12-16 DIAGNOSIS — Z23 Encounter for immunization: Secondary | ICD-10-CM | POA: Diagnosis not present

## 2021-12-18 ENCOUNTER — Ambulatory Visit: Payer: Medicare Other

## 2021-12-18 ENCOUNTER — Telehealth: Payer: Self-pay | Admitting: Family Medicine

## 2021-12-18 NOTE — Telephone Encounter (Signed)
Left message for patient to call back and schedule Medicare Annual Wellness Visit (AWV) either virtually or in office. Left  my [name] number [phone]   Last AWV 12/31/20 ; please schedule at anytime with [facility] Nurse Health Advisor 1 or 2

## 2022-01-01 ENCOUNTER — Ambulatory Visit (INDEPENDENT_AMBULATORY_CARE_PROVIDER_SITE_OTHER): Payer: Medicare Other

## 2022-01-01 VITALS — Ht 60.0 in | Wt 125.0 lb

## 2022-01-01 DIAGNOSIS — Z0000 Encounter for general adult medical examination without abnormal findings: Secondary | ICD-10-CM

## 2022-01-01 NOTE — Progress Notes (Signed)
Subjective:   [name] is a 81 y.o. female who presents for Medicare Annual (Subsequent) preventive examination.  Review of Systems    Virtual Visit via Telephone Note  I connected with  [name] on 01/01/22 at  9:45 AM EDT by telephone and verified that I am speaking with the correct person using two identifiers.  Location: Patient: Home Provider: Office Persons participating in the virtual visit: patient/Nurse Health Advisor   I discussed the limitations, risks, security and privacy concerns of performing an evaluation and management service by telephone and the availability of in person appointments. The patient expressed understanding and agreed to proceed.  Interactive audio and video telecommunications were attempted between this nurse and patient, however failed, due to patient having technical difficulties OR patient did not have access to video capability.  We continued and completed visit with audio only.  Some vital signs may be absent or patient reported.   Criselda Peaches, LPN  Cardiac Risk Factors include: advanced age (>42mn, >>53women);diabetes mellitus;hypertension     Objective:    Today's Vitals   01/01/22 0949  Weight: 125 lb (56.7 kg)  Height: 5' (1.524 m)   Body mass index is 24.41 kg/m.     01/01/2022    9:56 AM 12/31/2020   10:42 AM 12/18/2019    1:25 PM 12/02/2016   11:27 AM  Advanced Directives  Does Patient Have a Medical Advance Directive? Yes Yes Yes Yes  Type of AParamedicof [place]mun[place]Living will HM[place]amiLiving will HHallidayLiving will   Does patient want to make changes to medical advance directive? No - Patient declined  No - Patient declined   Copy of [place] Chart? Yes - validated most recent copy scanned in chart (See row information) Yes - validated most recent copy scanned in chart (See row information) Yes - validated most recent copy  scanned in chart (See row information)     Current Medications (verified) Outpatient Encounter Medications as of 01/01/2022  Medication Sig   APPLE CIDER VINEGAR PO Take 900 mg by mouth daily.    Calcium Citrate-Vitamin D (CALCIUM CITRATE + D3 PO) '1200mg'$  calcium 2000 IU Vit. D. Take by mouth daily.   Chromium-Cinnamon (CINNAMON PLUS CHROMIUM PO) 1000 mg cinnamon 100 mcg Chromium Take by mouth daily.   cycloSPORINE (RESTASIS) 0.05 % ophthalmic emulsion Place 1 drop into both eyes 2 (two) times daily.   estradiol (CLIMARA - DOSED IN MG/24 HR) 0.075 mg/24hr patch Place 1 patch (0.075 mg total) onto the skin once a week.   glucose blood test strip Use as instructed tid to check blood sugars.   lisinopril (ZESTRIL) 10 MG tablet TAKE 1 TABLET (10 MG TOTAL) BY MOUTH DAILY. PER DR BURCHETTE   MAGNESIUM PO Take 250 mg by mouth daily.   Multiple Vitamin (MULTIVITAMIN PO) Take 1 tablet by mouth daily.   OVER THE COUNTER MEDICATION Omega - 3 1040 mg take one tab daily   simvastatin (ZOCOR) 20 MG tablet TAKE 1 TABLET BY MOUTH EVERY DAY   Ultilet Classic Lancets MISC Testing BS three times daily   No facility-administered encounter medications on file as of 01/01/2022.    Allergies (verified) Patient has no known allergies.   History: Past Medical History:  Diagnosis Date   [place] femur [place] [facility]    [place]   skin, nose   Cataract    Diabetes mellitus    Borderline/no  meds   Diverticulosis of colon    DNR (do not resuscitate) 2009   GERD (gastroesophageal reflux disease)    Heart murmur    Hyperlipidemia    Hypertension    Kidney stones    1 time   Migraines    during menopause   Osteoporosis 09/2018   T score -2.7 stable from prior DEXA.   Pneumothorax on left    Past Surgical History:  Procedure Laterality Date   ABDOMINAL HYSTERECTOMY  1994   fibroids/ complete   BUNIONECTOMY  2004   left   CARPAL TUNNEL RELEASE  2000   right    CATARACT EXTRACTION     Bil    COLONOSCOPY  10/24/2013   FEMUR FRACTURE SURGERY     LUNG SURGERY  2008   left lung VATS for biopsy and removal of blebs with pleurodesis   OOPHORECTOMY     BSO   POLYPECTOMY     TONSILLECTOMY     as child   Family History  Problem Relation Age of Onset   Cancer Mother        bone   Diabetes Sister    Hyperlipidemia Sister    Ovarian cancer Sister    Pulmonary embolism Father    Cancer Father        Facial   Cancer Sister        melanoma   Emphysema Maternal Aunt    Breast cancer Neg Hx    Colon cancer Neg Hx    Colon polyps Neg Hx    Esophageal cancer Neg Hx    Rectal cancer Neg Hx    Stomach cancer Neg Hx    Social History   Socioeconomic History   Marital status: Divorced    Spouse name: Not on file   Number of children: Not on file   Years of education: Not on file   Highest education level: Not on file  Occupational History   Not on file  Tobacco Use   Smoking status: Never   Smokeless tobacco: Never  Vaping Use   Vaping Use: Never used  Substance and Sexual Activity   Alcohol use: No    Alcohol/week: 0.0 standard drinks of alcohol   Drug use: No   Sexual activity: Not Currently    Comment: 1st intercourse 81 yo-Fewer than 5 partners  Other Topics Concern   Not on file  Social History Narrative   Not on file   Social Determinants of Health   Financial Resource Strain: Low Risk  (01/01/2022)   Overall Financial Resource Strain (CARDIA)    Difficulty of Paying Living Expenses: Not hard at all  Food Insecurity: No Food Insecurity (01/01/2022)   Hunger Vital Sign    Worried About Running Out of Food in the Last Year: Never true    Ran Out of Food in the Last Year: Never true  Transportation Needs: No Transportation Needs (01/01/2022)   PRAPARE - Hydrologist (Medical): No    Lack of Transportation (Non-Medical): No  Physical Activity: Inactive (01/01/2022)   Exercise Vital Sign    Days of Exercise per Week: 0 days     Minutes of Exercise per Session: 0 min  Stress: No Stress Concern Present (01/01/2022)   [place]    Feeling of Stress : Not at all  Social Connections: Moderately Integrated (01/01/2022)   Social Connection and Isolation Panel [NHANES]    Frequency  of Communication with Friends and Family: More than three times a week    Frequency of Social Gatherings with Friends and Family: More than three times a week    Attends Religious Services: More than 4 times per year    Active Member of [employer] or Organizations: Yes    Attends Music therapist: More than 4 times per year    Marital Status: Divorced    Tobacco Counseling Counseling given: Not Answered   Clinical Intake: Nutrition Risk Assessment:  Has the patient had any N/V/D within the last 2 months?  No  Does the patient have any non-healing wounds?  No  Has the patient had any unintentional weight loss or weight gain?  No   Diabetes:  Is the patient diabetic?  Yes  If diabetic, was a CBG obtained today?  No  Did the patient bring in their glucometer from home?  No  How often do you monitor your CBG's? Daily.   Financial Strains and Diabetes Management:  Are you having any financial strains with the device, your supplies or your medication? No .  Does the patient want to be seen by Chronic Care Management for management of their diabetes?  No  Would the patient like to be referred to a Nutritionist or for Diabetic Management?  No   Diabetic Exams:  Diabetic Eye Exam: Completed No. Overdue for diabetic eye exam. Pt has been advised about the importance in completing this exam. A referral has been placed today. Message sent to referral coordinator for scheduling purposes. Advised pt to expect a call from office referred to regarding appt.  Diabetic Foot Exam: Completed No. Pt has been advised about the importance in completing this exam. Pt is scheduled  for diabetic foot exam on Followed by PCP.   Pre-visit preparation completed: No  Pain : No/denies pain     BMI - recorded: 24.41 Nutritional Status: BMI of 19-24  Normal Nutritional Risks: None Diabetes: Yes CBG done?: No Did pt. bring in CBG monitor from home?: No  How often do you need to have someone help you when you read instructions, pamphlets, or other written materials from your doctor or pharmacy?: 1 - Never  Diabetic?  Yes  Interpreter Needed?: No  Information entered by :: Rolene Arbour LPN   Activities of Daily Living    01/01/2022    9:54 AM  In your present state of health, do you have any difficulty performing the following activities:  Hearing? 0  Vision? 0  Difficulty concentrating or making decisions? 0  Walking or climbing stairs? 0  Dressing or bathing? 0  Doing errands, shopping? 0  Preparing Food and eating ? N  Using the Toilet? N  In the past six months, have you accidently leaked urine? Y  Comment Wears pads. Followed by Urologist  Do you have problems with loss of bowel control? N  Managing your Medications? N  Managing your Finances? N  Housekeeping or managing your Housekeeping? N    Patient Care Team: Eulas Post, MD as PCP - General (Family Medicine) Viona Gilmore, [facility] as Pharmacist (Pharmacist)  Indicate any recent Medical Services you may have received from other than [facility] providers in the past year (date may be approximate).     Assessment:   This is a routine wellness examination for [place].  Hearing/Vision screen Hearing Screening - Comments:: Denies hearing difficulties   Vision Screening - Comments:: Wears rx glasses - up to date with routine eye  exams with  Dr Nicki Reaper  Dietary issues and exercise activities discussed: Current Exercise Habits: The patient does not participate in regular exercise at present, Exercise limited by: None identified   Goals Addressed               This Visit's Progress     No  current goals (pt-stated)         Depression Screen    01/01/2022    9:53 AM 08/12/2021    1:44 PM 12/31/2020   10:43 AM 12/31/2020   10:40 AM 12/18/2019    1:29 PM 07/28/2019    1:12 PM 04/29/2018   10:20 AM  PHQ 2/9 Scores  PHQ - 2 Score 0 0 0 0 0 0 0  PHQ- 9 Score  1   0  0    Fall Risk    01/01/2022    9:55 AM 08/12/2021    1:45 PM 12/31/2020   10:44 AM 12/18/2019    1:26 PM 07/28/2019    1:12 PM  [place] in the past year? 0 0 0 0 0  Number falls in past yr: 0 0 0 0 0  Injury with Fall? 0 0 0 0 0  Risk for fall due to : No Fall Risks No Fall Risks  Medication side effect   Follow up Falls prevention discussed Falls evaluation completed Falls evaluation completed Falls evaluation completed;Falls prevention discussed Falls evaluation completed    FALL RISK PREVENTION PERTAINING TO THE HOME:  Any stairs in or around the home? Yes  If so, are there any without handrails? No  Home free of loose throw rugs in walkways, pet beds, electrical cords, etc? Yes  Adequate lighting in your home to reduce risk of falls? Yes   ASSISTIVE DEVICES UTILIZED TO PREVENT FALLS:  Life alert? No  Use of a cane, walker or w/c? No  Grab bars in the bathroom? Yes  Shower chair or bench in shower? Yes  Elevated toilet seat or a handicapped toilet? No   TIMED UP AND GO:  Was the test performed? No . Audio Visit   Cognitive Function:        01/01/2022    9:56 AM  6CIT Screen  What Year? 0 points  What month? 0 points  What time? 0 points  Count back from 20 0 points  Months in reverse 0 points  Repeat phrase 0 points  Total Score 0 points    Immunizations Immunization History  Administered Date(s) Administered   Fluad Quad(high Dose 65+) 12/15/2018, 12/19/2019, 12/18/2020, 12/16/2021   Influenza Split 01/01/2012   Influenza Whole 12/27/2007, 12/06/2010   Influenza, High Dose Seasonal PF 01/11/2013, 12/07/2014, 12/06/2015, 12/02/2016   Influenza,inj,Quad PF,6+ Mos  12/28/2013   Influenza-Unspecified 12/01/2016, 12/28/2016, 12/15/2018   PFIZER Comirnaty(Gray Top)Covid-19 Tri-Sucrose Vaccine 09/27/2020   PFIZER(Purple Top)SARS-COV-2 Vaccination 04/21/2019, 05/11/2019, 01/10/2020   Pneumococcal Conjugate-13 04/06/2006, 10/11/2013   Pneumococcal Polysaccharide-23 04/28/2016   Tdap 01/29/2017   Zoster Recombinat (Shingrix) 10/26/2018, 10/26/2018, 12/30/2018, 12/30/2018   Zoster, Live 03/28/2007    TDAP status: Up to date  Flu Vaccine status: Up to date  Pneumococcal vaccine status: Completed during today's visit.  Covid-19 vaccine status: Completed vaccines  Qualifies for Shingles Vaccine? Yes   Zostavax completed Yes   Shingrix Completed?: Yes  Screening Tests Health Maintenance  Topic Date Due   Diabetic kidney evaluation - Urine ACR  Never done   FOOT EXAM  04/27/2020   COVID-19 Vaccine (5 - [employer]  series) 01/17/2022 (Originally 11/22/2020)   HEMOGLOBIN A1C  02/12/2022   Diabetic kidney evaluation - GFR measurement  08/13/2022   OPHTHALMOLOGY EXAM  11/15/2022   COLONOSCOPY (Pts 45-32yr Insurance coverage will need to be confirmed)  10/11/2024   TETANUS/TDAP  01/30/2027   Pneumonia Vaccine 81 Years old  Completed   INFLUENZA VACCINE  Completed   DEXA SCAN  Completed   Zoster Vaccines- Shingrix  Completed   HPV VACCINES  Aged Out    Health Maintenance  Health Maintenance Due  Topic Date Due   Diabetic kidney evaluation - Urine ACR  Never done   FOOT EXAM  04/27/2020    Colorectal cancer screening: Type of screening: Colonoscopy. Completed 10/12/19. Repeat every 5 years  Mammogram status: No longer required due to Age.  Bone Density status: Completed 12/19/20. Results reflect: Bone density results: OSTEOPOROSIS. Repeat every   years.  Lung Cancer Screening: (Low Dose CT Chest recommended if Age 81-80years, 30 pack-year currently smoking OR have quit w/in 15years.) does not qualify.    Additional Screening:  Hepatitis C  Screening: does not qualify; Completed   Vision Screening: Recommended annual ophthalmology exams for early detection of glaucoma and other disorders of the eye. Is the patient up to date with their annual eye exam?  Yes  Who is the provider or what is the name of the office in which the patient attends annual eye exams? Dr SNicki ReaperIf pt is not established with a provider, would they like to be referred to a provider to establish care? No .   Dental Screening: Recommended annual dental exams for proper oral hygiene  Community Resource Referral / Chronic Care Management:  CRR required this visit?  No   CCM required this visit?  No      Plan:     I have personally reviewed and noted the following in the patient's chart:   Medical and social history Use of alcohol, tobacco or illicit drugs  Current medications and supplements including opioid prescriptions. Patient is not currently taking opioid prescriptions. Functional ability and status Nutritional status Physical activity Advanced directives List of other physicians Hospitalizations, surgeries, and ER visits in previous 12 months Vitals Screenings to include cognitive, depression, and falls Referrals and appointments  In addition, I have reviewed and discussed with patient certain preventive protocols, quality metrics, and best practice recommendations. A written personalized care plan for preventive services as well as general preventive health recommendations were provided to patient.     BCriselda Peaches LPN   [id number]  Nurse Notes: Patient due labs Diabetic kidney evaluation- Urein ACR

## 2022-01-01 NOTE — Patient Instructions (Addendum)
[name] , Thank you for taking time to come for your Medicare Wellness Visit. I appreciate your ongoing commitment to your health goals. Please review the following plan we discussed and let me know if I can assist you in the future.   These are the goals we discussed:  Goals       Exercise 150 minutes per week (moderate activity)      Will walk more  Watch unhealthy carbs       No current goals (pt-stated)        This is a list of the screening recommended for you and due dates:  Health Maintenance  Topic Date Due   Yearly kidney health urinalysis for diabetes  Never done   Complete foot exam   04/27/2020   COVID-19 Vaccine (5 - Pf[employer] series) 01/17/2022*   Hemoglobin A1C  02/12/2022   Yearly kidney function blood test for diabetes  08/13/2022   Eye exam for diabetics  11/15/2022   Colon Cancer Screening  10/11/2024   Tetanus Vaccine  01/30/2027   Pneumonia Vaccine  Completed   Flu Shot  Completed   DEXA scan (bone density measurement)  Completed   Zoster (Shingles) Vaccine  Completed   HPV Vaccine  Aged Out  *Topic was postponed. The date shown is not the original due date.    Advanced directives: In chart  Conditions/risks identified: None  Next appointment: Follow up in one year for your annual wellness visit     Preventive Care 65 Years and Older, Female Preventive care refers to lifestyle choices and visits with your health care provider that can promote health and wellness. What does preventive care include? A yearly physical exam. This is also called an annual well check. Dental exams once or twice a year. Routine eye exams. Ask your health care provider how often you should have your eyes checked. Personal lifestyle choices, including: Daily care of your teeth and gums. Regular physical activity. Eating a healthy diet. Avoiding tobacco and drug use. Limiting alcohol use. Practicing safe sex. Taking low-dose aspirin every day. Taking vitamin and mineral  supplements as recommended by your health care provider. What happens during an annual well check? The services and screenings done by your health care provider during your annual well check will depend on your age, overall health, lifestyle risk factors, and family history of disease. Counseling  Your health care provider may ask you questions about your: Alcohol use. Tobacco use. Drug use. Emotional well-being. Home and relationship well-being. Sexual activity. Eating habits. History of falls. Memory and ability to understand (cognition). Work and work Statistician. Reproductive health. Screening  You may have the following tests or measurements: Height, weight, and BMI. Blood pressure. Lipid and cholesterol levels. These may be checked every 5 years, or more frequently if you are over 44 years old. Skin check. Lung cancer screening. You may have this screening every year starting at age 36 if you have a 30-pack-year history of smoking and currently smoke or have quit within the past 15 years. Fecal occult blood test (FOBT) of the stool. You may have this test every year starting at age 1. Flexible sigmoidoscopy or colonoscopy. You may have a sigmoidoscopy every 5 years or a colonoscopy every 10 years starting at age 24. Hepatitis C blood test. Hepatitis B blood test. Sexually transmitted disease (STD) testing. Diabetes screening. This is done by checking your blood sugar (glucose) after you have not eaten for a while (fasting). You may have this  done every 1-3 years. Bone density scan. This is done to screen for osteoporosis. You may have this done starting at age 26. Mammogram. This may be done every 1-2 years. Talk to your health care provider about how often you should have regular mammograms. Talk with your health care provider about your test results, treatment options, and if necessary, the need for more tests. Vaccines  Your health care provider may recommend certain  vaccines, such as: Influenza vaccine. This is recommended every year. Tetanus, diphtheria, and acellular pertussis (Tdap, Td) vaccine. You may need a Td booster every 10 years. Zoster vaccine. You may need this after age 64. Pneumococcal 13-valent conjugate (PCV13) vaccine. One dose is recommended after age 92. Pneumococcal polysaccharide (PPSV23) vaccine. One dose is recommended after age 38. Talk to your health care provider about which screenings and vaccines you need and how often you need them. This information is not intended to replace advice given to you by your health care provider. Make sure you discuss any questions you have with your health care provider. Document Released: 04/19/2015 Document Revised: 12/11/2015 Document Reviewed: 01/22/2015 Elsevier Interactive Patient Education  2017 [employer] Prevention in the Home Falls can cause injuries. They can happen to people of all ages. There are many things you can do to make your home safe and to help prevent falls. What can I do on the outside of my home? Regularly fix the edges of walkways and driveways and fix any cracks. Remove anything that might make you trip as you walk through a door, such as a raised step or threshold. Trim any bushes or trees on the path to your home. Use bright outdoor lighting. Clear any walking paths of anything that might make someone trip, such as rocks or tools. Regularly check to see if handrails are loose or broken. Make sure that both sides of any steps have handrails. Any raised decks and porches should have guardrails on the edges. Have any leaves, snow, or ice cleared regularly. Use sand or salt on walking paths during winter. Clean up any spills in your garage right away. This includes oil or grease spills. What can I do in the bathroom? Use night lights. Install grab bars by the toilet and in the tub and shower. Do not use towel bars as grab bars. Use non-skid mats or decals in  the tub or shower. If you need to sit down in the shower, use a plastic, non-slip stool. Keep the floor dry. Clean up any water that spills on the floor as soon as it happens. Remove soap buildup in the tub or shower regularly. Attach bath mats securely with double-sided non-slip rug tape. Do not have throw rugs and other things on the floor that can make you trip. What can I do in the bedroom? Use night lights. Make sure that you have a light by your bed that is easy to reach. Do not use any sheets or blankets that are too big for your bed. They should not hang down onto the floor. Have a firm chair that has side arms. You can use this for support while you get dressed. Do not have throw rugs and other things on the floor that can make you trip. What can I do in the kitchen? Clean up any spills right away. Avoid walking on wet floors. Keep items that you use a lot in easy-to-reach places. If you need to reach something above you, use a strong step stool that  has a grab bar. Keep electrical cords out of the way. Do not use floor polish or wax that makes floors slippery. If you must use wax, use non-skid floor wax. Do not have throw rugs and other things on the floor that can make you trip. What can I do with my stairs? Do not leave any items on the stairs. Make sure that there are handrails on both sides of the stairs and use them. Fix handrails that are broken or loose. Make sure that handrails are as long as the stairways. Check any carpeting to make sure that it is firmly attached to the stairs. Fix any carpet that is loose or worn. Avoid having throw rugs at the top or bottom of the stairs. If you do have throw rugs, attach them to the floor with carpet tape. Make sure that you have a light switch at the top of the stairs and the bottom of the stairs. If you do not have them, ask someone to add them for you. What else can I do to help prevent falls? Wear shoes that: Do not have high  heels. Have rubber bottoms. Are comfortable and fit you well. Are closed at the toe. Do not wear sandals. If you use a stepladder: Make sure that it is fully opened. Do not climb a closed stepladder. Make sure that both sides of the stepladder are locked into place. Ask someone to hold it for you, if possible. Clearly mark and make sure that you can see: Any grab bars or handrails. First and last steps. Where the edge of each step is. Use tools that help you move around (mobility aids) if they are needed. These include: Canes. Walkers. Scooters. Crutches. Turn on the lights when you go into a dark area. Replace any light bulbs as soon as they burn out. Set up your furniture so you have a clear path. Avoid moving your furniture around. If any of your floors are uneven, fix them. If there are any pets around you, be aware of where they are. Review your medicines with your doctor. Some medicines can make you feel dizzy. This can increase your chance of falling. Ask your doctor what other things that you can do to help prevent falls. This information is not intended to replace advice given to you by your health care provider. Make sure you discuss any questions you have with your health care provider. Document Released: 01/17/2009 Document Revised: 08/29/2015 Document Reviewed: 04/27/2014 Elsevier Interactive Patient Education  2017 [employer].

## 2022-01-06 DIAGNOSIS — D485 Neoplasm of uncertain behavior of skin: Secondary | ICD-10-CM | POA: Diagnosis not present

## 2022-01-06 DIAGNOSIS — C44319 Basal cell carcinoma of skin of other parts of face: Secondary | ICD-10-CM | POA: Diagnosis not present

## 2022-01-06 DIAGNOSIS — L905 Scar conditions and fibrosis of skin: Secondary | ICD-10-CM | POA: Diagnosis not present

## 2022-01-12 DIAGNOSIS — C44319 Basal cell carcinoma of skin of other parts of face: Secondary | ICD-10-CM | POA: Diagnosis not present

## 2022-02-13 ENCOUNTER — Ambulatory Visit (INDEPENDENT_AMBULATORY_CARE_PROVIDER_SITE_OTHER): Payer: Medicare Other

## 2022-02-13 ENCOUNTER — Encounter: Payer: Self-pay | Admitting: Family Medicine

## 2022-02-13 ENCOUNTER — Ambulatory Visit (INDEPENDENT_AMBULATORY_CARE_PROVIDER_SITE_OTHER): Payer: Medicare Other | Admitting: Family Medicine

## 2022-02-13 VITALS — BP 112/70 | HR 65 | Temp 97.4°F | Ht 60.0 in | Wt 122.7 lb

## 2022-02-13 DIAGNOSIS — M79671 Pain in right foot: Secondary | ICD-10-CM | POA: Diagnosis not present

## 2022-02-13 DIAGNOSIS — E119 Type 2 diabetes mellitus without complications: Secondary | ICD-10-CM | POA: Diagnosis not present

## 2022-02-13 DIAGNOSIS — M898X7 Other specified disorders of bone, ankle and foot: Secondary | ICD-10-CM | POA: Diagnosis not present

## 2022-02-13 DIAGNOSIS — M898X9 Other specified disorders of bone, unspecified site: Secondary | ICD-10-CM

## 2022-02-13 DIAGNOSIS — I10 Essential (primary) hypertension: Secondary | ICD-10-CM | POA: Diagnosis not present

## 2022-02-13 DIAGNOSIS — E785 Hyperlipidemia, unspecified: Secondary | ICD-10-CM

## 2022-02-13 LAB — POCT GLYCOSYLATED HEMOGLOBIN (HGB A1C): Hemoglobin A1C: 6.3 % — AB (ref 4.0–5.6)

## 2022-02-13 MED ORDER — SIMVASTATIN 20 MG PO TABS: 20.0000 mg | ORAL_TABLET | Freq: Every day | ORAL | 3 refills | Status: DC

## 2022-02-13 MED ORDER — LISINOPRIL 10 MG PO TABS: 10.0000 mg | ORAL_TABLET | Freq: Every day | ORAL | 3 refills | Status: DC

## 2022-02-13 NOTE — Progress Notes (Signed)
Established Patient Office Visit  Subjective   Patient ID: [name], female    DOB: 04-23-1940  Age: 81 y.o. MRN: [id number]  No chief complaint on file.   HPI   [name] has history of migraine headaches, type II diabetes, hyperlipidemia.  Also has history of osteoporosis and was on Fosamax and had spontaneous fracture of femur several years ago.  She has had some anxiety regarding going out walking for exercise since then.  Generally doing well.  She does note bony mass right foot distal second metatarsal around the MTP joint.  Nonpainful with ambulation.  She recalls prior history of fracture of second metatarsal more proximally sometime back.  Blood sugars have consistently been well controlled and managed without medication.  She had 1 recent episode where she felt like her blood sugar may be low but when she checked it was 83.  She takes lisinopril 10 mg daily for hypertension and simvastatin 20 mg daily for hyperlipidemia.  Lipids and chemistries were checked last May and stable.  She had flu vaccine.  Mammograms up-to-date.  Past Medical History:  Diagnosis Date   Broken femur ([place])    Cancer ([place]) 2001   skin, nose   Cataract    Diabetes mellitus    Borderline/no meds   Diverticulosis of colon    DNR (do not resuscitate) 2009   GERD (gastroesophageal reflux disease)    Heart murmur    Hyperlipidemia    Hypertension    Kidney stones    1 time   Migraines    during menopause   Osteoporosis 09/2018   T score -2.7 stable from prior DEXA.   Pneumothorax on left    Past Surgical History:  Procedure Laterality Date   ABDOMINAL HYSTERECTOMY  1994   fibroids/ complete   BUNIONECTOMY  2004   left   CARPAL TUNNEL RELEASE  2000   right    CATARACT EXTRACTION     Bil   COLONOSCOPY  10/24/2013   FEMUR FRACTURE SURGERY     LUNG SURGERY  2008   left lung VATS for biopsy and removal of blebs with pleurodesis   OOPHORECTOMY     BSO   POLYPECTOMY     TONSILLECTOMY      as child    reports that she has never smoked. She has never used smokeless tobacco. She reports that she does not drink alcohol and does not use drugs. family history includes Cancer in her father, mother, and sister; Diabetes in her sister; Emphysema in her maternal aunt; Hyperlipidemia in her sister; Ovarian cancer in her sister; Pulmonary embolism in her father. No Known Allergies  Review of Systems  Constitutional:  Negative for malaise/fatigue.  Eyes:  Negative for blurred vision.  Respiratory:  Negative for shortness of breath.   Cardiovascular:  Negative for chest pain.  Neurological:  Negative for dizziness, weakness and headaches.      Objective:     BP 112/70 (BP Location: Left Arm, Patient Position: Sitting, Cuff Size: Normal)   Pulse 65   Temp (!) 97.4 F (36.3 C) (Oral)   Ht 5' (1.524 m)   Wt 122 lb 11.2 oz (55.7 kg)   SpO2 98%   BMI 23.96 kg/m    Physical Exam Constitutional:      Appearance: She is well-developed.  Eyes:     Pupils: Pupils are equal, round, and reactive to light.  Neck:     Thyroid: No thyromegaly.  Vascular: No JVD.  Cardiovascular:     Rate and Rhythm: Normal rate and regular rhythm.     Heart sounds:     No gallop.  Pulmonary:     Effort: Pulmonary effort is normal. No respiratory distress.     Breath sounds: Normal breath sounds. No wheezing or rales.  Musculoskeletal:     Cervical back: Neck supple.     Comments: Right foot reveals firm bony prominence dorsally near the second MTP joint.  Nontender to palpation.  Full range of motion second digit.  Neurological:     Mental Status: She is alert.      Results for orders placed or performed in visit on 02/13/22  POCT glycosylated hemoglobin (Hb A1C)  Result Value Ref Range   Hemoglobin A1C 6.3 (A) 4.0 - 5.6 %   HbA1c POC (<> result, manual entry)     HbA1c, POC (prediabetic range)     HbA1c, POC (controlled diabetic range)        The ASCVD Risk score (Arnett DK,  et al., 2019) failed to calculate for the following reasons:   The 2019 ASCVD risk score is only valid for ages 35 to 33    Assessment & Plan:   #1 type 2 diabetes.  Well-controlled with diet and exercise.  A1c today 6.3% which is unchanged from last visit 6 months ago.  Continue low glycemic diet.  Recheck A1c in 6 months.  Consider urine microalbumin with next labs -Continue yearly diabetic eye exam  #2 hypertension stable and at goal.  Continue lisinopril 10 mg daily.  Printed prescription for 1 year  #3 hyperlipidemia.  Patient on simvastatin 20 mg daily.  Printed prescription for 1 year.  Recheck lipids and chemistries in 6 months  #4 Bony mass right foot near the second MTP joint.  Question callus from old injury versus other.  Obtain plain films as initial evaluation   Return in about 6 months (around 08/14/2022).    [name], MD

## 2022-02-20 ENCOUNTER — Telehealth: Payer: Self-pay | Admitting: Family Medicine

## 2022-02-20 NOTE — Telephone Encounter (Signed)
Patient has more questions regarding the xray of her foot

## 2022-02-20 NOTE — Telephone Encounter (Signed)
Pt reports she has already received x-ray result on her Right foot and forgot to ask a question. She said she broken her metatarsal on same foot last August and want to know if it is healing.   Please advise.

## 2022-02-23 NOTE — Telephone Encounter (Signed)
Patient returned call regarding labs

## 2022-02-23 NOTE — Telephone Encounter (Signed)
Contact patient. Left a voicemail for patient to call us back.

## 2022-02-24 NOTE — Telephone Encounter (Signed)
Patient informed of the results and verbalized understanding 

## 2022-04-14 DIAGNOSIS — H532 Diplopia: Secondary | ICD-10-CM | POA: Diagnosis not present

## 2022-05-19 DIAGNOSIS — L570 Actinic keratosis: Secondary | ICD-10-CM | POA: Diagnosis not present

## 2022-05-19 DIAGNOSIS — Z08 Encounter for follow-up examination after completed treatment for malignant neoplasm: Secondary | ICD-10-CM | POA: Diagnosis not present

## 2022-05-19 DIAGNOSIS — D1801 Hemangioma of skin and subcutaneous tissue: Secondary | ICD-10-CM | POA: Diagnosis not present

## 2022-05-19 DIAGNOSIS — R229 Localized swelling, mass and lump, unspecified: Secondary | ICD-10-CM | POA: Diagnosis not present

## 2022-05-19 DIAGNOSIS — C44722 Squamous cell carcinoma of skin of right lower limb, including hip: Secondary | ICD-10-CM | POA: Diagnosis not present

## 2022-05-19 DIAGNOSIS — L821 Other seborrheic keratosis: Secondary | ICD-10-CM | POA: Diagnosis not present

## 2022-05-19 DIAGNOSIS — Z85828 Personal history of other malignant neoplasm of skin: Secondary | ICD-10-CM | POA: Diagnosis not present

## 2022-06-30 ENCOUNTER — Other Ambulatory Visit: Payer: Self-pay | Admitting: Family Medicine

## 2022-06-30 DIAGNOSIS — Z1231 Encounter for screening mammogram for malignant neoplasm of breast: Secondary | ICD-10-CM

## 2022-08-14 ENCOUNTER — Ambulatory Visit: Payer: Medicare Other | Admitting: Family Medicine

## 2022-08-17 DIAGNOSIS — L570 Actinic keratosis: Secondary | ICD-10-CM | POA: Diagnosis not present

## 2022-08-17 DIAGNOSIS — L905 Scar conditions and fibrosis of skin: Secondary | ICD-10-CM | POA: Diagnosis not present

## 2022-08-17 DIAGNOSIS — Z08 Encounter for follow-up examination after completed treatment for malignant neoplasm: Secondary | ICD-10-CM | POA: Diagnosis not present

## 2022-08-17 DIAGNOSIS — L578 Other skin changes due to chronic exposure to nonionizing radiation: Secondary | ICD-10-CM | POA: Diagnosis not present

## 2022-08-17 DIAGNOSIS — Z85828 Personal history of other malignant neoplasm of skin: Secondary | ICD-10-CM | POA: Diagnosis not present

## 2022-08-17 DIAGNOSIS — L820 Inflamed seborrheic keratosis: Secondary | ICD-10-CM | POA: Diagnosis not present

## 2022-09-06 ENCOUNTER — Other Ambulatory Visit: Payer: Self-pay | Admitting: Family Medicine

## 2022-09-15 ENCOUNTER — Ambulatory Visit (INDEPENDENT_AMBULATORY_CARE_PROVIDER_SITE_OTHER): Payer: Medicare Other | Admitting: Family Medicine

## 2022-09-15 VITALS — BP 130/70 | HR 64 | Temp 97.8°F | Ht 60.0 in | Wt 124.6 lb

## 2022-09-15 DIAGNOSIS — I10 Essential (primary) hypertension: Secondary | ICD-10-CM | POA: Diagnosis not present

## 2022-09-15 DIAGNOSIS — E785 Hyperlipidemia, unspecified: Secondary | ICD-10-CM | POA: Diagnosis not present

## 2022-09-15 DIAGNOSIS — Z780 Asymptomatic menopausal state: Secondary | ICD-10-CM

## 2022-09-15 DIAGNOSIS — E119 Type 2 diabetes mellitus without complications: Secondary | ICD-10-CM | POA: Diagnosis not present

## 2022-09-15 DIAGNOSIS — M8080XD Other osteoporosis with current pathological fracture, unspecified site, subsequent encounter for fracture with routine healing: Secondary | ICD-10-CM

## 2022-09-15 LAB — COMPREHENSIVE METABOLIC PANEL
ALT: 22 U/L (ref 0–35)
AST: 21 U/L (ref 0–37)
Albumin: 4.6 g/dL (ref 3.5–5.2)
Alkaline Phosphatase: 42 U/L (ref 39–117)
BUN: 14 mg/dL (ref 6–23)
CO2: 29 mEq/L (ref 19–32)
Calcium: 9.7 mg/dL (ref 8.4–10.5)
Chloride: 99 mEq/L (ref 96–112)
Creatinine, Ser: 0.72 mg/dL (ref 0.40–1.20)
GFR: 78.16 mL/min (ref >60.00)
Glucose, Bld: 140 mg/dL — ABNORMAL HIGH (ref 70–99)
Potassium: 4.4 mEq/L (ref 3.5–5.1)
Sodium: 133 mEq/L — ABNORMAL LOW (ref 135–145)
Total Bilirubin: 0.7 mg/dL (ref 0.2–1.2)
Total Protein: 7 g/dL (ref 6.0–8.3)

## 2022-09-15 LAB — LIPID PANEL
Cholesterol: 153 mg/dL (ref 0–200)
HDL: 61.6 mg/dL (ref >39.00)
LDL Cholesterol: 62 mg/dL (ref 0–99)
NonHDL: 91.41
Total CHOL/HDL Ratio: 2
Triglycerides: 145 mg/dL (ref 0.0–149.0)
VLDL: 29 mg/dL (ref 0.0–40.0)

## 2022-09-15 LAB — POCT GLYCOSYLATED HEMOGLOBIN (HGB A1C): Hemoglobin A1C: 6.7 % — AB (ref 4.0–5.6)

## 2022-09-15 NOTE — Progress Notes (Signed)
Established Patient Office Visit  Subjective   Patient ID: [name], female    DOB: 1940/05/04  Age: 82 y.o. MRN: [id number]  Chief Complaint  Patient presents with   Medical Management of Chronic Issues    HPI   [name] is seen for chronic medical follow-up.  She has hypertension treated with lisinopril 10 mg daily.  Tolerating well with no side effects.  No chronic cough.  Blood pressures been stable.  No recent dizziness.  Hyperlipidemia treated with simvastatin 20 mg daily.  She is due for follow-up lipids.  No significant myalgias.  She has osteoporosis.  She remains on estradiol.  She is getting regular mammograms.  She will be due for DEXA scan this September.  She takes calcium and vitamin D.  Denies any recent falls.  She has type 2 diabetes treated with diet and lifestyle.  Has been able to avoid medications.  Has been somewhat less strict with diet recently.  Past Medical History:  Diagnosis Date   Broken femur ([facility])    Cancer ([facility]) 2001   skin, nose   Cataract    Diabetes mellitus    Borderline/no meds   Diverticulosis of colon    DNR (do not resuscitate) 2009   GERD (gastroesophageal reflux disease)    Heart murmur    Hyperlipidemia    Hypertension    Kidney stones    1 time   Migraines    during menopause   Osteoporosis 09/2018   T score -2.7 stable from prior DEXA.   Pneumothorax on left    Past Surgical History:  Procedure Laterality Date   ABDOMINAL HYSTERECTOMY  1994   fibroids/ complete   BUNIONECTOMY  2004   left   CARPAL TUNNEL RELEASE  2000   right    CATARACT EXTRACTION     Bil   COLONOSCOPY  10/24/2013   FEMUR FRACTURE SURGERY     LUNG SURGERY  2008   left lung VATS for biopsy and removal of blebs with pleurodesis   OOPHORECTOMY     BSO   POLYPECTOMY     TONSILLECTOMY     as child    reports that she has never smoked. She has never used smokeless tobacco. She reports that she does not drink alcohol and does not use  drugs. family history includes Cancer in her father, mother, and sister; Diabetes in her sister; Emphysema in her maternal aunt; Hyperlipidemia in her sister; Ovarian cancer in her sister; Pulmonary embolism in her father. No Known Allergies  Review of Systems  Constitutional:  Negative for malaise/fatigue and weight loss.  Eyes:  Negative for blurred vision.  Respiratory:  Negative for shortness of breath.   Cardiovascular:  Negative for chest pain.  Gastrointestinal:  Negative for abdominal pain.  Neurological:  Negative for dizziness, weakness and headaches.      Objective:     BP 130/70 (BP Location: Left Arm, Patient Position: Sitting, Cuff Size: Normal)   Pulse 64   Temp 97.8 F (36.6 C) (Oral)   Ht 5' (1.524 m)   Wt 124 lb 9.6 oz (56.5 kg)   SpO2 98%   BMI 24.33 kg/m  BP Readings from Last 3 Encounters:  09/15/22 130/70  02/13/22 112/70  08/12/21 124/70   Wt Readings from Last 3 Encounters:  09/15/22 124 lb 9.6 oz (56.5 kg)  02/13/22 122 lb 11.2 oz (55.7 kg)  01/01/22 125 lb (56.7 kg)      Physical Exam Constitutional:  Appearance: She is well-developed.  Eyes:     Pupils: Pupils are equal, round, and reactive to light.  Neck:     Thyroid: No thyromegaly.     Vascular: No JVD.  Cardiovascular:     Rate and Rhythm: Normal rate and regular rhythm.     Heart sounds: Murmur heard.     No gallop.     Comments: Soft 2/6 systolic murmur right upper sternal border Pulmonary:     Effort: Pulmonary effort is normal. No respiratory distress.     Breath sounds: Normal breath sounds. No wheezing or rales.  Musculoskeletal:     Cervical back: Neck supple.     Right lower leg: No edema.     Left lower leg: No edema.  Neurological:     Mental Status: She is alert.      Results for orders placed or performed in visit on 09/15/22  POC HgB A1c  Result Value Ref Range   Hemoglobin A1C 6.7 (A) 4.0 - 5.6 %   HbA1c POC (<> result, manual entry)     HbA1c, POC  (prediabetic range)     HbA1c, POC (controlled diabetic range)      Last CBC Lab Results  Component Value Date   WBC 7.2 08/14/2019   HGB 13.6 08/14/2019   HCT 40.0 08/14/2019   MCV 97.3 08/14/2019   MCH 32.8 07/25/2012   RDW 13.6 08/14/2019   PLT 273.0 08/14/2019   Last metabolic panel Lab Results  Component Value Date   GLUCOSE 127 (H) 08/12/2021   NA 138 08/12/2021   K 4.5 08/12/2021   CL 103 08/12/2021   CO2 28 08/12/2021   BUN 19 08/12/2021   CREATININE 0.65 08/12/2021   GFRNONAA 85 12/01/2018   CALCIUM 10.0 08/12/2021   PROT 6.7 08/12/2021   ALBUMIN 4.4 08/12/2021   BILITOT 0.8 08/12/2021   ALKPHOS 43 08/12/2021   AST 24 08/12/2021   ALT 18 08/12/2021   Last lipids Lab Results  Component Value Date   CHOL 144 08/12/2021   HDL 55.50 08/12/2021   LDLCALC 66 08/06/2020   LDLDIRECT 73.0 08/12/2021   TRIG 211.0 (H) 08/12/2021   CHOLHDL 3 08/12/2021   Last hemoglobin A1c Lab Results  Component Value Date   HGBA1C 6.7 (A) 09/15/2022      The ASCVD Risk score (Arnett DK, et al., 2019) failed to calculate for the following reasons:   The 2019 ASCVD risk score is only valid for ages 26 to 23    Assessment & Plan:   Problem List Items Addressed This Visit       Unprioritized   Osteoporosis   Relevant Orders   DG Bone Density   Type 2 diabetes mellitus, controlled ([facility]) - Primary   Relevant Orders   POC HgB A1c (Completed)   Hyperlipidemia   Relevant Orders   Lipid panel   CMP   Essential hypertension   Relevant Orders   CMP   Other Visit Diagnoses     Postmenopausal       Relevant Orders   DG Bone Density     Type 2 diabetes well-controlled with A1c 6.7%.  She plans to tighten up her diet with restricting sugars and high glycemic foods and recheck in 6 months  -Set up repeat DEXA scan for this September which will make 2 years from last scan.  Continue regular weightbearing along with calcium and vitamin D  -Check follow-up labs with  lipid panel and CMP with  her simvastatin therapy.  -Blood pressure stable and at goal on lisinopril 10 mg daily.  Continue current dosage.  Continue low-sodium diet  Return in about 6 months (around 03/17/2023).    Evelena Peat, MD

## 2022-09-16 ENCOUNTER — Telehealth: Payer: Self-pay | Admitting: Family Medicine

## 2022-09-16 NOTE — Telephone Encounter (Signed)
Spoke to pt. See lab note 

## 2022-09-16 NOTE — Telephone Encounter (Signed)
Pt called, returning CMA's call. CMA was with a patient Pt asked that CMA call back at her earliest convenience. 

## 2022-09-30 NOTE — Telephone Encounter (Signed)
I spoke with the patient an informed her that it can take some time to receive via mail and to contact our office if she has not received this by the end of week.

## 2022-09-30 NOTE — Telephone Encounter (Addendum)
Pt stated that she asked that a hard copy be mailed to her, and she still has not received it.   Please advise.

## 2022-10-06 NOTE — Telephone Encounter (Signed)
Pt called and says she still has not received

## 2022-10-06 NOTE — Telephone Encounter (Signed)
Pt confirming her address if [address]

## 2022-10-06 NOTE — Telephone Encounter (Signed)
Noted results mailed.

## 2022-10-29 ENCOUNTER — Ambulatory Visit
Admission: RE | Admit: 2022-10-29 | Discharge: 2022-10-29 | Disposition: A | Discharge status: Home / Self Care | Payer: Medicare Other | Source: Ambulatory Visit | Attending: Family Medicine | Admitting: Family Medicine

## 2022-10-29 DIAGNOSIS — Z1231 Encounter for screening mammogram for malignant neoplasm of breast: Secondary | ICD-10-CM | POA: Diagnosis not present

## 2022-11-26 DIAGNOSIS — E119 Type 2 diabetes mellitus without complications: Secondary | ICD-10-CM | POA: Diagnosis not present

## 2022-11-26 LAB — HM DIABETES EYE EXAM

## 2022-12-01 DIAGNOSIS — D485 Neoplasm of uncertain behavior of skin: Secondary | ICD-10-CM | POA: Diagnosis not present

## 2022-12-01 DIAGNOSIS — R229 Localized swelling, mass and lump, unspecified: Secondary | ICD-10-CM | POA: Diagnosis not present

## 2022-12-01 DIAGNOSIS — Z08 Encounter for follow-up examination after completed treatment for malignant neoplasm: Secondary | ICD-10-CM | POA: Diagnosis not present

## 2022-12-01 DIAGNOSIS — C44722 Squamous cell carcinoma of skin of right lower limb, including hip: Secondary | ICD-10-CM | POA: Diagnosis not present

## 2022-12-01 DIAGNOSIS — L821 Other seborrheic keratosis: Secondary | ICD-10-CM | POA: Diagnosis not present

## 2022-12-01 DIAGNOSIS — Z85828 Personal history of other malignant neoplasm of skin: Secondary | ICD-10-CM | POA: Diagnosis not present

## 2022-12-01 DIAGNOSIS — D229 Melanocytic nevi, unspecified: Secondary | ICD-10-CM | POA: Diagnosis not present

## 2022-12-01 DIAGNOSIS — L570 Actinic keratosis: Secondary | ICD-10-CM | POA: Diagnosis not present

## 2022-12-01 DIAGNOSIS — L814 Other melanin hyperpigmentation: Secondary | ICD-10-CM | POA: Diagnosis not present

## 2022-12-05 ENCOUNTER — Other Ambulatory Visit: Payer: Self-pay | Admitting: Family Medicine

## 2022-12-05 DIAGNOSIS — I10 Essential (primary) hypertension: Secondary | ICD-10-CM

## 2022-12-05 DIAGNOSIS — Z23 Encounter for immunization: Secondary | ICD-10-CM | POA: Diagnosis not present

## 2022-12-22 ENCOUNTER — Ambulatory Visit (HOSPITAL_BASED_OUTPATIENT_CLINIC_OR_DEPARTMENT_OTHER)
Admission: RE | Admit: 2022-12-22 | Discharge: 2022-12-22 | Disposition: A | Discharge status: Home / Self Care | Payer: Medicare Other | Source: Ambulatory Visit | Attending: Family Medicine | Admitting: Family Medicine

## 2022-12-22 DIAGNOSIS — M810 Age-related osteoporosis without current pathological fracture: Secondary | ICD-10-CM | POA: Diagnosis not present

## 2022-12-22 DIAGNOSIS — Z780 Asymptomatic menopausal state: Secondary | ICD-10-CM | POA: Insufficient documentation

## 2022-12-22 DIAGNOSIS — M8080XD Other osteoporosis with current pathological fracture, unspecified site, subsequent encounter for fracture with routine healing: Secondary | ICD-10-CM | POA: Diagnosis not present

## 2022-12-23 ENCOUNTER — Other Ambulatory Visit (HOSPITAL_BASED_OUTPATIENT_CLINIC_OR_DEPARTMENT_OTHER): Payer: Medicare Other

## 2022-12-30 ENCOUNTER — Ambulatory Visit: Payer: Medicare Other

## 2022-12-30 VITALS — Ht 60.0 in | Wt 124.0 lb

## 2022-12-30 DIAGNOSIS — Z0000 Encounter for general adult medical examination without abnormal findings: Secondary | ICD-10-CM | POA: Diagnosis not present

## 2022-12-30 NOTE — Patient Instructions (Addendum)
[name] , Thank you for taking time to come for your Medicare Wellness Visit. I appreciate your ongoing commitment to your health goals. Please review the following plan we discussed and let me know if I can assist you in the future.   Referrals/Orders/Follow-Ups/Clinician Recommendations:   This is a list of the screening recommended for you and due dates:  Health Maintenance  Topic Date Due   Yearly kidney health urinalysis for diabetes  Never done   Eye exam for diabetics  11/15/2022   COVID-19 Vaccine (5 - 2023-24 season) 12/06/2022   Complete foot exam   02/14/2023   Hemoglobin A1C  03/17/2023   Yearly kidney function blood test for diabetes  09/15/2023   Medicare Annual Wellness Visit  12/30/2023   Colon Cancer Screening  10/11/2024   DTaP/Tdap/Td vaccine (2 - Td or Tdap) 01/30/2027   Pneumonia Vaccine  Completed   Flu Shot  Completed   DEXA scan (bone density measurement)  Completed   Zoster (Shingles) Vaccine  Completed   HPV Vaccine  Aged Out    Advanced directives: (In Chart) A copy of your advanced directives are scanned into your chart should your provider ever need it.  Next Medicare Annual Wellness Visit scheduled for next year: Yes

## 2022-12-30 NOTE — Progress Notes (Signed)
Subjective:   [name] is a 82 y.o. female who presents for Medicare Annual (Subsequent) preventive examination.  Visit Complete: Virtual  I connected with  [name] on 12/30/22 by a audio enabled telemedicine application and verified that I am speaking with the correct person using two identifiers.  Patient Location: Home  Provider Location: Home Office  I discussed the limitations of evaluation and management by telemedicine. The patient expressed understanding and agreed to proceed.   Because this visit was a virtual/telehealth visit, some criteria may be missing or patient reported. Any vitals not documented were not able to be obtained and vitals that have been documented are patient reported.    Cardiac Risk Factors include: advanced age (>60men, >5 women);diabetes mellitus;hypertension     Objective:    Today's Vitals   12/30/22 1002  Weight: 124 lb (56.2 kg)  Height: 5' (1.524 m)   Body mass index is 24.22 kg/m.     12/30/2022   10:11 AM 01/01/2022    9:56 AM 12/31/2020   10:42 AM 12/18/2019    1:25 PM 12/02/2016   11:27 AM  Advanced Directives  Does Patient Have a Medical Advance Directive? Yes Yes Yes Yes Yes  Type of Estate agent of [place];Living will Healthcare Power of [place];Living will Healthcare Power of [place];Living will Healthcare Power of [place];Living will   Does patient want to make changes to medical advance directive? No - Patient declined No - Patient declined  No - Patient declined   Copy of Healthcare Power of Attorney in Chart? Yes - validated most recent copy scanned in chart (See row information) Yes - validated most recent copy scanned in chart (See row information) Yes - validated most recent copy scanned in chart (See row information) Yes - validated most recent copy scanned in chart (See row information)     Current Medications (verified) Outpatient Encounter Medications as of 12/30/2022  Medication Sig    APPLE CIDER VINEGAR PO Take 900 mg by mouth daily.    Calcium Citrate-Vitamin D (CALCIUM CITRATE + D3 PO) 1200mg  calcium 2000 IU Vit. D. Take by mouth daily.   Chromium-Cinnamon (CINNAMON PLUS CHROMIUM PO) 1000 mg cinnamon 100 mcg Chromium Take by mouth daily.   cycloSPORINE (RESTASIS) 0.05 % ophthalmic emulsion Place 1 drop into both eyes 2 (two) times daily.   estradiol (CLIMARA - DOSED IN MG/24 HR) 0.075 mg/24hr patch PLACE 1 PATCH (0.075 MG TOTAL) ONTO THE SKIN ONCE A WEEK.   glucose blood test strip Use as instructed tid to check blood sugars.   lisinopril (ZESTRIL) 10 MG tablet TAKE 1 TABLET DAILY   MAGNESIUM PO Take 250 mg by mouth daily.   Multiple Vitamin (MULTIVITAMIN PO) Take 1 tablet by mouth daily.   OVER THE COUNTER MEDICATION Omega - 3 1040 mg take one tab daily   simvastatin (ZOCOR) 20 MG tablet Take 1 tablet (20 mg total) by mouth daily.   Ultilet Classic Lancets MISC Testing BS three times daily   No facility-administered encounter medications on file as of 12/30/2022.    Allergies (verified) Patient has no known allergies.   History: Past Medical History:  Diagnosis Date   Broken femur (HCC)    Cancer (HCC) 2001   skin, nose   Cataract    Diabetes mellitus    Borderline/no meds   Diverticulosis of colon    DNR (do not resuscitate) 2009   GERD (gastroesophageal reflux disease)    Heart murmur  Hyperlipidemia    Hypertension    Kidney stones    1 time   Migraines    during menopause   Osteoporosis 09/2018   T score -2.7 stable from prior DEXA.   Pneumothorax on left    Past Surgical History:  Procedure Laterality Date   ABDOMINAL HYSTERECTOMY  1994   fibroids/ complete   BUNIONECTOMY  2004   left   CARPAL TUNNEL RELEASE  2000   right    CATARACT EXTRACTION     Bil   COLONOSCOPY  10/24/2013   FEMUR FRACTURE SURGERY     LUNG SURGERY  2008   left lung VATS for biopsy and removal of blebs with pleurodesis   OOPHORECTOMY     BSO    POLYPECTOMY     TONSILLECTOMY     as child   Family History  Problem Relation Age of Onset   Cancer Mother        bone   Diabetes Sister    Hyperlipidemia Sister    Ovarian cancer Sister    Pulmonary embolism Father    Cancer Father        Facial   Cancer Sister        melanoma   Emphysema Maternal Aunt    Breast cancer Neg Hx    Colon cancer Neg Hx    Colon polyps Neg Hx    Esophageal cancer Neg Hx    Rectal cancer Neg Hx    Stomach cancer Neg Hx    Social History   Socioeconomic History   Marital status: Divorced    Spouse name: Not on file   Number of children: Not on file   Years of education: Not on file   Highest education level: Not on file  Occupational History   Not on file  Tobacco Use   Smoking status: Never   Smokeless tobacco: Never  Vaping Use   Vaping status: Never Used  Substance and Sexual Activity   Alcohol use: No    Alcohol/week: 0.0 standard drinks of alcohol   Drug use: No   Sexual activity: Not Currently    Comment: 1st intercourse 82 yo-Fewer than 5 partners  Other Topics Concern   Not on file  Social History Narrative   Not on file   Social Determinants of Health   Financial Resource Strain: Low Risk  (12/30/2022)   Overall Financial Resource Strain (CARDIA)    Difficulty of Paying Living Expenses: Not hard at all  Food Insecurity: No Food Insecurity (12/30/2022)   Hunger Vital Sign    Worried About Running Out of Food in the Last Year: Never true    Ran Out of Food in the Last Year: Never true  Transportation Needs: No Transportation Needs (12/30/2022)   PRAPARE - Administrator, Civil Service (Medical): No    Lack of Transportation (Non-Medical): No  Physical Activity: Inactive (12/30/2022)   Exercise Vital Sign    Days of Exercise per Week: 0 days    Minutes of Exercise per Session: 0 min  Stress: No Stress Concern Present (12/30/2022)   [employer] of Occupational Health - Occupational Stress Questionnaire     Feeling of Stress : Not at all  Social Connections: Moderately Integrated (12/30/2022)   Social Connection and Isolation Panel [NHANES]    Frequency of Communication with Friends and Family: More than three times a week    Frequency of Social Gatherings with Friends and Family: More than three times a  week    Attends Religious Services: More than 4 times per year    Active Member of Clubs or Organizations: Yes    Attends Engineer, structural: More than 4 times per year    Marital Status: Divorced    Tobacco Counseling Counseling given: Not Answered   Clinical Intake:  Pre-visit preparation completed: Yes  Pain : No/denies pain     BMI - recorded: 24.22 Nutritional Status: BMI of 19-24  Normal Nutritional Risks: None Diabetes: Yes CBG done?: No Did pt. bring in CBG monitor from home?: No  How often do you need to have someone help you when you read instructions, pamphlets, or other written materials from your doctor or pharmacy?: 1 - Never  Interpreter Needed?: No  Information entered by :: Theresa Mulligan LPN   Activities of Daily Living    12/30/2022   10:08 AM 01/01/2022    9:54 AM  In your present state of health, do you have any difficulty performing the following activities:  Hearing? 0 0  Vision? 0 0  Difficulty concentrating or making decisions? 0 0  Walking or climbing stairs? 0 0  Dressing or bathing? 0 0  Doing errands, shopping? 0 0  Preparing Food and eating ? N N  Using the Toilet? N N  In the past six months, have you accidently leaked urine? Malvin Johns  Comment Wears breifs. Followed by medical attention Wears pads. Followed by Urologist  Do you have problems with loss of bowel control? N N  Managing your Medications? N N  Managing your Finances? N N  Housekeeping or managing your Housekeeping? N N    Patient Care Team: Kristian Covey, MD as PCP - General (Family Medicine) Verner Chol, [facility] (Inactive) as Pharmacist  (Pharmacist)  Indicate any recent Medical Services you may have received from other than [facility] providers in the past year (date may be approximate).     Assessment:   This is a routine wellness examination for [name].  Hearing/Vision screen Hearing Screening - Comments:: Denies hearing difficulties   Vision Screening - Comments:: Wears rx glasses - up to date with routine eye exams with  Dr Lorin Picket    Goals Addressed               This Visit's Progress     Stay active (pt-stated)        Eat healthy.       Depression Screen    12/30/2022   10:07 AM 01/01/2022    9:53 AM 08/12/2021    1:44 PM 12/31/2020   10:43 AM 12/31/2020   10:40 AM 12/18/2019    1:29 PM 07/28/2019    1:12 PM  PHQ 2/9 Scores  PHQ - 2 Score 0 0 0 0 0 0 0  PHQ- 9 Score   1   0     Fall Risk    12/30/2022   10:09 AM 01/01/2022    9:55 AM 08/12/2021    1:45 PM 12/31/2020   10:44 AM 12/18/2019    1:26 PM  Fall Risk   Falls in the past year? 0 0 0 0 0  Number falls in past yr: 0 0 0 0 0  Injury with Fall? 0 0 0 0 0  Risk for fall due to : No Fall Risks No Fall Risks No Fall Risks  Medication side effect  Follow up Falls prevention discussed Falls prevention discussed Falls evaluation completed Falls evaluation completed Falls evaluation completed;Falls prevention  discussed    MEDICARE RISK AT HOME: Medicare Risk at Home Any stairs in or around the home?: Yes If so, are there any without handrails?: No Home free of loose throw rugs in walkways, pet beds, electrical cords, etc?: Yes Adequate lighting in your home to reduce risk of falls?: Yes Life alert?: No Use of a cane, walker or w/c?: No Grab bars in the bathroom?: Yes Shower chair or bench in shower?: Yes Elevated toilet seat or a handicapped toilet?: No  TIMED UP AND GO:  Was the test performed?  No    Cognitive Function:        12/30/2022   10:11 AM 01/01/2022    9:56 AM  6CIT Screen  What Year? 0 points 0 points  What month? 0 points 0  points  What time? 0 points 0 points  Count back from 20 0 points 0 points  Months in reverse 0 points 0 points  Repeat phrase 0 points 0 points  Total Score 0 points 0 points    Immunizations Immunization History  Administered Date(s) Administered   Fluad Quad(high Dose 65+) 12/15/2018, 12/19/2019, 12/18/2020, 12/16/2021   Influenza Split 01/01/2012   Influenza Whole 12/27/2007, 12/06/2010   Influenza, High Dose Seasonal PF 01/11/2013, 12/07/2014, 12/06/2015, 12/02/2016, 12/05/2022   Influenza,inj,Quad PF,6+ Mos 12/28/2013   Influenza-Unspecified 12/01/2016, 12/28/2016, 12/15/2018   PFIZER Comirnaty(Gray Top)Covid-19 Tri-Sucrose Vaccine 09/27/2020   PFIZER(Purple Top)SARS-COV-2 Vaccination 04/21/2019, 05/11/2019, 01/10/2020   Pneumococcal Conjugate-13 04/06/2006, 10/11/2013   Pneumococcal Polysaccharide-23 04/28/2016   Tdap 01/29/2017   Zoster Recombinant(Shingrix) 10/26/2018, 10/26/2018, 12/30/2018, 12/30/2018   Zoster, Live 03/28/2007    TDAP status: Up to date  Flu Vaccine status: Up to date  Pneumococcal vaccine status: Up to date  Covid-19 vaccine status: Declined, Education has been provided regarding the importance of this vaccine but patient still declined. Advised may receive this vaccine at local pharmacy or Health Dept.or vaccine clinic. Aware to provide a copy of the vaccination record if obtained from local pharmacy or Health Dept. Verbalized acceptance and understanding.  Qualifies for Shingles Vaccine? Yes   Zostavax completed Yes   Shingrix Completed?: Yes  Screening Tests Health Maintenance  Topic Date Due   Diabetic kidney evaluation - Urine ACR  Never done   OPHTHALMOLOGY EXAM  11/15/2022   COVID-19 Vaccine (5 - 2023-24 season) 12/06/2022   FOOT EXAM  02/14/2023   HEMOGLOBIN A1C  03/17/2023   Diabetic kidney evaluation - eGFR measurement  09/15/2023   Medicare Annual Wellness (AWV)  12/30/2023   Colonoscopy  10/11/2024   DTaP/Tdap/Td (2 - Td or  Tdap) 01/30/2027   Pneumonia Vaccine 66+ Years old  Completed   INFLUENZA VACCINE  Completed   DEXA SCAN  Completed   Zoster Vaccines- Shingrix  Completed   HPV VACCINES  Aged Out    Health Maintenance  Health Maintenance Due  Topic Date Due   Diabetic kidney evaluation - Urine ACR  Never done   OPHTHALMOLOGY EXAM  11/15/2022   COVID-19 Vaccine (5 - 2023-24 season) 12/06/2022    Colorectal cancer screening: Type of screening: Colonoscopy. Completed 10/12/19. Repeat every 5 years   Bone Density status: Completed 12/22/22. Results reflect: Bone density results: OSTEOPENIA. Repeat every   years.    Additional Screening:    Vision Screening: Recommended annual ophthalmology exams for early detection of glaucoma and other disorders of the eye. Is the patient up to date with their annual eye exam?  Yes  Who is the provider or what is  the name of the office in which the patient attends annual eye exams? Dr Lorin Picket  If pt is not established with a provider, would they like to be referred to a provider to establish care? No .   Dental Screening: Recommended annual dental exams for proper oral hygiene    Community Resource Referral / Chronic Care Management:  CRR required this visit?  No   CCM required this visit?  No     Plan:     I have personally reviewed and noted the following in the patient's chart:   Medical and social history Use of alcohol, tobacco or illicit drugs  Current medications and supplements including opioid prescriptions. Patient is not currently taking opioid prescriptions. Functional ability and status Nutritional status Physical activity Advanced directives List of other physicians Hospitalizations, surgeries, and ER visits in previous 12 months Vitals Screenings to include cognitive, depression, and falls Referrals and appointments  In addition, I have reviewed and discussed with patient certain preventive protocols, quality metrics, and best  practice recommendations. A written personalized care plan for preventive services as well as general preventive health recommendations were provided to patient.     Tillie Rung, LPN   07/13/8117   After Visit Summary: (MyChart) Due to this being a telephonic visit, the after visit summary with patients personalized plan was offered to patient via MyChart   Nurse Notes: Patient due Diabetic Kidney Evaluation Urine ACR

## 2023-01-07 DIAGNOSIS — L570 Actinic keratosis: Secondary | ICD-10-CM | POA: Diagnosis not present

## 2023-01-07 DIAGNOSIS — L820 Inflamed seborrheic keratosis: Secondary | ICD-10-CM | POA: Diagnosis not present

## 2023-01-19 ENCOUNTER — Ambulatory Visit: Payer: Medicare Other

## 2023-01-19 ENCOUNTER — Ambulatory Visit: Payer: Medicare Other | Admitting: Family Medicine

## 2023-01-19 ENCOUNTER — Encounter: Payer: Self-pay | Admitting: Family Medicine

## 2023-01-19 VITALS — BP 120/70 | HR 64 | Temp 97.9°F | Ht 60.0 in | Wt 120.9 lb

## 2023-01-19 DIAGNOSIS — M79631 Pain in right forearm: Secondary | ICD-10-CM

## 2023-01-19 DIAGNOSIS — M8080XD Other osteoporosis with current pathological fracture, unspecified site, subsequent encounter for fracture with routine healing: Secondary | ICD-10-CM | POA: Diagnosis not present

## 2023-01-19 DIAGNOSIS — M810 Age-related osteoporosis without current pathological fracture: Secondary | ICD-10-CM | POA: Diagnosis not present

## 2023-01-19 NOTE — Patient Instructions (Signed)
Suspect muscular strain  X-rays look OK but will be over-read  Let me know if pain not resolving next few weeks.

## 2023-01-19 NOTE — Progress Notes (Signed)
Established Patient Office Visit  Subjective   Patient ID: [name], female    DOB: [birth year]  Age: 82 y.o. MRN: [id number]  Chief Complaint  Patient presents with   Arm Pain    HPI   [name] is seen with right forearm pain for the past couple weeks.  Location is mostly dorsal and proximal forearm.  Achy discomfort worse with gripping.  Denies any known injury.  Right hand dominant.  She has had carpal tunnel syndrome in the past but this pain is different.  No radiation into the hand.  No paresthesias.  No neck pain.  She has history of osteoporosis.  She had follow-up DEXA scan about a month ago showed T-score -2.9 left forearm.  Left femur was excluded secondary to history of hip replacement.  AP spine -2.0.  Right femur neck -0.9  Past Medical History:  Diagnosis Date   Broken femur (HCC)    Cancer (HCC) 2001   skin, nose   Cataract    Diabetes mellitus    Borderline/no meds   Diverticulosis of colon    DNR (do not resuscitate) 2009   GERD (gastroesophageal reflux disease)    Heart murmur    Hyperlipidemia    Hypertension    Kidney stones    1 time   Migraines    during menopause   Osteoporosis 09/2018   T score -2.7 stable from prior DEXA.   Pneumothorax on left    Past Surgical History:  Procedure Laterality Date   ABDOMINAL HYSTERECTOMY  1994   fibroids/ complete   BUNIONECTOMY  2004   left   CARPAL TUNNEL RELEASE  2000   right    CATARACT EXTRACTION     Bil   COLONOSCOPY  10/24/2013   FEMUR FRACTURE SURGERY     LUNG SURGERY  2008   left lung VATS for biopsy and removal of blebs with pleurodesis   OOPHORECTOMY     BSO   POLYPECTOMY     TONSILLECTOMY     as child    reports that she has never smoked. She has never used smokeless tobacco. She reports that she does not drink alcohol and does not use drugs. family history includes Cancer in her father, mother, and sister; Diabetes in her sister; Emphysema in her maternal aunt; Hyperlipidemia in  her sister; Ovarian cancer in her sister; Pulmonary embolism in her father. No Known Allergies   Review of Systems  Constitutional:  Negative for chills and fever.  Musculoskeletal:  Negative for neck pain.  Neurological:  Negative for focal weakness.      Objective:     BP 120/70 (BP Location: Left Arm, Patient Position: Sitting, Cuff Size: Normal)   Pulse 64   Temp 97.9 F (36.6 C) (Oral)   Ht 5' (1.524 m)   Wt 120 lb 14.4 oz (54.8 kg)   SpO2 95%   BMI 23.61 kg/m  BP Readings from Last 3 Encounters:  01/19/23 120/70  09/15/22 130/70  02/13/22 112/70   Wt Readings from Last 3 Encounters:  01/19/23 120 lb 14.4 oz (54.8 kg)  12/30/22 124 lb (56.2 kg)  09/15/22 124 lb 9.6 oz (56.5 kg)      Physical Exam Vitals reviewed.  Constitutional:      Appearance: Normal appearance.  Cardiovascular:     Rate and Rhythm: Normal rate and regular rhythm.  Pulmonary:     Effort: Pulmonary effort is normal.     Breath sounds: Normal breath  sounds.  Musculoskeletal:     Comments: Right forearm reveals no visible swelling or erythema.  No warmth.  Full range of motion elbow.  Does not have any localized tenderness over the medial or lateral epicondylar region.  No obvious bony tenderness.  Very mild tenderness proximal extensor muscles of the right forearm.  Good distal pulses.  No pain with wrist extension or flexion against resistance.  No distal biceps tenderness.  No triceps tenderness.  Neurological:     Mental Status: She is alert.      No results found for any visits on 01/19/23.    The ASCVD Risk score (Arnett DK, et al., 2019) failed to calculate for the following reasons:   The 2019 ASCVD risk score is only valid for ages 40 to 74    Assessment & Plan:   #1 nonspecific somewhat poorly localized right proximal forearm pain.  No evidence clinically to suggest lateral epicondylitis.  Suspect probably muscle strain but with history of severe osteoporosis discussed  getting x-rays.  Low clinical suspicion of fracture. X-ray reviewed with patient.  No obvious acute bony abnormality.  This will be over read by radiology.  Would recommend observation for now.  Avoid regular use of nonsteroidals given her age.  #2 osteoporosis with history of femur fracture on Fosamax.  Patient currently is on Climara patch.  Takes calcium and vitamin D.  Regular exercise.  Recent bone density results discussed with patient   No follow-ups on file.    Evelena Peat, MD

## 2023-02-12 ENCOUNTER — Telehealth: Payer: Self-pay | Admitting: Family Medicine

## 2023-02-12 NOTE — Telephone Encounter (Signed)
Call return

## 2023-02-12 NOTE — Telephone Encounter (Signed)
Please see result note 

## 2023-03-01 ENCOUNTER — Other Ambulatory Visit: Payer: Self-pay | Admitting: Family Medicine

## 2023-03-17 ENCOUNTER — Encounter: Payer: Self-pay | Admitting: Family Medicine

## 2023-03-17 ENCOUNTER — Ambulatory Visit: Payer: Medicare Other

## 2023-03-17 ENCOUNTER — Ambulatory Visit: Payer: Medicare Other | Admitting: Family Medicine

## 2023-03-17 VITALS — BP 126/64 | HR 65 | Temp 98.1°F | Ht 60.0 in | Wt 119.5 lb

## 2023-03-17 DIAGNOSIS — M47812 Spondylosis without myelopathy or radiculopathy, cervical region: Secondary | ICD-10-CM | POA: Diagnosis not present

## 2023-03-17 DIAGNOSIS — M5412 Radiculopathy, cervical region: Secondary | ICD-10-CM

## 2023-03-17 DIAGNOSIS — E119 Type 2 diabetes mellitus without complications: Secondary | ICD-10-CM

## 2023-03-17 DIAGNOSIS — I10 Essential (primary) hypertension: Secondary | ICD-10-CM | POA: Diagnosis not present

## 2023-03-17 DIAGNOSIS — M4802 Spinal stenosis, cervical region: Secondary | ICD-10-CM | POA: Diagnosis not present

## 2023-03-17 DIAGNOSIS — M542 Cervicalgia: Secondary | ICD-10-CM | POA: Diagnosis not present

## 2023-03-17 DIAGNOSIS — M79601 Pain in right arm: Secondary | ICD-10-CM | POA: Diagnosis not present

## 2023-03-17 LAB — POCT GLYCOSYLATED HEMOGLOBIN (HGB A1C): Hemoglobin A1C: 6.6 % — AB (ref 4.0–5.6)

## 2023-03-17 NOTE — Progress Notes (Signed)
Established Patient Office Visit  Subjective   Patient ID: [name], female    DOB: 08-05-1940  Age: 82 y.o. MRN: [id number]  No chief complaint on file.   HPI   [name] is seen today for medical follow-up.  She has history of hypertension, type 2 diabetes, GERD, osteoporosis, hyperlipidemia.  She has had prior spontaneous left femur fracture which was felt to be probably related to bisphosphonates.  Currently does some walking but not regularly.  Denies any recent falls.  Last A1c was 6.7% which is slightly up for her.  She has been able to manage this without medication.  She remains on simvastatin for hyperlipidemia and takes lisinopril for hypertension.  Her blood pressures been very stable.  No recent dizziness.  She is having ongoing right upper extremity pain.  She was seen couple months ago and had x-rays which showed no acute bony abnormalities.  Pain initially seem to be mostly elbow region but since then seems to generalize somewhat more proximally.  Occasionally radiates toward the right side of the neck.  Denies any extreme weakness or significant paresthesias.  Sometimes pain is worse with gripping.  Did not have any localized tenderness around the lateral epicondylar region previously.  She was concerned because her mother had some type of bone cancer.  She is concerned this may be related to neck etiology based on her recent right neck pain and radiation down the right upper extremity  Past Medical History:  Diagnosis Date   Broken femur (HCC)    Cancer (HCC) 2001   skin, nose   Cataract    Diabetes mellitus    Borderline/no meds   Diverticulosis of colon    DNR (do not resuscitate) 2009   GERD (gastroesophageal reflux disease)    Heart murmur    Hyperlipidemia    Hypertension    Kidney stones    1 time   Migraines    during menopause   Osteoporosis 09/2018   T score -2.7 stable from prior DEXA.   Pneumothorax on left    Past Surgical History:  Procedure  Laterality Date   ABDOMINAL HYSTERECTOMY  1994   fibroids/ complete   BUNIONECTOMY  2004   left   CARPAL TUNNEL RELEASE  2000   right    CATARACT EXTRACTION     Bil   COLONOSCOPY  10/24/2013   FEMUR FRACTURE SURGERY     LUNG SURGERY  2008   left lung VATS for biopsy and removal of blebs with pleurodesis   OOPHORECTOMY     BSO   POLYPECTOMY     TONSILLECTOMY     as child    reports that she has never smoked. She has never used smokeless tobacco. She reports that she does not drink alcohol and does not use drugs. family history includes Cancer in her father, mother, and sister; Diabetes in her sister; Emphysema in her maternal aunt; Hyperlipidemia in her sister; Ovarian cancer in her sister; Pulmonary embolism in her father. No Known Allergies  Review of Systems  Constitutional:  Negative for malaise/fatigue.  Eyes:  Negative for blurred vision.  Respiratory:  Negative for shortness of breath.   Cardiovascular:  Negative for chest pain.  Musculoskeletal:  Positive for neck pain.  Neurological:  Negative for dizziness, sensory change, weakness and headaches.      Objective:     BP 126/64 (BP Location: Left Arm, Patient Position: Sitting, Cuff Size: Normal)   Pulse 65   Temp  98.1 F (36.7 C) (Oral)   Ht 5' (1.524 m)   Wt 119 lb 8 oz (54.2 kg)   SpO2 95%   BMI 23.34 kg/m  BP Readings from Last 3 Encounters:  03/17/23 126/64  01/19/23 120/70  09/15/22 130/70   Wt Readings from Last 3 Encounters:  03/17/23 119 lb 8 oz (54.2 kg)  01/19/23 120 lb 14.4 oz (54.8 kg)  12/30/22 124 lb (56.2 kg)      Physical Exam Vitals reviewed.  Constitutional:      General: She is not in acute distress.    Appearance: She is not ill-appearing.  Cardiovascular:     Rate and Rhythm: Normal rate and regular rhythm.  Pulmonary:     Effort: Pulmonary effort is normal.     Breath sounds: Normal breath sounds. No wheezing or rales.  Musculoskeletal:     Cervical back: Neck supple.      Comments: Elbow reveals full range of motion.  No localized tenderness around the right elbow region.  No visible swelling.  No erythema.  No warmth.  Good range of motion right shoulder.  Neurological:     Mental Status: She is alert.     Comments: Full strength right upper extremity.      Results for orders placed or performed in visit on 03/17/23  POC HgB A1c  Result Value Ref Range   Hemoglobin A1C 6.6 (A) 4.0 - 5.6 %   HbA1c POC (<> result, manual entry)     HbA1c, POC (prediabetic range)     HbA1c, POC (controlled diabetic range)      Last CBC Lab Results  Component Value Date   WBC 7.2 08/14/2019   HGB 13.6 08/14/2019   HCT 40.0 08/14/2019   MCV 97.3 08/14/2019   MCH 32.8 07/25/2012   RDW 13.6 08/14/2019   PLT 273.0 08/14/2019   Last metabolic panel Lab Results  Component Value Date   GLUCOSE 140 (H) 09/15/2022   NA 133 (L) 09/15/2022   K 4.4 09/15/2022   CL 99 09/15/2022   CO2 29 09/15/2022   BUN 14 09/15/2022   CREATININE 0.72 09/15/2022   GFR 78.16 09/15/2022   CALCIUM 9.7 09/15/2022   PROT 7.0 09/15/2022   ALBUMIN 4.6 09/15/2022   BILITOT 0.7 09/15/2022   ALKPHOS 42 09/15/2022   AST 21 09/15/2022   ALT 22 09/15/2022   Last lipids Lab Results  Component Value Date   CHOL 153 09/15/2022   HDL 61.60 09/15/2022   LDLCALC 62 09/15/2022   LDLDIRECT 73.0 08/12/2021   TRIG 145.0 09/15/2022   CHOLHDL 2 09/15/2022   Last hemoglobin A1c Lab Results  Component Value Date   HGBA1C 6.6 (A) 03/17/2023      The ASCVD Risk score (Arnett DK, et al., 2019) failed to calculate for the following reasons:   The 2019 ASCVD risk score is only valid for ages 87 to 43    Assessment & Plan:   #1 type 2 diabetes controlled with A1c 6.6% today.  Continue low glycemic diet and try establish more consistent exercise.  Set up 28-month follow-up  #2 hypertension stable and at goal on lisinopril 10 mg daily.  Continue low-sodium diet  #3 right upper extremity  pain.  Occasional right cervical neck pain.  No evidence to suggest lateral epicondylitis or carpal tunnel.  Question cervical nerve root impingement.  Has had several months of pain.  Recent plain films right elbow unremarkable.  Obtain cervical spine films. Might need  MRI cervical spine to further assess.   Return in about 6 months (around 09/15/2023).    Evelena Peat, MD

## 2023-03-17 NOTE — Patient Instructions (Signed)
A1C today is slightly improved at 6.6%.

## 2023-03-29 NOTE — Addendum Note (Signed)
Addended by: Kristian Covey on: 03/29/2023 12:36 PM   Modules accepted: Orders

## 2023-04-16 ENCOUNTER — Other Ambulatory Visit: Payer: Medicare Other

## 2023-04-25 ENCOUNTER — Ambulatory Visit
Admission: RE | Admit: 2023-04-25 | Discharge: 2023-04-25 | Disposition: A | Discharge status: Home / Self Care | Payer: Medicare Other | Source: Ambulatory Visit | Attending: Family Medicine | Admitting: Family Medicine

## 2023-04-25 DIAGNOSIS — G8929 Other chronic pain: Secondary | ICD-10-CM | POA: Diagnosis not present

## 2023-04-25 DIAGNOSIS — M5412 Radiculopathy, cervical region: Secondary | ICD-10-CM

## 2023-04-25 DIAGNOSIS — M542 Cervicalgia: Secondary | ICD-10-CM | POA: Diagnosis not present

## 2023-05-05 ENCOUNTER — Encounter: Payer: Self-pay | Admitting: *Deleted

## 2023-05-05 NOTE — Addendum Note (Signed)
Addended by: Christy Sartorius on: 05/05/2023 03:39 PM   Modules accepted: Orders

## 2023-05-14 ENCOUNTER — Telehealth: Payer: Self-pay | Admitting: Family Medicine

## 2023-05-14 NOTE — Telephone Encounter (Signed)
 Copied from CRM [id number]. Topic: Referral - Status >> May 14, 2023 10:19 AM Eleanore Grey wrote: Reason for CRM: Patient has not heard anything regarding neurosurgery referral and she wanted to follow up.

## 2023-05-14 NOTE — Telephone Encounter (Signed)
 Spoke with [facility] schedulers and they informed me that they have started working on patient referral.  Left a voicemail to receive a call back to get more information.

## 2023-05-14 NOTE — Telephone Encounter (Signed)
 Patient informed of message below.

## 2023-05-24 DIAGNOSIS — M5412 Radiculopathy, cervical region: Secondary | ICD-10-CM | POA: Diagnosis not present

## 2023-05-24 DIAGNOSIS — M47812 Spondylosis without myelopathy or radiculopathy, cervical region: Secondary | ICD-10-CM | POA: Diagnosis not present

## 2023-07-07 DIAGNOSIS — D485 Neoplasm of uncertain behavior of skin: Secondary | ICD-10-CM | POA: Diagnosis not present

## 2023-07-07 DIAGNOSIS — L821 Other seborrheic keratosis: Secondary | ICD-10-CM | POA: Diagnosis not present

## 2023-07-07 DIAGNOSIS — R229 Localized swelling, mass and lump, unspecified: Secondary | ICD-10-CM | POA: Diagnosis not present

## 2023-07-07 DIAGNOSIS — L570 Actinic keratosis: Secondary | ICD-10-CM | POA: Diagnosis not present

## 2023-07-07 DIAGNOSIS — Z85828 Personal history of other malignant neoplasm of skin: Secondary | ICD-10-CM | POA: Diagnosis not present

## 2023-07-07 DIAGNOSIS — D225 Melanocytic nevi of trunk: Secondary | ICD-10-CM | POA: Diagnosis not present

## 2023-07-07 DIAGNOSIS — L814 Other melanin hyperpigmentation: Secondary | ICD-10-CM | POA: Diagnosis not present

## 2023-07-07 DIAGNOSIS — C44722 Squamous cell carcinoma of skin of right lower limb, including hip: Secondary | ICD-10-CM | POA: Diagnosis not present

## 2023-07-07 DIAGNOSIS — Z08 Encounter for follow-up examination after completed treatment for malignant neoplasm: Secondary | ICD-10-CM | POA: Diagnosis not present

## 2023-08-16 ENCOUNTER — Telehealth: Payer: Self-pay

## 2023-08-16 NOTE — Telephone Encounter (Signed)
 Copied from CRM [id number]. Topic: General - Other >> Aug 16, 2023  2:49 PM Annelle Kiel wrote: Reason for CRM: libra freestyle reader and censor and last two visit notes as well [facility]      [phone]

## 2023-08-17 DIAGNOSIS — Z08 Encounter for follow-up examination after completed treatment for malignant neoplasm: Secondary | ICD-10-CM | POA: Diagnosis not present

## 2023-08-17 DIAGNOSIS — L570 Actinic keratosis: Secondary | ICD-10-CM | POA: Diagnosis not present

## 2023-08-17 DIAGNOSIS — D2339 Other benign neoplasm of skin of other parts of face: Secondary | ICD-10-CM | POA: Diagnosis not present

## 2023-08-17 DIAGNOSIS — Z85828 Personal history of other malignant neoplasm of skin: Secondary | ICD-10-CM | POA: Diagnosis not present

## 2023-08-17 DIAGNOSIS — Z872 Personal history of diseases of the skin and subcutaneous tissue: Secondary | ICD-10-CM | POA: Diagnosis not present

## 2023-08-17 DIAGNOSIS — L578 Other skin changes due to chronic exposure to nonionizing radiation: Secondary | ICD-10-CM | POA: Diagnosis not present

## 2023-08-17 MED ORDER — FREESTYLE LIBRE 3 PLUS SENSOR MISC: 2 refills | Status: DC

## 2023-08-17 NOTE — Telephone Encounter (Signed)
 Faxed

## 2023-08-17 NOTE — Addendum Note (Signed)
 Addended by: Aurelio Leer on: 08/17/2023 08:05 AM   Modules accepted: Orders

## 2023-08-23 ENCOUNTER — Other Ambulatory Visit: Payer: Self-pay | Admitting: Family Medicine

## 2023-08-23 DIAGNOSIS — I10 Essential (primary) hypertension: Secondary | ICD-10-CM

## 2023-09-01 ENCOUNTER — Other Ambulatory Visit: Payer: Self-pay | Admitting: Family Medicine

## 2023-09-14 ENCOUNTER — Telehealth: Payer: Self-pay

## 2023-09-14 NOTE — Telephone Encounter (Signed)
 Copied from CRM [id number]. Topic: General - Other >> Sep 14, 2023  9:20 AM Emmet Harm C wrote: Reason for CRM: [facility] supply sent over chart notes from the visit 03/17/23 and they need the most recent chart notes from office visit fax over [phone]  Chart notes need to be within the last 6 months >> Sep 14, 2023  9:27 AM Emmet Harm C wrote: Fax over appointment notes from appointment 09/15/2023 after

## 2023-09-14 NOTE — Telephone Encounter (Signed)
 Patient has appt tomorrow and forms will be discussed during visit and faxed accordingly

## 2023-09-15 ENCOUNTER — Ambulatory Visit (INDEPENDENT_AMBULATORY_CARE_PROVIDER_SITE_OTHER): Payer: Medicare Other | Admitting: Family Medicine

## 2023-09-15 DIAGNOSIS — E119 Type 2 diabetes mellitus without complications: Secondary | ICD-10-CM

## 2023-09-15 DIAGNOSIS — R634 Abnormal weight loss: Secondary | ICD-10-CM | POA: Diagnosis not present

## 2023-09-15 DIAGNOSIS — I10 Essential (primary) hypertension: Secondary | ICD-10-CM | POA: Diagnosis not present

## 2023-09-15 DIAGNOSIS — E785 Hyperlipidemia, unspecified: Secondary | ICD-10-CM | POA: Diagnosis not present

## 2023-09-15 LAB — POCT GLYCOSYLATED HEMOGLOBIN (HGB A1C): Hemoglobin A1C: 6.4 % — AB (ref 4.0–5.6)

## 2023-09-15 MED ORDER — ESTRADIOL 0.075 MG/24HR TD PTWK: 0.0750 mg | MEDICATED_PATCH | TRANSDERMAL | 6 refills | Status: DC

## 2023-09-15 NOTE — Progress Notes (Signed)
 Established Patient Office Visit  Subjective   Patient ID: [name], female    DOB: 1941-01-26  Age: 83 y.o. MRN: [id number]  Chief Complaint  Patient presents with   Medical Management of Chronic Issues    HPI   [name] is seen today for routine 10-month medical follow-up.  She has history of hypertension, migraine headaches, hyperlipidemia, GERD, type 2 diabetes, osteoporosis.  She states she has a younger sister who lives in [place], [name]  who had cardiac surgery and required 4 stents in her heart earlier this year.  Taylor. Howard has had some gradual weight loss of 124 pounds a year ago to 115 today.  She states she has good appetite and no major dietary changes.  Her blood sugars have been stable.  Denies any abdominal pain.  No change in stool habits.  Medications include simvastatin , lisinopril , [place]  patch, and over-the-counter supplements.  She has managed to treat her diabetes for several years without medication.  She has had past history of spontaneous femur fracture when she was on Fosamax .  Past Medical History:  Diagnosis Date   Broken femur (HCC)    Cancer (HCC) 2001   skin, nose   Cataract    Diabetes mellitus    Borderline/no meds   Diverticulosis of colon    DNR (do not resuscitate) 2009   GERD (gastroesophageal reflux disease)    Heart murmur    Hyperlipidemia    Hypertension    Kidney stones    1 time   Migraines    during menopause   Osteoporosis 09/2018   T score -2.7 stable from prior DEXA.   Pneumothorax on left    Past Surgical History:  Procedure Laterality Date   ABDOMINAL HYSTERECTOMY  1994   fibroids/ complete   BUNIONECTOMY  2004   left   CARPAL TUNNEL RELEASE  2000   right    CATARACT EXTRACTION     Bil   COLONOSCOPY  10/24/2013   FEMUR FRACTURE SURGERY     LUNG SURGERY  2008   left lung VATS for biopsy and removal of blebs with pleurodesis   OOPHORECTOMY     BSO   POLYPECTOMY     TONSILLECTOMY     as child     reports that she has never smoked. She has never used smokeless tobacco. She reports that she does not drink alcohol  and does not use drugs. family history includes Cancer in her father, mother, and sister; Diabetes in her sister; Emphysema in her maternal aunt; Hyperlipidemia in her sister; Ovarian cancer in her sister; Pulmonary embolism in her father. No Known Allergies  Review of Systems  Constitutional:  Negative for malaise/fatigue.  Eyes:  Negative for blurred vision.  Respiratory:  Negative for shortness of breath.   Cardiovascular:  Negative for chest pain.  Gastrointestinal:  Negative for abdominal pain, blood in stool, diarrhea and melena.  Neurological:  Negative for dizziness, weakness and headaches.      Objective:      There were no vitals taken for this visit. BP Readings from Last 3 Encounters:  03/17/23 126/64  01/19/23 120/70  09/15/22 130/70   Wt Readings from Last 3 Encounters:  03/17/23 119 lb 8 oz (54.2 kg)  01/19/23 120 lb 14.4 oz (54.8 kg)  12/30/22 124 lb (56.2 kg)      Physical Exam Vitals reviewed.  Constitutional:      General: She is not in acute distress.    Appearance: She  is not ill-appearing.  Cardiovascular:     Rate and Rhythm: Normal rate and regular rhythm.  Pulmonary:     Effort: Pulmonary effort is normal.     Breath sounds: Normal breath sounds. No wheezing or rales.  Musculoskeletal:     Right lower leg: No edema.     Left lower leg: No edema.  Neurological:     Mental Status: She is alert.      Results for orders placed or performed in visit on 09/15/23  POC HgB A1c  Result Value Ref Range   Hemoglobin A1C 6.4 (A) 4.0 - 5.6 %   HbA1c POC (<> result, manual entry)     HbA1c, POC (prediabetic range)     HbA1c, POC (controlled diabetic range)        The ASCVD Risk score (Arnett DK, et al., 2019) failed to calculate for the following reasons:   The 2019 ASCVD risk score is only valid for ages 34 to 77     Assessment & Plan:   Problem List Items Addressed This Visit       Unprioritized   Type 2 diabetes mellitus, controlled (HCC) - Primary   Relevant Orders   POC HgB A1c (Completed)   Microalbumin / creatinine urine ratio   Hyperlipidemia   Relevant Orders   Lipid panel   CMP   Essential hypertension   Other Visit Diagnoses       Weight loss       Relevant Orders   TSH   CBC with Differential/Platelet     Chronic medical problems as above.  Her diabetes is well-controlled A1c 6.4%.  Blood pressure slightly high on admission here today but came down slightly with rest.  Recommend close monitoring.  She has had some modest weight loss over the past year but good appetite.  Similar activity levels.  Check CBC and TSH in addition to labs above.  Recommend periodic monitoring of weight at least monthly to make sure no significant changes.  Return in about 6 months (around 03/16/2024).    Glean Lamy, MD

## 2023-09-16 ENCOUNTER — Ambulatory Visit: Payer: Self-pay | Admitting: Family Medicine

## 2023-09-16 LAB — COMPREHENSIVE METABOLIC PANEL WITH GFR
AG Ratio: 2.2 (calc) (ref 1.0–2.5)
ALT: 14 U/L (ref 6–29)
AST: 17 U/L (ref 10–35)
Albumin: 4.4 g/dL (ref 3.6–5.1)
Alkaline phosphatase (APISO): 44 U/L (ref 37–153)
BUN: 13 mg/dL (ref 7–25)
CO2: 27 mmol/L (ref 20–32)
Calcium: 10.2 mg/dL (ref 8.6–10.4)
Chloride: 102 mmol/L (ref 98–110)
Creat: 0.61 mg/dL (ref 0.60–0.95)
Globulin: 2 g/dL (ref 1.9–3.7)
Glucose, Bld: 82 mg/dL (ref 65–99)
Potassium: 4.5 mmol/L (ref 3.5–5.3)
Sodium: 137 mmol/L (ref 135–146)
Total Bilirubin: 0.7 mg/dL (ref 0.2–1.2)
Total Protein: 6.4 g/dL (ref 6.1–8.1)
eGFR: 89 mL/min/{1.73_m2} (ref >=60)

## 2023-09-16 LAB — CBC WITH DIFFERENTIAL/PLATELET
Absolute Lymphocytes: 1643 {cells}/uL (ref 850–3900)
Absolute Monocytes: 577 {cells}/uL (ref 200–950)
Basophils Absolute: 73 {cells}/uL (ref 0–200)
Basophils Relative: 1 %
Eosinophils Absolute: 110 {cells}/uL (ref 15–500)
Eosinophils Relative: 1.5 %
HCT: 41.7 % (ref 35.0–45.0)
Hemoglobin: 14.2 g/dL (ref 11.7–15.5)
MCH: 32.9 pg (ref 27.0–33.0)
MCHC: 34.1 g/dL (ref 32.0–36.0)
MCV: 96.5 fL (ref 80.0–100.0)
MPV: 11.9 fL (ref 7.5–12.5)
Monocytes Relative: 7.9 %
Neutro Abs: 4898 {cells}/uL (ref 1500–7800)
Neutrophils Relative %: 67.1 %
Platelets: 277 10*3/uL (ref 140–400)
RBC: 4.32 10*6/uL (ref 3.80–5.10)
RDW: 12.4 % (ref 11.0–15.0)
Total Lymphocyte: 22.5 %
WBC: 7.3 10*3/uL (ref 3.8–10.8)

## 2023-09-16 LAB — LIPID PANEL
Cholesterol: 157 mg/dL (ref <200)
HDL: 62 mg/dL (ref >=50)
LDL Cholesterol (Calc): 73 mg/dL
Non-HDL Cholesterol (Calc): 95 mg/dL (ref <130)
Total CHOL/HDL Ratio: 2.5 (calc) (ref <5.0)
Triglycerides: 139 mg/dL (ref <150)

## 2023-09-16 LAB — MICROALBUMIN / CREATININE URINE RATIO
Creatinine, Urine: 26 mg/dL (ref 20–275)
Microalb, Ur: <0.2 mg/dL

## 2023-09-16 LAB — TSH: TSH: 1.78 m[IU]/L (ref 0.40–4.50)

## 2023-09-17 ENCOUNTER — Other Ambulatory Visit: Payer: Self-pay | Admitting: Family Medicine

## 2023-09-17 NOTE — Telephone Encounter (Signed)
 Copied from CRM [id number]. Topic: General - Other >> Sep 17, 2023  4:49 PM Tiffany S wrote: Reason for CRM: [facility] sent fax requesting test strips please follow up  [phone] glucose blood test strip [[id number]]

## 2023-09-20 MED ORDER — GLUCOSE BLOOD VI STRP: ORAL_STRIP | 3 refills | Status: AC

## 2023-09-28 ENCOUNTER — Other Ambulatory Visit: Payer: Self-pay | Admitting: Family Medicine

## 2023-09-28 DIAGNOSIS — Z1231 Encounter for screening mammogram for malignant neoplasm of breast: Secondary | ICD-10-CM

## 2023-11-02 ENCOUNTER — Ambulatory Visit
Admission: RE | Admit: 2023-11-02 | Discharge: 2023-11-02 | Disposition: A | Discharge status: Home / Self Care | Source: Ambulatory Visit | Attending: Family Medicine | Admitting: Family Medicine

## 2023-11-02 DIAGNOSIS — Z1231 Encounter for screening mammogram for malignant neoplasm of breast: Secondary | ICD-10-CM

## 2023-11-27 ENCOUNTER — Other Ambulatory Visit: Payer: Self-pay | Admitting: Family Medicine

## 2023-12-02 DIAGNOSIS — Z23 Encounter for immunization: Secondary | ICD-10-CM | POA: Diagnosis not present

## 2023-12-02 DIAGNOSIS — H04123 Dry eye syndrome of bilateral lacrimal glands: Secondary | ICD-10-CM | POA: Diagnosis not present

## 2023-12-02 LAB — HM DIABETES EYE EXAM

## 2023-12-14 ENCOUNTER — Encounter: Payer: Self-pay | Admitting: Family Medicine

## 2023-12-16 DIAGNOSIS — L570 Actinic keratosis: Secondary | ICD-10-CM | POA: Diagnosis not present

## 2023-12-16 DIAGNOSIS — D485 Neoplasm of uncertain behavior of skin: Secondary | ICD-10-CM | POA: Diagnosis not present

## 2023-12-16 DIAGNOSIS — C44311 Basal cell carcinoma of skin of nose: Secondary | ICD-10-CM | POA: Diagnosis not present

## 2024-01-03 ENCOUNTER — Ambulatory Visit: Payer: Medicare Other

## 2024-01-03 VITALS — Ht 60.0 in | Wt 119.0 lb

## 2024-01-03 DIAGNOSIS — Z0000 Encounter for general adult medical examination without abnormal findings: Secondary | ICD-10-CM

## 2024-01-03 NOTE — Patient Instructions (Addendum)
 [name],  Thank you for taking the time for your Medicare Wellness Visit. I appreciate your continued commitment to your health goals. Please review the care plan we discussed, and feel free to reach out if I can assist you further.  Medicare recommends these wellness visits once per year to help you and your care team stay ahead of potential health issues. These visits are designed to focus on prevention, allowing your provider to concentrate on managing your acute and chronic conditions during your regular appointments.  Please note that Annual Wellness Visits do not include a physical exam. Some assessments may be limited, especially if the visit was conducted virtually. If needed, we may recommend a separate in-person follow-up with your provider.  Ongoing Care Seeing your primary care provider every 3 to 6 months helps us  monitor your health and provide consistent, personalized care.   Referrals If a referral was made during today's visit and you haven't received any updates within two weeks, please contact the referred provider directly to check on the status.  Recommended Screenings:  Health Maintenance  Topic Date Due   Complete foot exam   02/14/2023   COVID-19 Vaccine (5 - 2025-26 season) 12/06/2023   Hemoglobin A1C  03/16/2024   Yearly kidney function blood test for diabetes  09/14/2024   Yearly kidney health urinalysis for diabetes  09/14/2024   Colon Cancer Screening  10/11/2024   Eye exam for diabetics  12/01/2024   Medicare Annual Wellness Visit  01/02/2025   DTaP/Tdap/Td vaccine (2 - Td or Tdap) 01/30/2027   Pneumococcal Vaccine for age over 11  Completed   Flu Shot  Completed   DEXA scan (bone density measurement)  Completed   Zoster (Shingles) Vaccine  Completed   HPV Vaccine  Aged Out   Meningitis B Vaccine  Aged Out       01/03/2024    9:30 AM  Advanced Directives  Does Patient Have a Medical Advance Directive? Yes  Type of Engineer, mining of [place];Living will  Does patient want to make changes to medical advance directive? No - Patient declined  Copy of Healthcare Power of Attorney in Chart? Yes - validated most recent copy scanned in chart (See row information)   Advance Care Planning is important because it: Ensures you receive medical care that aligns with your values, goals, and preferences. Provides guidance to your family and loved ones, reducing the emotional burden of decision-making during critical moments.  Vision: Annual vision screenings are recommended for early detection of glaucoma, cataracts, and diabetic retinopathy. These exams can also reveal signs of chronic conditions such as diabetes and high blood pressure.  Dental: Annual dental screenings help detect early signs of oral cancer, gum disease, and other conditions linked to overall health, including heart disease and diabetes.  Please see the attached documents for additional preventive care recommendations.

## 2024-01-03 NOTE — Progress Notes (Signed)
 Subjective:   [name] is a 83 y.o. who presents for a Medicare Wellness preventive visit.  As a reminder, Annual Wellness Visits don't include a physical exam, and some assessments may be limited, especially if this visit is performed virtually. We may recommend an in-person follow-up visit with your provider if needed.  Visit Complete: Virtual I connected with  Slater CHRISTELLA Alexander on 01/03/24 by a audio enabled telemedicine application and verified that I am speaking with the correct person using two identifiers.  Patient Location: Home  Provider Location: Home Office  I discussed the limitations of evaluation and management by telemedicine. The patient expressed understanding and agreed to proceed.  Vital Signs: Because this visit was a virtual/telehealth visit, some criteria may be missing or patient reported. Any vitals not documented were not able to be obtained and vitals that have been documented are patient reported.    Persons Participating in Visit: Patient.  AWV Questionnaire: No: Patient Medicare AWV questionnaire was not completed prior to this visit.  Cardiac Risk Factors include: advanced age (>58men, >81 women);diabetes mellitus;hypertension     Objective:    Today's Vitals   01/03/24 0924  Weight: 119 lb (54 kg)  Height: 5' (1.524 m)   Body mass index is 23.24 kg/m.     01/03/2024    9:30 AM 12/30/2022   10:11 AM 01/01/2022    9:56 AM 12/31/2020   10:42 AM 12/18/2019    1:25 PM 12/02/2016   11:27 AM  Advanced Directives  Does Patient Have a Medical Advance Directive? Yes Yes Yes Yes Yes Yes   Type of Estate agent of [place];Living will Healthcare Power of [place];Living will Healthcare Power of [place];Living will Healthcare Power of [place];Living will Healthcare Power of [place];Living will   Does patient want to make changes to medical advance directive? No - Patient declined No - Patient declined No - Patient declined  No -  Patient declined   Copy of Healthcare Power of Attorney in Chart? Yes - validated most recent copy scanned in chart (See row information) Yes - validated most recent copy scanned in chart (See row information) Yes - validated most recent copy scanned in chart (See row information) Yes - validated most recent copy scanned in chart (See row information) Yes - validated most recent copy scanned in chart (See row information)      Data saved with a previous flowsheet row definition    Current Medications (verified) Outpatient Encounter Medications as of 01/03/2024  Medication Sig   APPLE CIDER VINEGAR PO Take 900 mg by mouth daily.    Calcium  Citrate-Vitamin D  (CALCIUM  CITRATE + D3 PO) 1200mg  calcium  2000 IU Vit. D. Take by mouth daily.   Chromium-Cinnamon (CINNAMON PLUS CHROMIUM PO) 1000 mg cinnamon 100 mcg Chromium Take by mouth daily.   Continuous Glucose Sensor (FREESTYLE LIBRE 3 PLUS SENSOR) MISC Use as directed to check blood sugar daily Change sensor every 15 days.   cycloSPORINE  (RESTASIS ) 0.05 % ophthalmic emulsion Place 1 drop into both eyes 2 (two) times daily.   estradiol  (CLIMARA  - DOSED IN MG/24 HR) 0.075 mg/24hr patch Place 1 patch (0.075 mg total) onto the skin once a week.   glucose blood test strip Use as instructed tid to check blood sugars.   lisinopril  (ZESTRIL ) 10 MG tablet TAKE 1 TABLET DAILY   MAGNESIUM PO Take 250 mg by mouth daily.   Multiple Vitamin (MULTIVITAMIN PO) Take 1 tablet by mouth daily.   OVER THE  COUNTER MEDICATION Omega - 3 1040 mg take one tab daily   simvastatin  (ZOCOR ) 20 MG tablet TAKE 1 TABLET DAILY   Ultilet Classic Lancets MISC Testing BS three times daily   No facility-administered encounter medications on file as of 01/03/2024.    Allergies (verified) Patient has no known allergies.   History: Past Medical History:  Diagnosis Date   Broken femur (HCC)    Cancer (HCC) 2001   skin, nose   Cataract    Diabetes mellitus    Borderline/no  meds   Diverticulosis of colon    DNR (do not resuscitate) 2009   GERD (gastroesophageal reflux disease)    Heart murmur    Hyperlipidemia    Hypertension    Kidney stones    1 time   Migraines    during menopause   Osteoporosis 09/2018   T score -2.7 stable from prior DEXA.   Pneumothorax on left    Past Surgical History:  Procedure Laterality Date   ABDOMINAL HYSTERECTOMY  1994   fibroids/ complete   BUNIONECTOMY  2004   left   CARPAL TUNNEL RELEASE  2000   right    CATARACT EXTRACTION     Bil   COLONOSCOPY  10/24/2013   FEMUR FRACTURE SURGERY     LUNG SURGERY  2008   left lung VATS for biopsy and removal of blebs with pleurodesis   OOPHORECTOMY     BSO   POLYPECTOMY     TONSILLECTOMY     as child   Family History  Problem Relation Age of Onset   Cancer Mother        bone   Diabetes Sister    Hyperlipidemia Sister    Ovarian cancer Sister    Pulmonary embolism Father    Cancer Father        Facial   Cancer Sister        melanoma   Emphysema Maternal Aunt    Breast cancer Neg Hx    Colon cancer Neg Hx    Colon polyps Neg Hx    Esophageal cancer Neg Hx    Rectal cancer Neg Hx    Stomach cancer Neg Hx    Social History   Socioeconomic History   Marital status: Divorced    Spouse name: Not on file   Number of children: Not on file   Years of education: Not on file   Highest education level: Not on file  Occupational History   Not on file  Tobacco Use   Smoking status: Never   Smokeless tobacco: Never  Vaping Use   Vaping status: Never Used  Substance and Sexual Activity   Alcohol  use: No    Alcohol /week: 0.0 standard drinks of alcohol    Drug use: No   Sexual activity: Not Currently    Comment: 1st intercourse 83 yo-Fewer than 5 partners  Other Topics Concern   Not on file  Social History Narrative   Not on file   Social Drivers of Health   Financial Resource Strain: Low Risk  (01/03/2024)   Overall Financial Resource Strain (CARDIA)     Difficulty of Paying Living Expenses: Not hard at all  Food Insecurity: No Food Insecurity (01/03/2024)   Hunger Vital Sign    Worried About Running Out of Food in the Last Year: Never true    Ran Out of Food in the Last Year: Never true  Transportation Needs: No Transportation Needs (01/03/2024)   PRAPARE - Transportation  Lack of Transportation (Medical): No    Lack of Transportation (Non-Medical): No  Physical Activity: Inactive (01/03/2024)   Exercise Vital Sign    Days of Exercise per Week: 0 days    Minutes of Exercise per Session: 0 min  Stress: No Stress Concern Present (01/03/2024)   [employer] of Occupational Health - Occupational Stress Questionnaire    Feeling of Stress: Not at all  Social Connections: Moderately Integrated (01/03/2024)   Social Connection and Isolation Panel    Frequency of Communication with Friends and Family: More than three times a week    Frequency of Social Gatherings with Friends and Family: More than three times a week    Attends Religious Services: More than 4 times per year    Active Member of [employer] or Organizations: Yes    Attends Engineer, structural: More than 4 times per year    Marital Status: Divorced    Tobacco Counseling Counseling given: Not Answered    Clinical Intake:  Pre-visit preparation completed: Yes  Pain : No/denies pain     BMI - recorded: 23.24 Nutritional Status: BMI of 19-24  Normal Nutritional Risks: None Diabetes: Yes CBG done?: Yes (CBG 113 Per patient) CBG resulted in Enter/ Edit results?: Yes Did pt. bring in CBG monitor from home?: No  Lab Results  Component Value Date   HGBA1C 6.4 (A) 09/15/2023   HGBA1C 6.6 (A) 03/17/2023   HGBA1C 6.7 (A) 09/15/2022     How often do you need to have someone help you when you read instructions, pamphlets, or other written materials from your doctor or pharmacy?: 1 - Never  Interpreter Needed?: No  Information entered by :: Rojelio Blush  LPN   Activities of Daily Living     01/03/2024    9:29 AM  In your present state of health, do you have any difficulty performing the following activities:  Hearing? 0  Vision? 0  Difficulty concentrating or making decisions? 0  Walking or climbing stairs? 0  Dressing or bathing? 0  Doing errands, shopping? 0  Preparing Food and eating ? N  Using the Toilet? N  In the past six months, have you accidently leaked urine? Y  Comment Followed by Medical attention  Do you have problems with loss of bowel control? N  Managing your Medications? N  Managing your Finances? N  Housekeeping or managing your Housekeeping? N    Patient Care Team: Micheal Wolm ORN, MD as PCP - General (Family Medicine) Liane Sharyne MATSU, [facility] (Inactive) as Pharmacist (Pharmacist)  I have updated your Care Teams any recent Medical Services you may have received from other providers in the past year.     Assessment:   This is a routine wellness examination for Geisha.  Hearing/Vision screen Hearing Screening - Comments:: Denies hearing difficulties   Vision Screening - Comments:: Wears rx glasses - up to date with routine eye exams with  Dr Thom Hamilton   Goals Addressed               This Visit's Progress     Increase physical activity (pt-stated)        Get more active       Depression Screen     01/03/2024    9:28 AM 12/30/2022   10:07 AM 01/01/2022    9:53 AM 08/12/2021    1:44 PM 12/31/2020   10:43 AM 12/31/2020   10:40 AM 12/18/2019    1:29 PM  PHQ 2/9 Scores  PHQ - 2 Score 0 0 0 0 0 0 0  PHQ- 9 Score    1   0    Fall Risk     01/03/2024    9:30 AM 12/30/2022   10:09 AM 01/01/2022    9:55 AM 08/12/2021    1:45 PM 12/31/2020   10:44 AM  Fall Risk   Falls in the past year? 0 0 0 0 0  Number falls in past yr: 0 0 0 0 0  Injury with Fall? 0 0 0 0 0  Risk for fall due to : No Fall Risks No Fall Risks No Fall Risks No Fall Risks   Follow up Falls evaluation completed Falls prevention  discussed Falls prevention discussed  Falls evaluation completed  Falls evaluation completed      Data saved with a previous flowsheet row definition    MEDICARE RISK AT HOME:  Medicare Risk at Home Any stairs in or around the home?: Yes If so, are there any without handrails?: No Home free of loose throw rugs in walkways, pet beds, electrical cords, etc?: Yes Adequate lighting in your home to reduce risk of falls?: Yes Life alert?: No Use of a cane, walker or w/c?: No Grab bars in the bathroom?: Yes Shower chair or bench in shower?: Yes Elevated toilet seat or a handicapped toilet?: No  TIMED UP AND GO:  Was the test performed?  No  Cognitive Function: 6CIT completed        01/03/2024    9:30 AM 12/30/2022   10:11 AM 01/01/2022    9:56 AM  6CIT Screen  What Year? 0 points 0 points 0 points  What month? 0 points 0 points 0 points  What time? 0 points 0 points 0 points  Count back from 20 0 points 0 points 0 points  Months in reverse 0 points 0 points 0 points  Repeat phrase 0 points 0 points 0 points  Total Score 0 points 0 points 0 points    Immunizations Immunization History  Administered Date(s) Administered   Fluad Quad(high Dose 65+) 12/15/2018, 12/19/2019, 12/18/2020, 12/16/2021   INFLUENZA, HIGH DOSE SEASONAL PF 01/11/2013, 12/07/2014, 12/06/2015, 12/02/2016, 12/05/2022, 12/02/2023   Influenza Split 01/01/2012   Influenza Whole 12/27/2007, 12/06/2010   Influenza,inj,Quad PF,6+ Mos 12/28/2013   Influenza-Unspecified 12/01/2016, 12/28/2016, 12/15/2018   PFIZER Comirnaty(Gray Top)Covid-19 Tri-Sucrose Vaccine 09/27/2020   PFIZER(Purple Top)SARS-COV-2 Vaccination 04/21/2019, 05/11/2019, 01/10/2020   Pneumococcal Conjugate-13 04/06/2006, 10/11/2013   Pneumococcal Polysaccharide-23 04/28/2016   Tdap 01/29/2017   Zoster Recombinant(Shingrix) 10/26/2018, 10/26/2018, 12/30/2018, 12/30/2018   Zoster, Live 03/28/2007    Screening Tests Health Maintenance  Topic Date  Due   FOOT EXAM  02/14/2023   COVID-19 Vaccine (5 - 2025-26 season) 12/06/2023   HEMOGLOBIN A1C  03/16/2024   Diabetic kidney evaluation - eGFR measurement  09/14/2024   Diabetic kidney evaluation - Urine ACR  09/14/2024   Colonoscopy  10/11/2024   OPHTHALMOLOGY EXAM  12/01/2024   Medicare Annual Wellness (AWV)  01/02/2025   DTaP/Tdap/Td (2 - Td or Tdap) 01/30/2027   Pneumococcal Vaccine: 50+ Years  Completed   Influenza Vaccine  Completed   DEXA SCAN  Completed   Zoster Vaccines- Shingrix  Completed   HPV VACCINES  Aged Out   Meningococcal B Vaccine  Aged Out    Health Maintenance Items Addressed:   Additional Screening:  Vision Screening: Recommended annual ophthalmology exams for early detection of glaucoma and other disorders of the eye. Is the patient up to  date with their annual eye exam?  Yes  Who is the provider or what is the name of the office in which the patient attends annual eye exams? Dr Thom Hamilton  Dental Screening: Recommended annual dental exams for proper oral hygiene  Community Resource Referral / Chronic Care Management: CRR required this visit?  No   CCM required this visit?  No   Plan:    I have personally reviewed and noted the following in the patient's chart:   Medical and social history Use of alcohol , tobacco or illicit drugs  Current medications and supplements including opioid prescriptions. Patient is not currently taking opioid prescriptions. Functional ability and status Nutritional status Physical activity Advanced directives List of other physicians Hospitalizations, surgeries, and ER visits in previous 12 months Vitals Screenings to include cognitive, depression, and falls Referrals and appointments  In addition, I have reviewed and discussed with patient certain preventive protocols, quality metrics, and best practice recommendations. A written personalized care plan for preventive services as well as general preventive health  recommendations were provided to patient.   Rojelio LELON Blush, LPN   [id number]   After Visit Summary: (MyChart) Due to this being a telephonic visit, the after visit summary with patients personalized plan was offered to patient via MyChart   Notes: Nothing significant to report at this time.

## 2024-01-11 DIAGNOSIS — C44311 Basal cell carcinoma of skin of nose: Secondary | ICD-10-CM | POA: Diagnosis not present

## 2024-01-25 DIAGNOSIS — Z09 Encounter for follow-up examination after completed treatment for conditions other than malignant neoplasm: Secondary | ICD-10-CM | POA: Diagnosis not present

## 2024-01-25 DIAGNOSIS — L578 Other skin changes due to chronic exposure to nonionizing radiation: Secondary | ICD-10-CM | POA: Diagnosis not present

## 2024-01-25 DIAGNOSIS — Z872 Personal history of diseases of the skin and subcutaneous tissue: Secondary | ICD-10-CM | POA: Diagnosis not present

## 2024-01-25 DIAGNOSIS — L814 Other melanin hyperpigmentation: Secondary | ICD-10-CM | POA: Diagnosis not present

## 2024-01-25 DIAGNOSIS — Z85828 Personal history of other malignant neoplasm of skin: Secondary | ICD-10-CM | POA: Diagnosis not present

## 2024-01-25 DIAGNOSIS — D1801 Hemangioma of skin and subcutaneous tissue: Secondary | ICD-10-CM | POA: Diagnosis not present

## 2024-01-25 DIAGNOSIS — Z08 Encounter for follow-up examination after completed treatment for malignant neoplasm: Secondary | ICD-10-CM | POA: Diagnosis not present

## 2024-01-25 DIAGNOSIS — L570 Actinic keratosis: Secondary | ICD-10-CM | POA: Diagnosis not present

## 2024-01-25 DIAGNOSIS — L821 Other seborrheic keratosis: Secondary | ICD-10-CM | POA: Diagnosis not present

## 2024-01-25 DIAGNOSIS — L910 Hypertrophic scar: Secondary | ICD-10-CM | POA: Diagnosis not present

## 2024-02-20 ENCOUNTER — Other Ambulatory Visit: Payer: Self-pay | Admitting: Family Medicine

## 2024-02-20 DIAGNOSIS — I10 Essential (primary) hypertension: Secondary | ICD-10-CM

## 2024-03-17 ENCOUNTER — Ambulatory Visit: Admitting: Family Medicine

## 2024-03-17 VITALS — BP 128/64 | HR 67 | Temp 97.4°F | Wt 117.9 lb

## 2024-03-17 DIAGNOSIS — I358 Other nonrheumatic aortic valve disorders: Secondary | ICD-10-CM | POA: Insufficient documentation

## 2024-03-17 DIAGNOSIS — E119 Type 2 diabetes mellitus without complications: Secondary | ICD-10-CM

## 2024-03-17 DIAGNOSIS — I10 Essential (primary) hypertension: Secondary | ICD-10-CM | POA: Diagnosis not present

## 2024-03-17 DIAGNOSIS — E785 Hyperlipidemia, unspecified: Secondary | ICD-10-CM | POA: Diagnosis not present

## 2024-03-17 LAB — POCT GLYCOSYLATED HEMOGLOBIN (HGB A1C): Hemoglobin A1C: 6.6 % — AB (ref 4.0–5.6)

## 2024-03-17 NOTE — Patient Instructions (Signed)
 A1C stable at 6.6%  Let's plan on 6 month follow up.

## 2024-03-17 NOTE — Progress Notes (Unsigned)
 Established Patient Office Visit  Subjective   Patient ID: [name], female    DOB: 1940/08/11  Age: 83 y.o. MRN: [id number]  Chief Complaint  Patient presents with   Medical Management of Chronic Issues    HPI  {History (Optional):23778} [name] is seen today for routine chronic medical management.  She has history of hypertension, type 2 diabetes, hyperlipidemia.  Generally doing well though her sister recently passed away at age 65 pancreatic cancer.  She had had MI back in March.  Her sister had diabetes which was poorly controlled and she had end-stage renal disease among other chronic medical problems.  [name] is very health-conscious with regard to diet.  She has been able to manage her diabetes for years without medication.  She does take cinnamon and apple cider vinegar supplements.  Her regular medications include simvastatin  and lisinopril .  Blood pressure has been stable.  No recent dizziness.  She had full labs checked last summer which were stable.  Does have history of aortic murmur.  Last echo was 2014 with question bicuspid valve.  No stenosis.  She denies any dizziness, chest fullness with activity, or shortness of breath  Past Medical History:  Diagnosis Date   Broken femur (HCC)    Cancer (HCC) 2001   skin, nose   Cataract    Diabetes mellitus    Borderline/no meds   Diverticulosis of colon    DNR (do not resuscitate) 2009   GERD (gastroesophageal reflux disease)    Heart murmur    Hyperlipidemia    Hypertension    Kidney stones    1 time   Migraines    during menopause   Osteoporosis 09/2018   T score -2.7 stable from prior DEXA.   Pneumothorax on left    Past Surgical History:  Procedure Laterality Date   ABDOMINAL HYSTERECTOMY  1994   fibroids/ complete   BUNIONECTOMY  2004   left   CARPAL TUNNEL RELEASE  2000   right    CATARACT EXTRACTION     Bil   COLONOSCOPY  10/24/2013   FEMUR FRACTURE SURGERY     LUNG SURGERY  2008   left  lung VATS for biopsy and removal of blebs with pleurodesis   OOPHORECTOMY     BSO   POLYPECTOMY     TONSILLECTOMY     as child    reports that she has never smoked. She has never used smokeless tobacco. She reports that she does not drink alcohol  and does not use drugs. family history includes Cancer in her father, mother, and sister; Diabetes in her sister; Emphysema in her maternal aunt; Hyperlipidemia in her sister; Ovarian cancer in her sister; Pulmonary embolism in her father. Allergies[1]  Review of Systems  Constitutional:  Negative for malaise/fatigue.  Eyes:  Negative for blurred vision.  Respiratory:  Negative for shortness of breath.   Cardiovascular:  Negative for chest pain.  Neurological:  Negative for dizziness, weakness and headaches.      Objective:     BP 128/64   Pulse 67   Temp (!) 97.4 F (36.3 C) (Oral)   Wt 117 lb 14.4 oz (53.5 kg)   SpO2 97%   BMI 23.03 kg/m  BP Readings from Last 3 Encounters:  03/17/24 128/64  03/17/23 126/64  01/19/23 120/70   Wt Readings from Last 3 Encounters:  03/17/24 117 lb 14.4 oz (53.5 kg)  01/03/24 119 lb (54 kg)  03/17/23 119 lb 8 oz (  54.2 kg)      Physical Exam Vitals reviewed.  Constitutional:      Appearance: She is well-developed.  Eyes:     Pupils: Pupils are equal, round, and reactive to light.  Neck:     Thyroid : No thyromegaly.     Vascular: No JVD.  Cardiovascular:     Rate and Rhythm: Normal rate and regular rhythm.     Heart sounds: Murmur heard.     No gallop.     Comments: 2/6 systolic ejection murmur right upper sternal border Pulmonary:     Effort: Pulmonary effort is normal. No respiratory distress.     Breath sounds: Normal breath sounds. No wheezing or rales.  Musculoskeletal:     Cervical back: Neck supple.  Neurological:     Mental Status: She is alert.      Results for orders placed or performed in visit on 03/17/24  POC HgB A1c  Result Value Ref Range   Hemoglobin A1C 6.6  (A) 4.0 - 5.6 %   HbA1c POC (<> result, manual entry)     HbA1c, POC (prediabetic range)     HbA1c, POC (controlled diabetic range)      Last CBC Lab Results  Component Value Date   WBC 7.3 09/15/2023   HGB 14.2 09/15/2023   HCT 41.7 09/15/2023   MCV 96.5 09/15/2023   MCH 32.9 09/15/2023   RDW 12.4 09/15/2023   PLT 277 09/15/2023   Last metabolic panel Lab Results  Component Value Date   GLUCOSE 82 09/15/2023   NA 137 09/15/2023   K 4.5 09/15/2023   CL 102 09/15/2023   CO2 27 09/15/2023   BUN 13 09/15/2023   CREATININE 0.61 09/15/2023   EGFR 89 09/15/2023   CALCIUM  10.2 09/15/2023   PROT 6.4 09/15/2023   ALBUMIN 4.6 09/15/2022   BILITOT 0.7 09/15/2023   ALKPHOS 42 09/15/2022   AST 17 09/15/2023   ALT 14 09/15/2023   Last lipids Lab Results  Component Value Date   CHOL 157 09/15/2023   HDL 62 09/15/2023   LDLCALC 73 09/15/2023   LDLDIRECT 73.0 08/12/2021   TRIG 139 09/15/2023   CHOLHDL 2.5 09/15/2023   Last hemoglobin A1c Lab Results  Component Value Date   HGBA1C 6.6 (A) 03/17/2024      The ASCVD Risk score (Arnett DK, et al., 2019) failed to calculate for the following reasons:   The 2019 ASCVD risk score is only valid for ages 58 to 57   * - Cholesterol units were assumed    Assessment & Plan:   #1 type 2 diabetes stable with A1c 6.6%.  She has been on manage this for years with lifestyle.  Recheck A1c in 6 months  #2 hypertension stable on lisinopril  10 mg daily.  Continue current dosage  #3 aortic heart murmur.  Last echo 2014.  We discussed consideration for repeat echo.  At this point she is asymptomatic with no recent dizziness, chest pains, or shortness of breath.  We elected to observe for now but certainly get follow-up echo for any progressive symptoms  #4 hyperlipidemia treated with simvastatin  40 mg daily.  Recheck fasting lipid and CMP at follow-up   Return in about 6 months (around 09/15/2024).    Wolm Scarlet, MD     [1] No  Known Allergies

## 2024-04-11 ENCOUNTER — Other Ambulatory Visit: Payer: Self-pay | Admitting: Family Medicine

## 2024-09-15 ENCOUNTER — Ambulatory Visit: Admitting: Family Medicine

## 2025-01-08 ENCOUNTER — Ambulatory Visit
